# Patient Record
Sex: Female | Born: 1970
Health system: Southern US, Community
[De-identification: ages and names within clinical notes are randomized; demographics above are authoritative.]

## PROBLEM LIST (undated history)

## (undated) DIAGNOSIS — L409 Psoriasis, unspecified: Secondary | ICD-10-CM

## (undated) DIAGNOSIS — I10 Essential (primary) hypertension: Secondary | ICD-10-CM

## (undated) DIAGNOSIS — N938 Other specified abnormal uterine and vaginal bleeding: Secondary | ICD-10-CM

## (undated) DIAGNOSIS — D594 Other nonautoimmune hemolytic anemias: Secondary | ICD-10-CM

## (undated) DIAGNOSIS — R61 Generalized hyperhidrosis: Secondary | ICD-10-CM

## (undated) HISTORY — PX: WISDOM TOOTH EXTRACTION: SHX21

## (undated) HISTORY — DX: Other nonautoimmune hemolytic anemias: D59.4

## (undated) HISTORY — DX: Generalized hyperhidrosis: R61

## (undated) HISTORY — DX: Other specified abnormal uterine and vaginal bleeding: N93.8

## (undated) HISTORY — PX: BREAST EXCISIONAL BIOPSY: SUR124

## (undated) HISTORY — PX: BREAST BIOPSY: SHX20

## (undated) HISTORY — DX: Psoriasis, unspecified: L40.9

---

## 2000-03-11 ENCOUNTER — Other Ambulatory Visit: Admission: RE | Admit: 2000-03-11 | Discharge: 2000-03-11 | Payer: Self-pay | Admitting: Family Medicine

## 2002-02-11 ENCOUNTER — Other Ambulatory Visit: Admission: RE | Admit: 2002-02-11 | Discharge: 2002-02-11 | Payer: Self-pay | Admitting: Family Medicine

## 2002-02-12 ENCOUNTER — Encounter: Admission: RE | Admit: 2002-02-12 | Discharge: 2002-02-12 | Payer: Self-pay | Admitting: Family Medicine

## 2002-02-12 ENCOUNTER — Encounter: Payer: Self-pay | Admitting: Family Medicine

## 2002-02-23 ENCOUNTER — Encounter: Admission: RE | Admit: 2002-02-23 | Discharge: 2002-02-23 | Payer: Self-pay | Admitting: Family Medicine

## 2002-02-23 ENCOUNTER — Encounter: Payer: Self-pay | Admitting: Family Medicine

## 2003-05-27 ENCOUNTER — Other Ambulatory Visit: Admission: RE | Admit: 2003-05-27 | Discharge: 2003-05-27 | Payer: Self-pay | Admitting: Family Medicine

## 2004-06-11 ENCOUNTER — Other Ambulatory Visit: Admission: RE | Admit: 2004-06-11 | Discharge: 2004-06-11 | Payer: Self-pay | Admitting: Family Medicine

## 2004-10-15 ENCOUNTER — Ambulatory Visit: Payer: Self-pay | Admitting: Family Medicine

## 2005-12-24 ENCOUNTER — Ambulatory Visit: Payer: Self-pay | Admitting: Family Medicine

## 2005-12-31 ENCOUNTER — Encounter: Payer: Self-pay | Admitting: Family Medicine

## 2005-12-31 ENCOUNTER — Other Ambulatory Visit: Admission: RE | Admit: 2005-12-31 | Discharge: 2005-12-31 | Payer: Self-pay | Admitting: Family Medicine

## 2005-12-31 ENCOUNTER — Ambulatory Visit: Payer: Self-pay | Admitting: Family Medicine

## 2006-05-15 ENCOUNTER — Ambulatory Visit: Payer: Self-pay | Admitting: Family Medicine

## 2006-12-30 ENCOUNTER — Ambulatory Visit: Payer: Self-pay | Admitting: Family Medicine

## 2006-12-30 LAB — CONVERTED CEMR LAB
ALT: 13 units/L (ref 0–40)
AST: 18 units/L (ref 0–37)
Albumin: 4.1 g/dL (ref 3.5–5.2)
Alkaline Phosphatase: 52 units/L (ref 39–117)
BUN: 10 mg/dL (ref 6–23)
Basophils Absolute: 0 10*3/uL (ref 0.0–0.1)
Basophils Relative: 0.5 % (ref 0.0–1.0)
Bilirubin, Direct: 0.1 mg/dL (ref 0.0–0.3)
CO2: 27 meq/L (ref 19–32)
Calcium: 9.1 mg/dL (ref 8.4–10.5)
Chloride: 108 meq/L (ref 96–112)
Cholesterol: 212 mg/dL (ref 0–200)
Creatinine, Ser: 0.8 mg/dL (ref 0.4–1.2)
Direct LDL: 118.1 mg/dL
Eosinophils Absolute: 0.1 10*3/uL (ref 0.0–0.6)
Eosinophils Relative: 1.7 % (ref 0.0–5.0)
GFR calc Af Amer: 105 mL/min
GFR calc non Af Amer: 87 mL/min
Glucose, Bld: 101 mg/dL — ABNORMAL HIGH (ref 70–99)
HCT: 36.7 % (ref 36.0–46.0)
HDL: 76.2 mg/dL (ref >39.0)
Hemoglobin: 12.9 g/dL (ref 12.0–15.0)
Lymphocytes Relative: 37.3 % (ref 12.0–46.0)
MCHC: 35.2 g/dL (ref 30.0–36.0)
MCV: 93.3 fL (ref 78.0–100.0)
Monocytes Absolute: 0.5 10*3/uL (ref 0.2–0.7)
Monocytes Relative: 13.1 % — ABNORMAL HIGH (ref 3.0–11.0)
Neutro Abs: 2 10*3/uL (ref 1.4–7.7)
Neutrophils Relative %: 47.4 % (ref 43.0–77.0)
Platelets: 276 10*3/uL (ref 150–400)
Potassium: 3.5 meq/L (ref 3.5–5.1)
RBC: 3.94 M/uL (ref 3.87–5.11)
RDW: 12.6 % (ref 11.5–14.6)
Sodium: 143 meq/L (ref 135–145)
TSH: 1.55 microintl units/mL (ref 0.35–5.50)
Total Bilirubin: 0.9 mg/dL (ref 0.3–1.2)
Total CHOL/HDL Ratio: 2.8
Total Protein: 7.1 g/dL (ref 6.0–8.3)
Triglycerides: 51 mg/dL (ref 0–149)
VLDL: 10 mg/dL (ref 0–40)
WBC: 4.1 10*3/uL — ABNORMAL LOW (ref 4.5–10.5)

## 2007-01-14 ENCOUNTER — Other Ambulatory Visit: Admission: RE | Admit: 2007-01-14 | Discharge: 2007-01-14 | Payer: Self-pay | Admitting: Family Medicine

## 2007-01-14 ENCOUNTER — Encounter: Payer: Self-pay | Admitting: Family Medicine

## 2007-01-14 ENCOUNTER — Ambulatory Visit: Payer: Self-pay | Admitting: Family Medicine

## 2007-01-16 ENCOUNTER — Ambulatory Visit (HOSPITAL_COMMUNITY): Admission: RE | Admit: 2007-01-16 | Discharge: 2007-01-16 | Payer: Self-pay | Admitting: Family Medicine

## 2007-01-19 ENCOUNTER — Ambulatory Visit: Payer: Self-pay | Admitting: Family Medicine

## 2008-03-15 ENCOUNTER — Encounter: Payer: Self-pay | Admitting: Family Medicine

## 2008-03-15 ENCOUNTER — Ambulatory Visit: Payer: Self-pay | Admitting: Family Medicine

## 2008-03-15 ENCOUNTER — Other Ambulatory Visit: Admission: RE | Admit: 2008-03-15 | Discharge: 2008-03-15 | Payer: Self-pay | Admitting: Family Medicine

## 2008-03-15 DIAGNOSIS — N949 Unspecified condition associated with female genital organs and menstrual cycle: Secondary | ICD-10-CM | POA: Insufficient documentation

## 2008-03-15 DIAGNOSIS — N938 Other specified abnormal uterine and vaginal bleeding: Secondary | ICD-10-CM | POA: Insufficient documentation

## 2008-03-15 DIAGNOSIS — L258 Unspecified contact dermatitis due to other agents: Secondary | ICD-10-CM | POA: Insufficient documentation

## 2008-03-15 DIAGNOSIS — L308 Other specified dermatitis: Secondary | ICD-10-CM | POA: Insufficient documentation

## 2008-09-23 HISTORY — PX: ACHILLES TENDON REPAIR: SUR1153

## 2009-03-22 ENCOUNTER — Telehealth: Payer: Self-pay | Admitting: *Deleted

## 2009-12-26 ENCOUNTER — Ambulatory Visit: Payer: Self-pay | Admitting: Family Medicine

## 2009-12-26 LAB — CONVERTED CEMR LAB
ALT: 14 units/L (ref 0–35)
AST: 20 units/L (ref 0–37)
Albumin: 4.3 g/dL (ref 3.5–5.2)
Alkaline Phosphatase: 52 units/L (ref 39–117)
BUN: 12 mg/dL (ref 6–23)
Basophils Absolute: 0 10*3/uL (ref 0.0–0.1)
Basophils Relative: 0.8 % (ref 0.0–3.0)
Bilirubin Urine: NEGATIVE
Bilirubin, Direct: 0.1 mg/dL (ref 0.0–0.3)
Blood in Urine, dipstick: 1
CO2: 20 meq/L (ref 19–32)
Calcium: 9 mg/dL (ref 8.4–10.5)
Chloride: 105 meq/L (ref 96–112)
Cholesterol: 222 mg/dL — ABNORMAL HIGH (ref 0–200)
Creatinine, Ser: 0.8 mg/dL (ref 0.4–1.2)
Direct LDL: 137.5 mg/dL
Eosinophils Absolute: 0 10*3/uL (ref 0.0–0.7)
Eosinophils Relative: 0.9 % (ref 0.0–5.0)
GFR calc non Af Amer: 102.83 mL/min (ref >60)
Glucose, Bld: 78 mg/dL (ref 70–99)
Glucose, Urine, Semiquant: NEGATIVE
HCT: 30.4 % — ABNORMAL LOW (ref 36.0–46.0)
HDL: 86.8 mg/dL (ref >39.00)
Hemoglobin: 10.1 g/dL — ABNORMAL LOW (ref 12.0–15.0)
Lymphocytes Relative: 37.8 % (ref 12.0–46.0)
Lymphs Abs: 1.7 10*3/uL (ref 0.7–4.0)
MCHC: 33.2 g/dL (ref 30.0–36.0)
MCV: 82.8 fL (ref 78.0–100.0)
Monocytes Absolute: 0.4 10*3/uL (ref 0.1–1.0)
Monocytes Relative: 9.1 % (ref 3.0–12.0)
Neutro Abs: 2.4 10*3/uL (ref 1.4–7.7)
Neutrophils Relative %: 51.4 % (ref 43.0–77.0)
Nitrite: NEGATIVE
Platelets: 291 10*3/uL (ref 150.0–400.0)
Potassium: 3.9 meq/L (ref 3.5–5.1)
Protein, U semiquant: NEGATIVE
RBC: 3.68 M/uL — ABNORMAL LOW (ref 3.87–5.11)
RDW: 15.5 % — ABNORMAL HIGH (ref 11.5–14.6)
Sodium: 137 meq/L (ref 135–145)
Specific Gravity, Urine: 1.015
TSH: 1.34 microintl units/mL (ref 0.35–5.50)
Total Bilirubin: 0.5 mg/dL (ref 0.3–1.2)
Total CHOL/HDL Ratio: 3
Total Protein: 7.4 g/dL (ref 6.0–8.3)
Triglycerides: 52 mg/dL (ref 0.0–149.0)
Urobilinogen, UA: 0.2
VLDL: 10.4 mg/dL (ref 0.0–40.0)
WBC Urine, dipstick: NEGATIVE
WBC: 4.6 10*3/uL (ref 4.5–10.5)
pH: 7

## 2010-01-02 ENCOUNTER — Ambulatory Visit: Payer: Self-pay | Admitting: Family Medicine

## 2010-01-02 ENCOUNTER — Other Ambulatory Visit: Admission: RE | Admit: 2010-01-02 | Discharge: 2010-01-02 | Payer: Self-pay | Admitting: Family Medicine

## 2010-01-02 LAB — CONVERTED CEMR LAB: Pap Smear: NEGATIVE

## 2010-03-09 ENCOUNTER — Ambulatory Visit: Payer: Self-pay | Admitting: Family Medicine

## 2010-03-09 DIAGNOSIS — R439 Unspecified disturbances of smell and taste: Secondary | ICD-10-CM | POA: Insufficient documentation

## 2010-10-23 NOTE — Assessment & Plan Note (Signed)
Summary: ?allergies//ccm   Vital Signs:  Patient profile:   39 year old female Menstrual status:  regular Weight:      177 pounds Temp:     98.7 degrees F oral BP sitting:   130 / 90  (left arm) Cuff size:   regular  Vitals Entered By: Kern Reap CMA Duncan Dull) (March 09, 2010 11:44 AM) CC: loss of taste and smell   CC:  loss of taste and smell.  History of Present Illness: Joanna Wright is a 39 year old female, who comes in today with a one month history of the inability to smell anything.  A month ago, and she had a flareup in her allergies.  Allergy symptoms have stopped, however, she's not been able to smell anything for one month.  Review of systems negative  Allergies: No Known Drug Allergies  Past History:  Past medical, surgical, family and social histories (including risk factors) reviewed, and no changes noted (except as noted below).  Past Medical History: Reviewed history from 03/15/2008 and no changes required. MHA DUB Night Sweats torn Achilles' tendon, right ankle repair  Past Surgical History: Reviewed history from 06/17/2007 and no changes required. Denies surgical history  Family History: Reviewed history from 06/17/2007 and no changes required. Family History Hypertension  Social History: Reviewed history from 06/17/2007 and no changes required. Occupation: Married Never Smoked Alcohol use-no Drug use-no  Review of Systems      See HPI  Physical Exam  General:  Well-developed,well-nourished,in no acute distress; alert,appropriate and cooperative throughout examination Head:  Normocephalic and atraumatic without obvious abnormalities. No apparent alopecia or balding. Eyes:  No corneal or conjunctival inflammation noted. EOMI. Perrla. Funduscopic exam benign, without hemorrhages, exudates or papilledema. Vision grossly normal. Ears:  External ear exam shows no significant lesions or deformities.  Otoscopic examination reveals clear canals,  tympanic membranes are intact bilaterally without bulging, retraction, inflammation or discharge. Hearing is grossly normal bilaterally. Nose:  External nasal examination shows no deformity or inflammation. Nasal mucosa are pink and moist without lesions or exudates. Mouth:  Oral mucosa and oropharynx without lesions or exudates.  Teeth in good repair. Neck:  No deformities, masses, or tenderness noted.   Impression & Recommendations:  Problem # 1:  ANOSMIA (ICD-781.1) Assessment New  Complete Medication List: 1)  Lidex 0.05 % Crea (Fluocinonide) .... Apply at bedtime  Patient Instructions: 1)  wait for a couple weeks to see if this is not get better on its own.  If you would like a second opinion.  I would recommend Dr. Dillard Cannon ent

## 2010-10-23 NOTE — Assessment & Plan Note (Signed)
Summary: A. a   Vital Signs:  Patient profile:   40 year old female Menstrual status:  regular LMP:     12/23/2009 Height:      64 inches Weight:      179 pounds BMI:     30.84 Temp:     98.9 degrees F oral BP sitting:   110 / 80  (left arm) Cuff size:   regular  Vitals Entered By: Kern Reap CMA Duncan Dull) (January 02, 2010 4:00 PM) CC: cpx Is Patient Diabetic? No Pain Assessment Patient in pain? no      LMP (date): 12/23/2009     Menstrual Status regular Enter LMP: 12/23/2009   CC:  cpx.  History of Present Illness: Annmargaret is a 40 year old, married female, nonsmoker, who comes in today for general physical examination,  She's always been in excellent, health.  She's had no chronic health problems.  Her periods are regular but heavy.  Her birth control.  They use condoms.  Tetanus 2007 seasonal flu 2010  Allergies: No Known Drug Allergies  Past History:  Past medical, surgical, family and social histories (including risk factors) reviewed, and no changes noted (except as noted below).  Past Medical History: Reviewed history from 03/15/2008 and no changes required. MHA DUB Night Sweats torn Achilles' tendon, right ankle repair  Past Surgical History: Reviewed history from 06/17/2007 and no changes required. Denies surgical history  Family History: Reviewed history from 06/17/2007 and no changes required. Family History Hypertension  Social History: Reviewed history from 06/17/2007 and no changes required. Occupation: Married Never Smoked Alcohol use-no Drug use-no  Review of Systems      See HPI  Physical Exam  General:  Well-developed,well-nourished,in no acute distress; alert,appropriate and cooperative throughout examination Head:  Normocephalic and atraumatic without obvious abnormalities. No apparent alopecia or balding. Eyes:  No corneal or conjunctival inflammation noted. EOMI. Perrla. Funduscopic exam benign, without hemorrhages, exudates  or papilledema. Vision grossly normal. Ears:  External ear exam shows no significant lesions or deformities.  Otoscopic examination reveals clear canals, tympanic membranes are intact bilaterally without bulging, retraction, inflammation or discharge. Hearing is grossly normal bilaterally. Nose:  External nasal examination shows no deformity or inflammation. Nasal mucosa are pink and moist without lesions or exudates. Mouth:  Oral mucosa and oropharynx without lesions or exudates.  Teeth in good repair. Neck:  No deformities, masses, or tenderness noted. Chest Wall:  No deformities, masses, or tenderness noted. Breasts:  No mass, nodules, thickening, tenderness, bulging, retraction, inflamation, nipple discharge or skin changes noted.   Lungs:  Normal respiratory effort, chest expands symmetrically. Lungs are clear to auscultation, no crackles or wheezes. Heart:  Normal rate and regular rhythm. S1 and S2 normal without gallop, murmur, click, rub or other extra sounds. Abdomen:  Bowel sounds positive,abdomen soft and non-tender without masses, organomegaly or hernias noted. Genitalia:  Pelvic Exam:        External: normal female genitalia without lesions or masses        Vagina: normal without lesions or masses        Cervix: normal without lesions or masses        Adnexa: normal bimanual exam without masses or fullness        Uterus: normal by palpation        Pap smear: performed Msk:  No deformity or scoliosis noted of thoracic or lumbar spine.   Pulses:  R and L carotid,radial,femoral,dorsalis pedis and posterior tibial pulses are full and equal bilaterally  Extremities:  No clubbing, cyanosis, edema, or deformity noted with normal full range of motion of all joints.   Neurologic:  No cranial nerve deficits noted. Station and gait are normal. Plantar reflexes are down-going bilaterally. DTRs are symmetrical throughout. Sensory, motor and coordinative functions appear intact. Skin:  Intact  without suspicious lesions or rashes Cervical Nodes:  No lymphadenopathy noted Axillary Nodes:  No palpable lymphadenopathy Inguinal Nodes:  No significant adenopathy Psych:  Cognition and judgment appear intact. Alert and cooperative with normal attention span and concentration. No apparent delusions, illusions, hallucinations   Impression & Recommendations:  Problem # 1:  DYSFUNCTIONAL UTERINE BLEEDING (ICD-626.8) Assessment Deteriorated  Problem # 2:  Preventive Health Care (ICD-V70.0) Assessment: Comment Only  Complete Medication List: 1)  Lidex 0.05 % Crea (Fluocinonide) .... Apply at bedtime  Patient Instructions: 1)  take one iron tablet at bedtime for 3 months. 2)  Please schedule a follow-up appointment in 1 year.   Immunization History:  Influenza Immunization History:    Influenza:  historical (06/23/2009)

## 2011-06-13 ENCOUNTER — Other Ambulatory Visit (INDEPENDENT_AMBULATORY_CARE_PROVIDER_SITE_OTHER): Payer: 59

## 2011-06-13 DIAGNOSIS — Z0000 Encounter for general adult medical examination without abnormal findings: Secondary | ICD-10-CM

## 2011-06-13 LAB — POCT URINALYSIS DIPSTICK
Leukocytes, UA: NEGATIVE
Nitrite, UA: POSITIVE
Urobilinogen, UA: 0.2

## 2011-06-13 LAB — BASIC METABOLIC PANEL
CO2: 25 mEq/L (ref 19–32)
Calcium: 8.8 mg/dL (ref 8.4–10.5)
Creatinine, Ser: 0.8 mg/dL (ref 0.4–1.2)

## 2011-06-13 LAB — CBC WITH DIFFERENTIAL/PLATELET
Basophils Absolute: 0 10*3/uL (ref 0.0–0.1)
Basophils Relative: 0.9 % (ref 0.0–3.0)
Eosinophils Absolute: 0 10*3/uL (ref 0.0–0.7)
Lymphocytes Relative: 32.7 % (ref 12.0–46.0)
MCHC: 31.8 g/dL (ref 30.0–36.0)
Neutrophils Relative %: 56.4 % (ref 43.0–77.0)
RBC: 3.69 Mil/uL — ABNORMAL LOW (ref 3.87–5.11)

## 2011-06-13 LAB — HEPATIC FUNCTION PANEL
Alkaline Phosphatase: 44 U/L (ref 39–117)
Bilirubin, Direct: 0.1 mg/dL (ref 0.0–0.3)
Total Bilirubin: 0.5 mg/dL (ref 0.3–1.2)
Total Protein: 7.5 g/dL (ref 6.0–8.3)

## 2011-06-13 LAB — LIPID PANEL
HDL: 98.6 mg/dL (ref >39.00)
Total CHOL/HDL Ratio: 2
VLDL: 6.8 mg/dL (ref 0.0–40.0)

## 2011-06-13 LAB — TSH: TSH: 1.38 u[IU]/mL (ref 0.35–5.50)

## 2011-06-19 ENCOUNTER — Encounter: Payer: Self-pay | Admitting: Family Medicine

## 2011-06-20 ENCOUNTER — Ambulatory Visit (INDEPENDENT_AMBULATORY_CARE_PROVIDER_SITE_OTHER): Payer: 59 | Admitting: Family Medicine

## 2011-06-20 ENCOUNTER — Other Ambulatory Visit (HOSPITAL_COMMUNITY)
Admission: RE | Admit: 2011-06-20 | Discharge: 2011-06-20 | Disposition: A | Discharge status: Home / Self Care | Payer: 59 | Source: Ambulatory Visit | Attending: Family Medicine | Admitting: Family Medicine

## 2011-06-20 ENCOUNTER — Encounter: Payer: Self-pay | Admitting: Family Medicine

## 2011-06-20 DIAGNOSIS — Z01419 Encounter for gynecological examination (general) (routine) without abnormal findings: Secondary | ICD-10-CM | POA: Insufficient documentation

## 2011-06-20 DIAGNOSIS — N949 Unspecified condition associated with female genital organs and menstrual cycle: Secondary | ICD-10-CM

## 2011-06-20 DIAGNOSIS — N938 Other specified abnormal uterine and vaginal bleeding: Secondary | ICD-10-CM

## 2011-06-20 DIAGNOSIS — D649 Anemia, unspecified: Secondary | ICD-10-CM

## 2011-06-20 DIAGNOSIS — Z0001 Encounter for general adult medical examination with abnormal findings: Secondary | ICD-10-CM | POA: Insufficient documentation

## 2011-06-20 DIAGNOSIS — Z0000 Encounter for general adult medical examination without abnormal findings: Secondary | ICD-10-CM | POA: Insufficient documentation

## 2011-06-20 MED ORDER — ETHYNODIOL DIAC-ETH ESTRADIOL 1-50 MG-MCG PO TABS: 1.0000 | ORAL_TABLET | Freq: Every day | ORAL | Status: DC

## 2011-06-20 NOTE — Progress Notes (Signed)
  Subjective:    Patient ID: Joanna Wright, female    DOB: [birth year], 40 y.o.   MRN: [id number]  HPISharma is a delightful 40 year old female, G0, P0, who comes in today for general physical examination she is also a nonsmoker.  She's always been in excellent, health.  She's had no chronic health problems.  She has routine eye care, dental care, BSE mont......... She's never had a mammogram.  Family history negative.  Tetanus booster 2007,  LMP 910, normal birth control ...........so uses condoms    Review of Systems  Constitutional: Negative.   HENT: Negative.   Eyes: Negative.   Respiratory: Negative.   Cardiovascular: Negative.   Gastrointestinal: Negative.   Genitourinary: Negative.   Musculoskeletal: Negative.   Neurological: Negative.   Hematological: Negative.   Psychiatric/Behavioral: Negative.        Objective:   Physical Exam  Constitutional: She appears well-developed and well-nourished.  HENT:  Head: Normocephalic and atraumatic.  Right Ear: External ear normal.  Left Ear: External ear normal.  Nose: Nose normal.  Mouth/Throat: Oropharynx is clear and moist.  Eyes: EOM are normal. Pupils are equal, round, and reactive to light.  Neck: Normal range of motion. Neck supple. No thyromegaly present.  Cardiovascular: Normal rate, regular rhythm, normal heart sounds and intact distal pulses.  Exam reveals no gallop and no friction rub.   No murmur heard. Pulmonary/Chest: Effort normal and breath sounds normal.  Abdominal: Soft. Bowel sounds are normal. She exhibits no distension and no mass. There is no tenderness. There is no rebound.  Genitourinary: Vagina normal and uterus normal. Guaiac negative stool. No vaginal discharge found.       Bilateral breast exam normal  Musculoskeletal: Normal range of motion.  Lymphadenopathy:    She has no cervical adenopathy.  Neurological: She is alert. She has normal reflexes. No cranial nerve deficit. She exhibits normal  muscle tone. Coordination normal.  Skin: Skin is warm and dry.  Psychiatric: She has a normal mood and affect. Her behavior is normal. Judgment and thought content normal.          Assessment & Plan:  Healthy female return yearly sooner if any problems.  Recommend BSE monthly and a new mammography

## 2011-06-20 NOTE — Patient Instructions (Addendum)
Continued good health habits.  Call to schedule your first mammogram.  Return one year For your annual physical examination  Start the BCPs denied.  Take one iron tablet nightly at bedtime for 3 months.  Return in 3 months for follow-up

## 2011-09-26 ENCOUNTER — Ambulatory Visit (INDEPENDENT_AMBULATORY_CARE_PROVIDER_SITE_OTHER): Payer: 59 | Admitting: Family Medicine

## 2011-09-26 ENCOUNTER — Encounter: Payer: Self-pay | Admitting: Family Medicine

## 2011-09-26 DIAGNOSIS — N949 Unspecified condition associated with female genital organs and menstrual cycle: Secondary | ICD-10-CM

## 2011-09-26 NOTE — Progress Notes (Signed)
  Subjective:    Patient ID: Joanna Wright, female    DOB: 1971-09-22, 41 y.o.   MRN: [id number]  HPI Joanna Wright Is a 41 year old female, who comes in today for follow-up of anemia.  We saw her in September for a physical examination at that time.  Her hemoglobin was 9.5.  She had a history of dysfunction uterine bleeding.  Therefore, we start her on low dose birth control pills and iron supplementation.  However, she only took one pill a day for a month and stopped it.  Hemoglobin today up to 10.  No side effects from the BCPs   Review of Systems    General in GYN review of systems otherwise negative Objective:   Physical Exam  Well-developed well-nourished, in no acute distress      Assessment & Plan:  PUD with anemia.  Plan continue BCPs one iron tablet nightly at bedtime for two months recheck CBC in two months

## 2011-09-26 NOTE — Patient Instructions (Signed)
Take one iron tablet at bedtime for two months.  Return in two months for a nonfasting CBC.  I will call you the report

## 2011-11-18 ENCOUNTER — Other Ambulatory Visit (INDEPENDENT_AMBULATORY_CARE_PROVIDER_SITE_OTHER): Payer: 59

## 2011-11-18 DIAGNOSIS — N939 Abnormal uterine and vaginal bleeding, unspecified: Secondary | ICD-10-CM

## 2011-11-18 DIAGNOSIS — N926 Irregular menstruation, unspecified: Secondary | ICD-10-CM

## 2011-11-18 DIAGNOSIS — N949 Unspecified condition associated with female genital organs and menstrual cycle: Secondary | ICD-10-CM

## 2011-11-18 LAB — CBC WITH DIFFERENTIAL/PLATELET
Basophils Absolute: 0 10*3/uL (ref 0.0–0.1)
Eosinophils Absolute: 0.1 10*3/uL (ref 0.0–0.7)
Lymphocytes Relative: 31.9 % (ref 12.0–46.0)
MCHC: 32.7 g/dL (ref 30.0–36.0)
Neutro Abs: 2.6 10*3/uL (ref 1.4–7.7)
Neutrophils Relative %: 54.5 % (ref 43.0–77.0)
RDW: 17.3 % — ABNORMAL HIGH (ref 11.5–14.6)

## 2011-11-25 ENCOUNTER — Ambulatory Visit: Payer: 59 | Admitting: Family Medicine

## 2011-11-28 ENCOUNTER — Ambulatory Visit (INDEPENDENT_AMBULATORY_CARE_PROVIDER_SITE_OTHER): Payer: 59 | Admitting: Family Medicine

## 2011-11-28 ENCOUNTER — Encounter: Payer: Self-pay | Admitting: Family Medicine

## 2011-11-28 VITALS — BP 120/80 | Temp 98.9°F | Wt 169.0 lb

## 2011-11-28 DIAGNOSIS — N949 Unspecified condition associated with female genital organs and menstrual cycle: Secondary | ICD-10-CM

## 2011-11-28 DIAGNOSIS — N939 Abnormal uterine and vaginal bleeding, unspecified: Secondary | ICD-10-CM

## 2011-11-28 NOTE — Progress Notes (Signed)
  Subjective:    Patient ID: Joanna Wright, female    DOB: 04/06/1971, 41 y.o.   MRN: [id number]  HPI Joanna Wright is a 41 year old female nonsmoker who comes in today for followup of dysfunction uterine bleeding with anemia  We saw her 6 months ago at that time her hemoglobin was 9. She is having extremely heavy periods we started him on BCPs and iron today she comes back for followup saying she feels much better. Her hemoglobin now is 11.2 periods now last 3-4 days no side effects from the medication but pressure normal no breast tenderness etc.   Review of Systems General and GYN review of systems otherwise negative    Objective:   Physical Exam Well-developed well-nourished female in no acute distress       Assessment & Plan:  Dysfunction uterine bleeding with anemia controlled with BCPs continue BCPs followup 6 months

## 2011-11-28 NOTE — Patient Instructions (Signed)
Continue your current medication  Set up an appointment in September for your annual exam labs one week prior

## 2012-05-13 ENCOUNTER — Other Ambulatory Visit: Payer: Self-pay | Admitting: Family Medicine

## 2012-06-15 ENCOUNTER — Other Ambulatory Visit: Payer: 59

## 2012-06-22 ENCOUNTER — Other Ambulatory Visit (HOSPITAL_COMMUNITY)
Admission: RE | Admit: 2012-06-22 | Discharge: 2012-06-22 | Disposition: A | Discharge status: Home / Self Care | Payer: 59 | Source: Ambulatory Visit | Attending: Family Medicine | Admitting: Family Medicine

## 2012-06-22 ENCOUNTER — Encounter: Payer: Self-pay | Admitting: Family Medicine

## 2012-06-22 ENCOUNTER — Ambulatory Visit (INDEPENDENT_AMBULATORY_CARE_PROVIDER_SITE_OTHER): Payer: 59 | Admitting: Family Medicine

## 2012-06-22 VITALS — BP 124/84 | Temp 98.3°F | Ht 63.5 in | Wt 173.0 lb

## 2012-06-22 DIAGNOSIS — Z01419 Encounter for gynecological examination (general) (routine) without abnormal findings: Secondary | ICD-10-CM | POA: Insufficient documentation

## 2012-06-22 DIAGNOSIS — L409 Psoriasis, unspecified: Secondary | ICD-10-CM

## 2012-06-22 DIAGNOSIS — N949 Unspecified condition associated with female genital organs and menstrual cycle: Secondary | ICD-10-CM

## 2012-06-22 DIAGNOSIS — L408 Other psoriasis: Secondary | ICD-10-CM

## 2012-06-22 DIAGNOSIS — N925 Other specified irregular menstruation: Secondary | ICD-10-CM

## 2012-06-22 DIAGNOSIS — Z0000 Encounter for general adult medical examination without abnormal findings: Secondary | ICD-10-CM

## 2012-06-22 LAB — CBC WITH DIFFERENTIAL/PLATELET
Basophils Absolute: 0 10*3/uL (ref 0.0–0.1)
Eosinophils Absolute: 0.1 10*3/uL (ref 0.0–0.7)
Eosinophils Relative: 2 % (ref 0.0–5.0)
HCT: 36 % (ref 36.0–46.0)
Hemoglobin: 11.8 g/dL — ABNORMAL LOW (ref 12.0–15.0)
Lymphs Abs: 1.5 10*3/uL (ref 0.7–4.0)
MCHC: 32.8 g/dL (ref 30.0–36.0)
MCV: 92.6 fl (ref 78.0–100.0)
Monocytes Absolute: 0.5 10*3/uL (ref 0.1–1.0)
Neutrophils Relative %: 55.7 % (ref 43.0–77.0)
Platelets: 281 10*3/uL (ref 150.0–400.0)
RBC: 3.89 Mil/uL (ref 3.87–5.11)
RDW: 13.8 % (ref 11.5–14.6)

## 2012-06-22 LAB — TSH: TSH: 1 u[IU]/mL (ref 0.35–5.50)

## 2012-06-22 LAB — POCT URINALYSIS DIPSTICK
Ketones, UA: NEGATIVE
Leukocytes, UA: NEGATIVE
Nitrite, UA: NEGATIVE
Protein, UA: NEGATIVE
Urobilinogen, UA: 0.2
pH, UA: 7.5

## 2012-06-22 LAB — BASIC METABOLIC PANEL
BUN: 12 mg/dL (ref 6–23)
Calcium: 8.5 mg/dL (ref 8.4–10.5)
Chloride: 106 mEq/L (ref 96–112)
Creatinine, Ser: 0.8 mg/dL (ref 0.4–1.2)

## 2012-06-22 MED ORDER — CALCIPOTRIENE 0.005 % EX SOLN: 1.0000 "application " | Freq: Two times a day (BID) | CUTANEOUS | Status: DC

## 2012-06-22 MED ORDER — ETHYNODIOL DIAC-ETH ESTRADIOL 1-50 MG-MCG PO TABS: ORAL_TABLET | ORAL | Status: DC

## 2012-06-22 MED ORDER — FLUOCINONIDE 0.05 % EX CREA: TOPICAL_CREAM | Freq: Two times a day (BID) | CUTANEOUS | Status: DC

## 2012-06-22 NOTE — Progress Notes (Signed)
  Subjective:    Patient ID: Joanna Wright, female    DOB: 16-Jul-1971, 41 y.o.   MRN: [id number]  HPI Windy is a 41 year old married female nonsmoker who comes in today for general physical examination because of a history of dysfunction uterine bleeding and psoriasis  LMP 923 normal. She's on BCPs. She gets routine eye care yearly because her father has a history of glaucoma, regular dental care, BSE monthly, never had a mammogram and instructed her on BSE and to call and get Center for her first mammogram.  She's been seeing a dermatologist for evaluation and treatment of psoriasis. They've been using a combination of Dovonex and a steroid scalp solution. The psoriasis is confined her scalp although she does have some hypopigmentation of the skin around her labia   Review of Systems  Constitutional: Negative.   HENT: Negative.   Eyes: Negative.   Respiratory: Negative.   Cardiovascular: Negative.   Gastrointestinal: Negative.   Genitourinary: Negative.   Musculoskeletal: Negative.   Neurological: Negative.   Hematological: Negative.   Psychiatric/Behavioral: Negative.        Objective:   Physical Exam  Constitutional: She appears well-developed and well-nourished.  HENT:  Head: Normocephalic and atraumatic.  Right Ear: External ear normal.  Left Ear: External ear normal.  Nose: Nose normal.  Mouth/Throat: Oropharynx is clear and moist.  Eyes: EOM are normal. Pupils are equal, round, and reactive to light.  Neck: Normal range of motion. Neck supple. No thyromegaly present.  Cardiovascular: Normal rate, regular rhythm, normal heart sounds and intact distal pulses.  Exam reveals no gallop and no friction rub.   No murmur heard. Pulmonary/Chest: Effort normal and breath sounds normal.  Abdominal: Soft. Bowel sounds are normal. She exhibits no distension and no mass. There is no tenderness. There is no rebound.  Genitourinary: Vagina normal and uterus normal. Guaiac negative  stool. No vaginal discharge found.       Bilateral breast exam normal  Musculoskeletal: Normal range of motion.  Lymphadenopathy:    She has no cervical adenopathy.  Neurological: She is alert. She has normal reflexes. No cranial nerve deficit. She exhibits normal muscle tone. Coordination normal.  Skin: Skin is warm and dry.  Psychiatric: She has a normal mood and affect. Her behavior is normal. Judgment and thought content normal.          Assessment & Plan:  Healthy female  Dysfunction uterine bleeding continue BCPs  Psoriasis of the scalp Dovonex scalp solution and Lidex

## 2012-06-22 NOTE — Patient Instructions (Signed)
Use small amounts of the Dovonex and the steroid cream when necessary for your scalp irritation  Differential thorough breast exam monthly and get set up for your first mammogram  Return in one year sooner if any problems

## 2012-06-25 ENCOUNTER — Telehealth: Payer: Self-pay | Admitting: Family Medicine

## 2012-06-25 NOTE — Telephone Encounter (Signed)
Left message on machine with lab results. 

## 2012-06-25 NOTE — Telephone Encounter (Signed)
Caller: [name]/Patient; Patient Name: Joanna Wright; PCP: Kelle Darting [facility]); Best Callback Phone Number: [phone] Pt states she is returning a call from the office but she could not hear the name and not sure what it was about. Message sent. No notes seen in EPIC

## 2012-10-30 ENCOUNTER — Other Ambulatory Visit: Payer: Self-pay | Admitting: Family Medicine

## 2012-10-30 DIAGNOSIS — Z1231 Encounter for screening mammogram for malignant neoplasm of breast: Secondary | ICD-10-CM

## 2012-11-16 ENCOUNTER — Ambulatory Visit (HOSPITAL_BASED_OUTPATIENT_CLINIC_OR_DEPARTMENT_OTHER)
Admission: RE | Admit: 2012-11-16 | Discharge: 2012-11-16 | Disposition: A | Discharge status: Home / Self Care | Payer: 59 | Source: Ambulatory Visit | Attending: Family Medicine | Admitting: Family Medicine

## 2012-11-16 DIAGNOSIS — Z1231 Encounter for screening mammogram for malignant neoplasm of breast: Secondary | ICD-10-CM

## 2012-11-18 ENCOUNTER — Other Ambulatory Visit: Payer: Self-pay | Admitting: Family Medicine

## 2012-11-18 DIAGNOSIS — R928 Other abnormal and inconclusive findings on diagnostic imaging of breast: Secondary | ICD-10-CM

## 2012-11-30 ENCOUNTER — Ambulatory Visit
Admission: RE | Admit: 2012-11-30 | Discharge: 2012-11-30 | Disposition: A | Discharge status: Home / Self Care | Payer: 59 | Source: Ambulatory Visit | Attending: Family Medicine | Admitting: Family Medicine

## 2012-11-30 DIAGNOSIS — R928 Other abnormal and inconclusive findings on diagnostic imaging of breast: Secondary | ICD-10-CM

## 2013-05-24 ENCOUNTER — Other Ambulatory Visit: Payer: Self-pay | Admitting: Family Medicine

## 2014-02-23 ENCOUNTER — Other Ambulatory Visit (HOSPITAL_COMMUNITY)
Admission: RE | Admit: 2014-02-23 | Discharge: 2014-02-23 | Disposition: A | Discharge status: Home / Self Care | Payer: PRIVATE HEALTH INSURANCE | Source: Ambulatory Visit | Attending: Family Medicine | Admitting: Family Medicine

## 2014-02-23 ENCOUNTER — Ambulatory Visit (INDEPENDENT_AMBULATORY_CARE_PROVIDER_SITE_OTHER): Payer: PRIVATE HEALTH INSURANCE | Admitting: Family Medicine

## 2014-02-23 ENCOUNTER — Encounter: Payer: Self-pay | Admitting: Family Medicine

## 2014-02-23 ENCOUNTER — Other Ambulatory Visit: Payer: Self-pay | Admitting: Family Medicine

## 2014-02-23 VITALS — BP 140/90 | Temp 98.5°F | Ht 63.5 in | Wt 194.0 lb

## 2014-02-23 DIAGNOSIS — Z0000 Encounter for general adult medical examination without abnormal findings: Secondary | ICD-10-CM

## 2014-02-23 DIAGNOSIS — Z01419 Encounter for gynecological examination (general) (routine) without abnormal findings: Secondary | ICD-10-CM | POA: Insufficient documentation

## 2014-02-23 DIAGNOSIS — N949 Unspecified condition associated with female genital organs and menstrual cycle: Secondary | ICD-10-CM

## 2014-02-23 DIAGNOSIS — N852 Hypertrophy of uterus: Secondary | ICD-10-CM

## 2014-02-23 DIAGNOSIS — L409 Psoriasis, unspecified: Secondary | ICD-10-CM

## 2014-02-23 DIAGNOSIS — N925 Other specified irregular menstruation: Secondary | ICD-10-CM

## 2014-02-23 DIAGNOSIS — L408 Other psoriasis: Secondary | ICD-10-CM

## 2014-02-23 DIAGNOSIS — N938 Other specified abnormal uterine and vaginal bleeding: Secondary | ICD-10-CM

## 2014-02-23 LAB — CBC WITH DIFFERENTIAL/PLATELET
BASOS PCT: 0.7 % (ref 0.0–3.0)
Basophils Absolute: 0 10*3/uL (ref 0.0–0.1)
EOS ABS: 0.1 10*3/uL (ref 0.0–0.7)
Eosinophils Relative: 1.7 % (ref 0.0–5.0)
HEMATOCRIT: 30.9 % — AB (ref 36.0–46.0)
HEMOGLOBIN: 10.1 g/dL — AB (ref 12.0–15.0)
LYMPHS ABS: 1.2 10*3/uL (ref 0.7–4.0)
LYMPHS PCT: 31.5 % (ref 12.0–46.0)
MCHC: 32.5 g/dL (ref 30.0–36.0)
MCV: 82.3 fl (ref 78.0–100.0)
Monocytes Absolute: 0.4 10*3/uL (ref 0.1–1.0)
Monocytes Relative: 9.2 % (ref 3.0–12.0)
NEUTROS ABS: 2.2 10*3/uL (ref 1.4–7.7)
Neutrophils Relative %: 56.9 % (ref 43.0–77.0)
Platelets: 386 10*3/uL (ref 150.0–400.0)
RBC: 3.76 Mil/uL — AB (ref 3.87–5.11)
RDW: 16.3 % — AB (ref 11.5–15.5)
WBC: 3.9 10*3/uL — ABNORMAL LOW (ref 4.0–10.5)

## 2014-02-23 LAB — TSH: TSH: 1.12 u[IU]/mL (ref 0.35–4.50)

## 2014-02-23 LAB — BASIC METABOLIC PANEL
BUN: 10 mg/dL (ref 6–23)
CALCIUM: 9 mg/dL (ref 8.4–10.5)
CO2: 27 mEq/L (ref 19–32)
Chloride: 104 mEq/L (ref 96–112)
Creatinine, Ser: 0.7 mg/dL (ref 0.4–1.2)
GFR: 121.5 mL/min (ref >60.00)
Glucose, Bld: 84 mg/dL (ref 70–99)
Potassium: 4 mEq/L (ref 3.5–5.1)
SODIUM: 137 meq/L (ref 135–145)

## 2014-02-23 LAB — POCT URINALYSIS DIPSTICK
BILIRUBIN UA: NEGATIVE
GLUCOSE UA: NEGATIVE
Ketones, UA: NEGATIVE
Nitrite, UA: POSITIVE
PH UA: 7
Protein, UA: NEGATIVE
SPEC GRAV UA: 1.015
UROBILINOGEN UA: 0.2

## 2014-02-23 MED ORDER — FLUOCINONIDE 0.05 % EX CREA: TOPICAL_CREAM | Freq: Two times a day (BID) | CUTANEOUS | Status: DC

## 2014-02-23 MED ORDER — ETHYNODIOL DIAC-ETH ESTRADIOL 1-50 MG-MCG PO TABS: ORAL_TABLET | ORAL | Status: DC

## 2014-02-23 NOTE — Patient Instructions (Signed)
Continue the condoms for birth control  We will get you set up for an ultrasound  I will call you the report  Restart BCPs when necessary

## 2014-02-23 NOTE — Progress Notes (Signed)
   Subjective:    Patient ID: Joanna Wright, female    DOB: 1970-11-29, 43 y.o.   MRN: [id number]  HPI Lindsay is a 43 year old female separated x2 years,,,,,, nonsmoker,,,,,,, who comes in today for general physical examination  She has a history of dysfunction uterine bleeding now she spotting a week before her period starts. LMP May 26 of this first. She's off her BCPs she and her new boyfriend are using condoms.  She gets routine eye care, dental care, BSE monthly, and you mammography, colonoscopy at age 33. No family history of colon cancer or polyps. She uses a steroid gel when necessary for his mom amounts of psoriasis of her scalp.  She was told on her last mammogram she had dense breast tissue advise 3-D mammography in the future.  She works does not exercise on a regular basis   Review of Systems  Constitutional: Negative.   HENT: Negative.   Eyes: Negative.   Respiratory: Negative.   Cardiovascular: Negative.   Gastrointestinal: Negative.   Genitourinary: Negative.   Musculoskeletal: Negative.   Neurological: Negative.   Psychiatric/Behavioral: Negative.        Objective:   Physical Exam  Constitutional: She appears well-developed and well-nourished.  HENT:  Head: Normocephalic and atraumatic.  Right Ear: External ear normal.  Left Ear: External ear normal.  Nose: Nose normal.  Mouth/Throat: Oropharynx is clear and moist.  Eyes: EOM are normal. Pupils are equal, round, and reactive to light.  Neck: Normal range of motion. Neck supple. No thyromegaly present.  Cardiovascular: Normal rate, regular rhythm, normal heart sounds and intact distal pulses.  Exam reveals no gallop and no friction rub.   No murmur heard. Pulmonary/Chest: Effort normal and breath sounds normal.  Abdominal: Soft. Bowel sounds are normal. She exhibits no distension and no mass. There is no tenderness. There is no rebound.  Genitourinary: Vagina normal. Guaiac negative stool. No vaginal  discharge found.  Bilateral breast exam normal  Uterus enlarged question fibroids ultrasound pending  Musculoskeletal: Normal range of motion.  Lymphadenopathy:    She has no cervical adenopathy.  Neurological: She is alert. She has normal reflexes. No cranial nerve deficit. She exhibits normal muscle tone. Coordination normal.  Skin: Skin is warm and dry.  Psychiatric: She has a normal mood and affect. Her behavior is normal. Judgment and thought content normal.          Assessment & Plan:  Healthy female  Dysfunction uterine bleeding with enlarged uterus question fibroids,,,,,,,, diagnostic ultrasound  This psoriasis is scalp Lidex when necessary,

## 2014-02-23 NOTE — Progress Notes (Signed)
Pre visit review using our clinic review tool, if applicable. No additional management support is needed unless otherwise documented below in the visit note. 

## 2014-02-24 LAB — CYTOLOGY - PAP

## 2014-02-25 ENCOUNTER — Other Ambulatory Visit: Payer: Self-pay | Admitting: Family Medicine

## 2014-02-25 DIAGNOSIS — D649 Anemia, unspecified: Secondary | ICD-10-CM

## 2014-02-28 ENCOUNTER — Ambulatory Visit
Admission: RE | Admit: 2014-02-28 | Discharge: 2014-02-28 | Disposition: A | Discharge status: Home / Self Care | Payer: PRIVATE HEALTH INSURANCE | Source: Ambulatory Visit | Attending: Family Medicine | Admitting: Family Medicine

## 2014-02-28 DIAGNOSIS — N852 Hypertrophy of uterus: Secondary | ICD-10-CM

## 2014-02-28 DIAGNOSIS — N949 Unspecified condition associated with female genital organs and menstrual cycle: Secondary | ICD-10-CM

## 2014-02-28 DIAGNOSIS — N925 Other specified irregular menstruation: Secondary | ICD-10-CM

## 2014-02-28 DIAGNOSIS — N938 Other specified abnormal uterine and vaginal bleeding: Secondary | ICD-10-CM

## 2014-03-02 ENCOUNTER — Other Ambulatory Visit: Payer: Self-pay | Admitting: Family Medicine

## 2014-03-02 DIAGNOSIS — R935 Abnormal findings on diagnostic imaging of other abdominal regions, including retroperitoneum: Secondary | ICD-10-CM

## 2014-03-22 ENCOUNTER — Ambulatory Visit (INDEPENDENT_AMBULATORY_CARE_PROVIDER_SITE_OTHER): Payer: PRIVATE HEALTH INSURANCE | Admitting: Internal Medicine

## 2014-03-22 VITALS — BP 126/94 | HR 85 | Temp 98.6°F | Resp 16 | Ht 63.5 in | Wt 192.1 lb

## 2014-03-22 DIAGNOSIS — M25539 Pain in unspecified wrist: Secondary | ICD-10-CM

## 2014-03-22 DIAGNOSIS — M25532 Pain in left wrist: Secondary | ICD-10-CM

## 2014-03-22 MED ORDER — MELOXICAM 15 MG PO TABS: 15.0000 mg | ORAL_TABLET | Freq: Every day | ORAL | Status: DC

## 2014-03-22 NOTE — Progress Notes (Signed)
°  This chart was scribed for Joanna Lin, MD by Eston Mould, ED Scribe. This patient was seen in room Room/bed 5 and the patient's care was started at 9:00 PM. Subjective:    Patient ID: Joanna Wright, female    DOB: Jul 19, 1971, 43 y.o.   MRN: [id number] Chief Complaint  Patient presents with   Hand Injury    Left hand   Hand Injury    HPI Comments: Joanna Wright is a 43 y.o. female who presents to the Emergency Department complaining of L hand pain secondary to an injury. Pt states she has been having pain and moderate swelling to L hand; painful when opening a door, bottle, squeezing a fist or excess activity. She states she was playing with her fiance 1 week ago and he landed on top of her L hand. She is L hand dominant.  Patient Active Problem List   Diagnosis Date Noted   Psoriasis of scalp 06/22/2012   Routine general medical examination at a health care facility 06/20/2011   ANOSMIA 03/09/2010   DYSFUNCTIONAL UTERINE BLEEDING 03/15/2008   CONTACT DERMATITIS&OTH ECZEMA DUE OTH [facility] AGENT 03/15/2008   Current outpatient prescriptions:fluocinonide cream (LIDEX) 0.05 %, Apply topically 2 (two) times daily. Apply small amounts each bedtime, Disp: 60 g, Rfl: 6  Review of Systems  Musculoskeletal: Positive for myalgias.       L hand pain  Skin: Negative for color change.   Objective:   Physical Exam  Nursing note and vitals reviewed. Constitutional: She is oriented to person, place, and time. She appears well-developed and well-nourished. No distress.  HENT:  Head: Normocephalic.  Eyes: EOM are normal. Pupils are equal, round, and reactive to light.  Neck: Neck supple.  Cardiovascular: Normal rate.   Pulmonary/Chest: Effort normal.  Musculoskeletal:  No swelling of the L wrist or hand. No ecchymosis. The thumb has a full ROM without pain. Tender over the extensor tendons in carpal area 2 and 5. Pain with grip. Mild pain in the wrist with extension.    Neurological: She is alert and oriented to person, place, and time.  Psychiatric: She has a normal mood and affect. Her behavior is normal. Thought content normal.   Triage Vitals:BP 126/94   Pulse 85   Temp(Src) 98.6 F (37 C) (Oral)   Resp 16   Ht 5' 3.5" (1.613 m)   Wt 192 lb 2 oz (87.147 kg)   BMI 33.50 kg/m2   SpO2 100%   LMP 03/15/2014 Assessment & Plan:    I have completed the patient encounter in its entirety as documented by the scribe, with editing by me where necessary. Robert P. Laney Pastor, M.D. Wrist pain, acute, left  this contusion injury resulted in swelling that has caused tendinitis in her pain Meds ordered this encounter  Medications   meloxicam (MOBIC) 15 MG tablet    Sig: Take 1 tablet (15 mg total) by mouth daily.    Dispense:  30 tablet    Refill:  0   wrist splint Range of motion exercises Recheck 2 weeks if not well

## 2014-05-13 NOTE — Patient Instructions (Addendum)
   Your procedure is scheduled on:  Thursday, August 27  Enter through the Main Entrance of [facility] at:  6 AM Pick up the phone at the desk and dial [phone] and inform us of your arrival.  Please call this number if you have any problems the morning of surgery: [phone]  Remember: Do not eat or drink after midnight: Wednesday Take these medicines the morning of surgery with a SIP OF WATER:  None  Do not wear jewelry, make-up, or FINGER nail polish No metal in your hair or on your body. Do not wear lotions, powders, perfumes.  You may wear deodorant.  Do not bring valuables to the hospital. Contacts, dentures or bridgework may not be worn into surgery.  Patients discharged on the day of surgery will not be allowed to drive home.  Home with mother Garvin Fila.

## 2014-05-16 ENCOUNTER — Encounter (HOSPITAL_COMMUNITY): Payer: Self-pay

## 2014-05-16 ENCOUNTER — Encounter (HOSPITAL_COMMUNITY)
Admission: RE | Admit: 2014-05-16 | Discharge: 2014-05-16 | Disposition: A | Discharge status: Home / Self Care | Payer: PRIVATE HEALTH INSURANCE | Source: Ambulatory Visit | Attending: Obstetrics & Gynecology | Admitting: Obstetrics & Gynecology

## 2014-05-16 DIAGNOSIS — N920 Excessive and frequent menstruation with regular cycle: Secondary | ICD-10-CM | POA: Diagnosis not present

## 2014-05-16 DIAGNOSIS — N840 Polyp of corpus uteri: Secondary | ICD-10-CM | POA: Diagnosis not present

## 2014-05-16 DIAGNOSIS — N949 Unspecified condition associated with female genital organs and menstrual cycle: Secondary | ICD-10-CM | POA: Diagnosis not present

## 2014-05-16 DIAGNOSIS — N938 Other specified abnormal uterine and vaginal bleeding: Secondary | ICD-10-CM | POA: Diagnosis not present

## 2014-05-16 LAB — CBC
HEMATOCRIT: 30.7 % — AB (ref 36.0–46.0)
Hemoglobin: 10 g/dL — ABNORMAL LOW (ref 12.0–15.0)
MCH: 26.6 pg (ref 26.0–34.0)
MCHC: 32.6 g/dL (ref 30.0–36.0)
MCV: 81.6 fL (ref 78.0–100.0)
Platelets: 389 10*3/uL (ref 150–400)
RBC: 3.76 MIL/uL — AB (ref 3.87–5.11)
RDW: 14.6 % (ref 11.5–15.5)
WBC: 4.3 10*3/uL (ref 4.0–10.5)

## 2014-05-18 NOTE — H&P (Signed)
Joanna Wright is an 43 y.o. female. She has moderately heavy menstrual periods and a endometrial polyp was noted on ultrasound.  Pertinent Gynecological History:  Last pap: normal Date: 02/2014      Past Medical History  Diagnosis Date  . DUB (dysfunctional uterine bleeding)     Hx  . Night sweats     Hx  . Psoriasis of scalp   . MHA (microangiopathic hemolytic anemia)     Hx    Past Surgical History  Procedure Laterality Date  . Right ankle    . Wisdom tooth extraction      Family History  Problem Relation Age of Onset  . Hypertension Other     Social History:  reports that she has never smoked. She has never used smokeless tobacco. She reports that she does not drink alcohol or use illicit drugs.  Allergies: No Known Allergies  No prescriptions prior to admission    Review of Systems  Constitutional: Negative.   Respiratory: Negative.   Cardiovascular: Negative.   Genitourinary: Negative.   Skin: Negative.     There were no vitals taken for this visit. Physical Exam  Constitutional: She appears well-developed.  HENT:  Head: Normocephalic.  Eyes: Pupils are equal, round, and reactive to light.  Neck: Normal range of motion.  Cardiovascular: Normal rate.   Respiratory: Effort normal.  GI: Soft.  Genitourinary: Vagina normal and uterus normal.  Neurological: She is alert.    Assessment/Plan: Abnormal uterine bleeding and ultrasound diagnosis of 1 cm endometrial polyp.  She is admitted for hysteroscopy and polypectomy.  Sharene Butters 05/18/2014, 10:43 PM

## 2014-05-19 ENCOUNTER — Ambulatory Visit (HOSPITAL_COMMUNITY)
Admission: RE | Admit: 2014-05-19 | Discharge: 2014-05-19 | Disposition: A | Discharge status: Home / Self Care | Payer: PRIVATE HEALTH INSURANCE | Source: Ambulatory Visit | Attending: Obstetrics & Gynecology | Admitting: Obstetrics & Gynecology

## 2014-05-19 ENCOUNTER — Encounter (HOSPITAL_COMMUNITY): Payer: Self-pay | Admitting: *Deleted

## 2014-05-19 ENCOUNTER — Encounter (HOSPITAL_COMMUNITY)
Admission: RE | Disposition: A | Discharge status: Home / Self Care | Payer: Self-pay | Source: Ambulatory Visit | Attending: Obstetrics & Gynecology

## 2014-05-19 ENCOUNTER — Ambulatory Visit (HOSPITAL_COMMUNITY): Payer: PRIVATE HEALTH INSURANCE | Admitting: Anesthesiology

## 2014-05-19 ENCOUNTER — Encounter (HOSPITAL_COMMUNITY): Payer: PRIVATE HEALTH INSURANCE | Admitting: Anesthesiology

## 2014-05-19 DIAGNOSIS — N938 Other specified abnormal uterine and vaginal bleeding: Secondary | ICD-10-CM | POA: Insufficient documentation

## 2014-05-19 DIAGNOSIS — N840 Polyp of corpus uteri: Secondary | ICD-10-CM | POA: Insufficient documentation

## 2014-05-19 DIAGNOSIS — N920 Excessive and frequent menstruation with regular cycle: Secondary | ICD-10-CM | POA: Insufficient documentation

## 2014-05-19 DIAGNOSIS — N949 Unspecified condition associated with female genital organs and menstrual cycle: Secondary | ICD-10-CM | POA: Insufficient documentation

## 2014-05-19 HISTORY — PX: HYSTEROSCOPY: SHX211

## 2014-05-19 HISTORY — PX: CERVICAL POLYPECTOMY: SHX88

## 2014-05-19 LAB — PREGNANCY, URINE: Preg Test, Ur: NEGATIVE

## 2014-05-19 SURGERY — HYSTEROSCOPY
Anesthesia: General | Site: Vagina

## 2014-05-19 MED ORDER — DEXAMETHASONE SODIUM PHOSPHATE 10 MG/ML IJ SOLN
INTRAMUSCULAR | Status: DC | PRN
  Administered 2014-05-19: 4 mg via INTRAVENOUS

## 2014-05-19 MED ORDER — LACTATED RINGERS IV SOLN
INTRAVENOUS | Status: DC
  Administered 2014-05-19 (×3): via INTRAVENOUS

## 2014-05-19 MED ORDER — ONDANSETRON HCL 4 MG/2ML IJ SOLN
INTRAMUSCULAR | Status: DC | PRN
  Administered 2014-05-19: 4 mg via INTRAVENOUS

## 2014-05-19 MED ORDER — FENTANYL CITRATE 0.05 MG/ML IJ SOLN
INTRAMUSCULAR | Status: DC | PRN
  Administered 2014-05-19 (×2): 25 ug via INTRAVENOUS
  Administered 2014-05-19: 100 ug via INTRAVENOUS

## 2014-05-19 MED ORDER — PROPOFOL 10 MG/ML IV EMUL
INTRAVENOUS | Status: AC
  Filled 2014-05-19: qty 20

## 2014-05-19 MED ORDER — KETOROLAC TROMETHAMINE 30 MG/ML IJ SOLN
INTRAMUSCULAR | Status: DC | PRN
  Administered 2014-05-19: 30 mg via INTRAVENOUS

## 2014-05-19 MED ORDER — KETOROLAC TROMETHAMINE 30 MG/ML IJ SOLN
INTRAMUSCULAR | Status: AC
  Filled 2014-05-19: qty 1

## 2014-05-19 MED ORDER — FENTANYL CITRATE 0.05 MG/ML IJ SOLN
25.0000 ug | INTRAMUSCULAR | Status: DC | PRN
Start: 2014-05-19 — End: 2014-05-19

## 2014-05-19 MED ORDER — FENTANYL CITRATE 0.05 MG/ML IJ SOLN
INTRAMUSCULAR | Status: AC
  Filled 2014-05-19: qty 5

## 2014-05-19 MED ORDER — PROPOFOL 10 MG/ML IV BOLUS
INTRAVENOUS | Status: DC | PRN
  Administered 2014-05-19: 150 mg via INTRAVENOUS

## 2014-05-19 MED ORDER — KETOROLAC TROMETHAMINE 30 MG/ML IJ SOLN: 15.0000 mg | Freq: Once | INTRAMUSCULAR | Status: DC | PRN

## 2014-05-19 MED ORDER — GLYCINE 1.5 % IR SOLN
Status: DC | PRN
  Administered 2014-05-19: 3000 mL

## 2014-05-19 MED ORDER — PROMETHAZINE HCL 25 MG/ML IJ SOLN: 6.2500 mg | INTRAMUSCULAR | Status: DC | PRN

## 2014-05-19 MED ORDER — MIDAZOLAM HCL 2 MG/2ML IJ SOLN
INTRAMUSCULAR | Status: AC
  Filled 2014-05-19: qty 2

## 2014-05-19 MED ORDER — LIDOCAINE HCL 2 % IJ SOLN
INTRAMUSCULAR | Status: AC
  Filled 2014-05-19: qty 20

## 2014-05-19 MED ORDER — LIDOCAINE HCL (CARDIAC) 20 MG/ML IV SOLN
INTRAVENOUS | Status: DC | PRN
  Administered 2014-05-19: 80 mg via INTRAVENOUS

## 2014-05-19 MED ORDER — SCOPOLAMINE 1 MG/3DAYS TD PT72
MEDICATED_PATCH | TRANSDERMAL | Status: AC
  Administered 2014-05-19: 1.5 mg via TRANSDERMAL
  Filled 2014-05-19: qty 1

## 2014-05-19 MED ORDER — MIDAZOLAM HCL 2 MG/2ML IJ SOLN
INTRAMUSCULAR | Status: DC | PRN
  Administered 2014-05-19: 2 mg via INTRAVENOUS

## 2014-05-19 MED ORDER — SCOPOLAMINE 1 MG/3DAYS TD PT72
1.0000 | MEDICATED_PATCH | Freq: Once | TRANSDERMAL | Status: DC
  Administered 2014-05-19: 1.5 mg via TRANSDERMAL

## 2014-05-19 MED ORDER — LIDOCAINE HCL (CARDIAC) 20 MG/ML IV SOLN
INTRAVENOUS | Status: AC
  Filled 2014-05-19: qty 5

## 2014-05-19 MED ORDER — MEPERIDINE HCL 25 MG/ML IJ SOLN
6.2500 mg | INTRAMUSCULAR | Status: DC | PRN
Start: 2014-05-19 — End: 2014-05-19

## 2014-05-19 MED ORDER — DEXAMETHASONE SODIUM PHOSPHATE 4 MG/ML IJ SOLN
INTRAMUSCULAR | Status: AC
Start: 2014-05-19 — End: 2014-05-19
  Filled 2014-05-19: qty 1

## 2014-05-19 SURGICAL SUPPLY — 15 items
CANISTER SUCT 3000ML (MISCELLANEOUS) ×3 IMPLANT
CATH ROBINSON RED A/P 16FR (CATHETERS) ×3 IMPLANT
CLOTH BEACON ORANGE TIMEOUT ST (SAFETY) ×3 IMPLANT
CONTAINER PREFILL 10% NBF 60ML (FORM) ×9 IMPLANT
DRAPE HYSTEROSCOPY (DRAPE) ×3 IMPLANT
DRSG TELFA 3X8 NADH (GAUZE/BANDAGES/DRESSINGS) ×3 IMPLANT
GLOVE ECLIPSE 6.0 STRL STRAW (GLOVE) ×6 IMPLANT
GOWN STRL REUS W/TWL LRG LVL3 (GOWN DISPOSABLE) ×6 IMPLANT
PACK VAGINAL MINOR WOMEN LF (CUSTOM PROCEDURE TRAY) ×3 IMPLANT
PAD OB MATERNITY 4.3X12.25 (PERSONAL CARE ITEMS) ×3 IMPLANT
PAD PREP 24X48 CUFFED NSTRL (MISCELLANEOUS) ×3 IMPLANT
SET TUBING HYSTEROSCOPY 2 NDL (TUBING) ×3 IMPLANT
TOWEL OR 17X24 6PK STRL BLUE (TOWEL DISPOSABLE) ×6 IMPLANT
TUBE HYSTEROSCOPY W Y-CONNECT (TUBING) ×3 IMPLANT
WATER STERILE IRR 1000ML POUR (IV SOLUTION) ×3 IMPLANT

## 2014-05-19 NOTE — Anesthesia Preprocedure Evaluation (Signed)
Anesthesia Evaluation  Patient identified by MRN, date of birth, ID band Patient awake    Reviewed: Allergy & Precautions, H&P , NPO status , Patient's Chart, lab work & pertinent test results  Airway Mallampati: I TM Distance: >3 FB Neck ROM: full    Dental no notable dental hx. (+) Teeth Intact   Pulmonary neg pulmonary ROS,    Pulmonary exam normal       Cardiovascular negative cardio ROS      Neuro/Psych negative neurological ROS  negative psych ROS   GI/Hepatic negative GI ROS, Neg liver ROS,   Endo/Other  negative endocrine ROS  Renal/GU negative Renal ROS     Musculoskeletal   Abdominal Normal abdominal exam  (+)   Peds  Hematology   Anesthesia Other Findings   Reproductive/Obstetrics negative OB ROS                           Anesthesia Physical Anesthesia Plan  ASA: II  Anesthesia Plan: General   Post-op Pain Management:    Induction: Intravenous  Airway Management Planned: LMA  Additional Equipment:   Intra-op Plan:   Post-operative Plan:   Informed Consent: I have reviewed the patients History and Physical, chart, labs and discussed the procedure including the risks, benefits and alternatives for the proposed anesthesia with the patient or authorized representative who has indicated his/her understanding and acceptance.     Plan Discussed with: CRNA, Surgeon and Anesthesiologist  Anesthesia Plan Comments:         Anesthesia Quick Evaluation

## 2014-05-19 NOTE — Anesthesia Procedure Notes (Signed)
Procedure Name: LMA Insertion Date/Time: 05/19/2014 7:30 AM Performed by: Lexus Shampine, Sheron Nightingale Pre-anesthesia Checklist: Patient identified, Patient being monitored, Emergency Drugs available, Timeout performed and Suction available Patient Re-evaluated:Patient Re-evaluated prior to inductionOxygen Delivery Method: Circle system utilized, Nasal cannula, Simple face mask, Ambu bag and Non-rebreather mask Preoxygenation: Pre-oxygenation with 100% oxygen Intubation Type: IV induction LMA: LMA inserted LMA Size: 4.0 Tube size: 4.5 mm Number of attempts: 1 ETT to lip (cm): at lip. Dental Injury: Teeth and Oropharynx as per pre-operative assessment

## 2014-05-19 NOTE — Progress Notes (Signed)
I have interviewed and performed the pertinent exams on my patient to confirm that there have been no significant changes in her condition since the dictation of her history and physical exam.  

## 2014-05-19 NOTE — Op Note (Signed)
Patient Name: REATHA SUR MRN: [id number]  Date of Surgery: 05/19/2014    PREOPERATIVE DIAGNOSIS: Endometrial Polyp  POSTOPERATIVE DIAGNOSIS: Endometrial Polyp   PROCEDURE: Hysteroscopy, Polypectomy, Endometrial curettage  SURGEON: Delfino Lovett D. Deatra Ina M.D.  ANESTHESIA: General   ESTIMATED BLOOD LOSS: Minimal  FINDINGS: 3 small endometrial polyps   INDICATIONS: Endometrial polyp identified on ultrasound.  Menorrhagia  PROCEDURE IN DETAIL: The patient was taken to the OR and placed in the dors-lithotomy position. The perineum and vagina were prepped and draped in a sterile fashion. Bimanual exam revealed an anteverted, normal sized uterus. A 4 mm cervical polyp was removed.  Pratt dilators were used to open the cervix to 21 [place].  The hysteroscope was introduced and both tubal ostia were identified.  3 polyps, all less than 1 cm were identified.  The hysteroscope was removed and the polyps were grasped and removed with polyp forceps.  A sharpe endometrial curettage was then performed.  The hysteroscope was reintroduced and confirmed that the polyps were successfully removed.  Specimens consisting of cervical polyp, endometrial polyps, and endometrial curettings were sent for pathological evaluation.  The procedure was then terminated and the patient left the operating room in good condition.

## 2014-05-19 NOTE — Discharge Instructions (Signed)
Hysteroscopy, Care After Refer to this sheet in the next few weeks. These instructions provide you with information on caring for yourself after your procedure. Your health care provider may also give you more specific instructions. Your treatment has been planned according to current medical practices, but problems sometimes occur. Call your health care provider if you have any problems or questions after your procedure.  WHAT TO EXPECT AFTER THE PROCEDURE After your procedure, it is typical to have the following:  You may have some cramping. This normally lasts for a couple days.  You may have bleeding. This can vary from light spotting for a few days to menstrual-like bleeding for 3-7 days. HOME CARE INSTRUCTIONS  Rest for the first 1-2 days after the procedure.  Only take over-the-counter or prescription medicines as directed by your health care provider. Do not take aspirin. It can increase the chances of bleeding.  Take showers instead of baths for 2 weeks or as directed by your health care provider.  Do not drive for 24 hours or as directed.  Do not drink alcohol while taking pain medicine.  Do not use tampons, douche, or have sexual intercourse for 1 week.   If you develop constipation, you may:  Take a mild laxative.  Add bran foods to your diet.  Drink enough fluids to keep your urine clear or pale yellow.  Try to have someone with you or available to you for the first 24-48 hours, especially if you were given a general anesthetic.  Follow up with your health care provider as directed. SEEK MEDICAL CARE IF:  You feel dizzy or lightheaded.  You feel sick to your stomach (nauseous).  You have abnormal vaginal discharge.  You have a rash.  You have pain that is not controlled with medicine. SEEK IMMEDIATE MEDICAL CARE IF:  You have bleeding that is heavier than a normal menstrual period.  You have a fever.  You have increasing cramps or pain, not controlled  with medicine.  You have new belly (abdominal) pain.  You pass out.  You have pain in the tops of your shoulders (shoulder strap areas).  You have shortness of breath.  MAY TAKE IBUPROFEN (MOTRIN, ADVIL) OR ALEVE AFTER 2:00 PM FOR CRAMPS!!!

## 2014-05-19 NOTE — Transfer of Care (Signed)
Immediate Anesthesia Transfer of Care Note  Patient: Joanna Wright  Procedure(s) Performed: Procedure(s): HYSTEROSCOPY (N/A) CERVICAL POLYPECTOMY (N/A)  Patient Location: PACU  Anesthesia Type:General  Level of Consciousness: awake, alert  and oriented  Airway & Oxygen Therapy: Patient Spontanous Breathing and Patient connected to nasal cannula oxygen  Post-op Assessment: Report given to PACU RN and Post -op Vital signs reviewed and stable  Post vital signs: Reviewed and stable  Complications: No apparent anesthesia complications

## 2014-05-21 ENCOUNTER — Encounter (HOSPITAL_COMMUNITY): Payer: Self-pay | Admitting: Obstetrics & Gynecology

## 2014-05-24 NOTE — Anesthesia Postprocedure Evaluation (Signed)
  Anesthesia Post Note  Patient: Joanna Wright  Procedure(s) Performed: Procedure(s) (LRB): HYSTEROSCOPY (N/A) CERVICAL POLYPECTOMY (N/A)  Anesthesia type: GA  Patient location: PACU  Post pain: Pain level controlled  Post assessment: Post-op Vital signs reviewed  Last Vitals:  Filed Vitals:   05/19/14 0915  BP: 156/68  Pulse: 77  Temp: 36.5 C  Resp: 12    Post vital signs: Reviewed  Level of consciousness: sedated  Complications: No apparent anesthesia complications  * per chart review

## 2014-12-28 ENCOUNTER — Other Ambulatory Visit: Payer: Self-pay | Admitting: Family Medicine

## 2014-12-28 DIAGNOSIS — Z1239 Encounter for other screening for malignant neoplasm of breast: Secondary | ICD-10-CM

## 2014-12-30 ENCOUNTER — Ambulatory Visit (HOSPITAL_BASED_OUTPATIENT_CLINIC_OR_DEPARTMENT_OTHER)
Admission: RE | Admit: 2014-12-30 | Discharge: 2014-12-30 | Disposition: A | Discharge status: Home / Self Care | Payer: PRIVATE HEALTH INSURANCE | Source: Ambulatory Visit | Attending: Family Medicine | Admitting: Family Medicine

## 2014-12-30 DIAGNOSIS — Z1231 Encounter for screening mammogram for malignant neoplasm of breast: Secondary | ICD-10-CM | POA: Insufficient documentation

## 2014-12-30 DIAGNOSIS — Z1239 Encounter for other screening for malignant neoplasm of breast: Secondary | ICD-10-CM

## 2015-01-02 ENCOUNTER — Inpatient Hospital Stay (HOSPITAL_BASED_OUTPATIENT_CLINIC_OR_DEPARTMENT_OTHER): Admission: RE | Admit: 2015-01-02 | Payer: PRIVATE HEALTH INSURANCE | Source: Ambulatory Visit

## 2015-02-13 ENCOUNTER — Encounter: Payer: Self-pay | Admitting: Family

## 2015-02-13 ENCOUNTER — Ambulatory Visit (INDEPENDENT_AMBULATORY_CARE_PROVIDER_SITE_OTHER): Payer: PRIVATE HEALTH INSURANCE | Admitting: Family

## 2015-02-13 VITALS — BP 130/82 | HR 88 | Temp 98.8°F | Resp 16 | Ht 64.0 in | Wt 196.0 lb

## 2015-02-13 DIAGNOSIS — N938 Other specified abnormal uterine and vaginal bleeding: Secondary | ICD-10-CM

## 2015-02-13 DIAGNOSIS — D649 Anemia, unspecified: Secondary | ICD-10-CM

## 2015-02-13 DIAGNOSIS — I10 Essential (primary) hypertension: Secondary | ICD-10-CM | POA: Diagnosis not present

## 2015-02-13 LAB — CBC WITH DIFFERENTIAL/PLATELET
BASOS ABS: 0 10*3/uL (ref 0.0–0.1)
Basophils Relative: 1 % (ref 0.0–3.0)
EOS ABS: 0.1 10*3/uL (ref 0.0–0.7)
Eosinophils Relative: 1.7 % (ref 0.0–5.0)
HEMATOCRIT: 29.4 % — AB (ref 36.0–46.0)
HEMOGLOBIN: 9.5 g/dL — AB (ref 12.0–15.0)
Lymphocytes Relative: 29.1 % (ref 12.0–46.0)
Lymphs Abs: 1.3 10*3/uL (ref 0.7–4.0)
MCHC: 32.3 g/dL (ref 30.0–36.0)
MCV: 77.9 fl — AB (ref 78.0–100.0)
Monocytes Absolute: 0.5 10*3/uL (ref 0.1–1.0)
Monocytes Relative: 11.5 % (ref 3.0–12.0)
NEUTROS ABS: 2.5 10*3/uL (ref 1.4–7.7)
NEUTROS PCT: 56.7 % (ref 43.0–77.0)
PLATELETS: 381 10*3/uL (ref 150.0–400.0)
RBC: 3.77 Mil/uL — ABNORMAL LOW (ref 3.87–5.11)
RDW: 17.5 % — AB (ref 11.5–15.5)
WBC: 4.4 10*3/uL (ref 4.0–10.5)

## 2015-02-13 LAB — BASIC METABOLIC PANEL
BUN: 9 mg/dL (ref 6–23)
CO2: 22 meq/L (ref 19–32)
CREATININE: 0.73 mg/dL (ref 0.40–1.20)
Calcium: 8.8 mg/dL (ref 8.4–10.5)
Chloride: 105 mEq/L (ref 96–112)
GFR: 111.44 mL/min (ref >60.00)
GLUCOSE: 93 mg/dL (ref 70–99)
POTASSIUM: 3.9 meq/L (ref 3.5–5.1)
SODIUM: 137 meq/L (ref 135–145)

## 2015-02-13 NOTE — Assessment & Plan Note (Signed)
Will obtain cbc to evaluate.

## 2015-02-13 NOTE — Patient Instructions (Signed)
Please complete lab work prior to leaving. Schedule fasting physical at the front desk. Welcome to [facility] in [place]!

## 2015-02-13 NOTE — Progress Notes (Signed)
Subjective:    Patient ID: Joanna Wright, female    DOB: Nov 08, 1970, 44 y.o.   MRN: [id number]  HPI  [name] is a 44 yr old female who presents today to establish care. She was previously followed by Dr. Stevie Kern at our [facility] office but hasn't been seen in a   DUB- reports periods are 5 days, regular.  Still having heavy periods. She is followed by Dr. Deatra Ina GYN.  Anemia- reports that she has not had checked in a while.  Denies fatigue.  Denies PICA.  History of hypertension-  Currently on hctz.  This was being filled by urgent care.  BP Readings from Last 3 Encounters:  02/13/15 130/82  05/19/14 156/68  03/22/14 126/94     Review of Systems  Constitutional: Negative for unexpected weight change.  HENT: Negative for hearing loss and rhinorrhea.   Eyes: Negative for visual disturbance.  Respiratory: Negative for cough and shortness of breath.   Cardiovascular: Negative for chest pain and leg swelling.  Gastrointestinal: Negative for nausea, diarrhea and constipation.  Genitourinary: Positive for menstrual problem. Negative for dysuria and frequency.  Musculoskeletal: Negative for myalgias and arthralgias.  Skin: Negative for rash.  Neurological: Negative for headaches.  Hematological: Negative for adenopathy.  Psychiatric/Behavioral: Negative for dysphoric mood and agitation.       Past Medical History  Diagnosis Date  . DUB (dysfunctional uterine bleeding)     Hx  . Night sweats     Hx  . Psoriasis of scalp   . MHA (microangiopathic hemolytic anemia)     Hx    History   Social History  . Marital Status: Legally Separated    Spouse Name: N/A  . Number of Children: N/A  . Years of Education: N/A   Occupational History  . Not on file.   Social History Main Topics  . Smoking status: Never Smoker   . Smokeless tobacco: Never Used  . Alcohol Use: No  . Drug Use: No  . Sexual Activity: Yes    Birth Control/ Protection: Condom   Other Topics  Concern  . Not on file   Social History Narrative   Recently remarried   No children   Works in [employer]   Completed [place] up in [place]   Enjoys sleeping and shopping   Has a pitbull       Past Surgical History  Procedure Laterality Date  . Achilles tendon repair Right 2010  . Wisdom tooth extraction    . Hysteroscopy N/A 05/19/2014    Procedure: HYSTEROSCOPY;  Surgeon: Sharene Butters, MD;  Location: [facility];  Service: Gynecology;  Laterality: N/A;  . Cervical polypectomy N/A 05/19/2014    Procedure: CERVICAL POLYPECTOMY;  Surgeon: Sharene Butters, MD;  Location: [facility];  Service: Gynecology;  Laterality: N/A;    Family History  Problem Relation Age of Onset  . Hypertension Other   . Hypertension Mother   . Glaucoma Father   . Cancer Neg Hx   . Kidney disease Neg Hx   . Diabetes Neg Hx     No Known Allergies  No current outpatient prescriptions on file prior to visit.   No current facility-administered medications on file prior to visit.    BP 130/82 mmHg  Pulse 88  Temp(Src) 98.8 F (37.1 C) (Oral)  Resp 16  Ht 5\' 4"  (1.626 m)  Wt 196 lb (88.905 kg)  BMI 33.63 kg/m2    Objective:  Physical Exam  Constitutional: She is oriented to person, place, and time. She appears well-developed and well-nourished.  HENT:  Head: Normocephalic and atraumatic.  Right Ear: Tympanic membrane and ear canal normal.  Left Ear: Tympanic membrane and ear canal normal.  Mouth/Throat: No oropharyngeal exudate, posterior oropharyngeal edema or posterior oropharyngeal erythema.  Eyes: No scleral icterus.  Neck: No thyromegaly present.  Cardiovascular: Normal rate, regular rhythm and normal heart sounds.   No murmur heard. Pulmonary/Chest: Effort normal and breath sounds normal. No respiratory distress. She has no wheezes.  Lymphadenopathy:    She has no cervical adenopathy.  Neurological: She is alert and oriented to person, place, and time.  Skin: Skin is warm and  dry.  Psychiatric: She has a normal mood and affect. Her behavior is normal. Judgment and thought content normal.          Assessment & Plan:

## 2015-02-13 NOTE — Progress Notes (Signed)
Pre visit review using our clinic review tool, if applicable. No additional management support is needed unless otherwise documented below in the visit note.,  

## 2015-02-13 NOTE — Assessment & Plan Note (Signed)
Stable on hctz, continue same. Obtain bmet to assess renal function and electrolytes.

## 2015-02-13 NOTE — Assessment & Plan Note (Signed)
Pt reports that she had cervical polypectomy per GYN, but she continues to have heavy menstrual bleeding.

## 2015-02-15 ENCOUNTER — Other Ambulatory Visit (INDEPENDENT_AMBULATORY_CARE_PROVIDER_SITE_OTHER): Payer: PRIVATE HEALTH INSURANCE

## 2015-02-15 ENCOUNTER — Telehealth: Payer: Self-pay | Admitting: *Deleted

## 2015-02-15 DIAGNOSIS — D649 Anemia, unspecified: Secondary | ICD-10-CM

## 2015-02-15 LAB — IRON: IRON: 25 ug/dL — AB (ref 42–145)

## 2015-02-15 NOTE — Telephone Encounter (Signed)
-----   Message from [name], NP sent at 02/15/2015  2:59 PM EDT ----- Please ask lab to add on serum iron, dx anemia. Advise pt to start iron 325mg  twice daily and repeat cbc in 6 weeks, dx anemia.  I would advise her to follow back up with her GYN for further evaluation of her heavy menstrual bleeding since she is so anemia. Also, she should complete IFOB, dx anemia.

## 2015-02-15 NOTE — Telephone Encounter (Signed)
Add on form faxed to the lab. Left message for pt to return my call.

## 2015-02-15 NOTE — Telephone Encounter (Signed)
Notified pt and she voices understanding. IFOB order placed and kit left at front desk for pick up. Pt has f/u with PCP on 03/13/15 and will repeat CBC at that visit.

## 2015-02-17 ENCOUNTER — Other Ambulatory Visit: Payer: Self-pay | Admitting: Family

## 2015-02-17 MED ORDER — IRON 325 (65 FE) MG PO TABS: 1.0000 | ORAL_TABLET | Freq: Two times a day (BID) | ORAL | Status: DC

## 2015-02-22 ENCOUNTER — Telehealth: Payer: Self-pay | Admitting: Family

## 2015-02-22 NOTE — Telephone Encounter (Signed)
Pre Visit letter sent  °

## 2015-03-10 ENCOUNTER — Telehealth: Payer: Self-pay | Admitting: Behavioral Health

## 2015-03-10 ENCOUNTER — Encounter: Payer: Self-pay | Admitting: Behavioral Health

## 2015-03-10 NOTE — Telephone Encounter (Signed)
Pre-visit call completed. Information updated; documented under the Specialty comments.

## 2015-03-10 NOTE — Telephone Encounter (Signed)
Unable to reach patient at time of Pre-Visit Call.  Left message for patient to return call when available.    

## 2015-03-10 NOTE — Addendum Note (Signed)
Addended by: Kathlen Brunswick on: 03/10/2015 04:33 PM   Modules accepted: Medications

## 2015-03-10 NOTE — Telephone Encounter (Signed)
Patient returned phone call. Best [phone] ext [phone]

## 2015-03-13 ENCOUNTER — Ambulatory Visit (INDEPENDENT_AMBULATORY_CARE_PROVIDER_SITE_OTHER): Payer: PRIVATE HEALTH INSURANCE | Admitting: Family

## 2015-03-13 ENCOUNTER — Encounter: Payer: Self-pay | Admitting: Family

## 2015-03-13 VITALS — BP 110/80 | HR 86 | Temp 98.2°F | Resp 16 | Ht 64.0 in | Wt 197.2 lb

## 2015-03-13 DIAGNOSIS — R8290 Unspecified abnormal findings in urine: Secondary | ICD-10-CM

## 2015-03-13 DIAGNOSIS — Z0000 Encounter for general adult medical examination without abnormal findings: Secondary | ICD-10-CM

## 2015-03-13 DIAGNOSIS — R82998 Other abnormal findings in urine: Secondary | ICD-10-CM

## 2015-03-13 DIAGNOSIS — R319 Hematuria, unspecified: Secondary | ICD-10-CM | POA: Diagnosis not present

## 2015-03-13 DIAGNOSIS — D649 Anemia, unspecified: Secondary | ICD-10-CM | POA: Diagnosis not present

## 2015-03-13 DIAGNOSIS — N390 Urinary tract infection, site not specified: Secondary | ICD-10-CM

## 2015-03-13 NOTE — Addendum Note (Signed)
Addended by: Harl Bowie on: 03/13/2015 05:19 PM   Modules accepted: Orders

## 2015-03-13 NOTE — Assessment & Plan Note (Signed)
Discussed healthy diet, exercise, weight loss. Obtain routine labs.

## 2015-03-13 NOTE — Progress Notes (Signed)
Pre visit review using our clinic review tool, if applicable. No additional management support is needed unless otherwise documented below in the visit note. 

## 2015-03-13 NOTE — Patient Instructions (Addendum)
Please complete lab work prior to leaving. Complete stool kit and return at your earliest convenience. Arrange follow up with Dr. Deatra Ina to discuss heavy menstrual bleeding. Continue iron. Schedule dental visit. Continue to work on [employer], exercise, weight loss.  Follow up in 6 months.

## 2015-03-13 NOTE — Progress Notes (Signed)
Subjective:    Patient ID: Joanna Wright, female    DOB: Aug 10, 1971, 44 y.o.   MRN: [id number]  HPI   [name] is a 44 yr old female who presents today for cpx.  Immunizations: up to date Diet:not doing well with diet.   Wt Readings from Last 3 Encounters:  03/13/15 197 lb 3.2 oz (89.449 kg)  02/13/15 196 lb (88.905 kg)  05/16/14 197 lb (89.359 kg)  Exercise:plans to join the gym.  Pap Smear: 6/15- normal per pt.  Dr. Sherren Wright performed.   Dental:  Due for follow up. Eye exam:  4/15  Of note, she was noted to have anemia- Hgb 9.5 back in May.  Hemoglobin was low. She is taking iron. Reports menses still heavy. Last visit with Dr. Deatra Wright was 8/16.    Review of Systems  Constitutional: Negative for unexpected weight change.  HENT: Negative for hearing loss and rhinorrhea.   Eyes: Negative for visual disturbance.  Respiratory: Negative for cough.   Cardiovascular: Negative for leg swelling.  Gastrointestinal: Negative for nausea, diarrhea, constipation and blood in stool.  Genitourinary: Negative for dysuria, frequency and hematuria.  Musculoskeletal: Negative for myalgias and arthralgias.  Skin: Negative for rash.  Neurological: Negative for headaches.  Hematological: Negative for adenopathy.  Psychiatric/Behavioral: Negative for dysphoric mood and agitation.   Past Medical History  Diagnosis Date  . DUB (dysfunctional uterine bleeding)     Hx  . Night sweats     Hx  . Psoriasis of scalp   . MHA (microangiopathic hemolytic anemia)     Hx    History   Social History  . Marital Status: Legally Separated    Spouse Name: N/A  . Number of Children: N/A  . Years of Education: N/A   Occupational History  . Not on file.   Social History Main Topics  . Smoking status: Never Smoker   . Smokeless tobacco: Never Used  . Alcohol Use: No  . Drug Use: No  . Sexual Activity: Yes    Birth Control/ Protection: Condom   Other Topics Concern  . Not on file   Social  History Narrative   Recently remarried   No children   Works in [employer]   Completed [employer] up in [place]   Enjoys sleeping and shopping   Has a pitbull       Past Surgical History  Procedure Laterality Date  . Achilles tendon repair Right 2010  . Wisdom tooth extraction    . Hysteroscopy N/A 05/19/2014    Procedure: HYSTEROSCOPY;  Surgeon: Joanna Butters, MD;  Location: [facility];  Service: Gynecology;  Laterality: N/A;  . Cervical polypectomy N/A 05/19/2014    Procedure: CERVICAL POLYPECTOMY;  Surgeon: Joanna Butters, MD;  Location: [facility];  Service: Gynecology;  Laterality: N/A;    Family History  Problem Relation Age of Onset  . Hypertension Other   . Hypertension Mother   . Glaucoma Father   . Cancer Neg Hx   . Kidney disease Neg Hx   . Diabetes Neg Hx     No Known Allergies  Current Outpatient Prescriptions on File Prior to Visit  Medication Sig Dispense Refill  . Ferrous Sulfate (IRON) 325 (65 FE) MG TABS Take 1 tablet by mouth 2 (two) times daily. 30 each 0  . hydrochlorothiazide (HYDRODIURIL) 25 MG tablet Take 25 mg by mouth daily.     No current facility-administered medications on file prior to visit.  BP 110/80 mmHg  Pulse 86  Temp(Src) 98.2 F (36.8 C) (Oral)  Resp 16  Ht 5\' 4"  (1.626 m)  Wt 197 lb 3.2 oz (89.449 kg)  BMI 33.83 kg/m2  SpO2 99%  LMP 03/02/2015       Objective:   Physical Exam  Physical Exam  Constitutional: She is oriented to person, place, and time. She appears well-developed and well-nourished. No distress.  HENT:  Head: Normocephalic and atraumatic.  Right Ear: Tympanic membrane and ear canal normal.  Left Ear: Tympanic membrane and ear canal normal.  Mouth/Throat: Oropharynx is clear and moist.  Eyes: Pupils are equal, round, and reactive to light. No scleral icterus.  Neck: Normal range of motion. No thyromegaly present.  Cardiovascular: Normal rate and regular rhythm.   No murmur heard. Pulmonary/Chest: Effort  normal and breath sounds normal. No respiratory distress. He has no wheezes. She has no rales. She exhibits no tenderness.  Abdominal: Soft. Bowel sounds are normal. He exhibits no distension and no mass. There is no tenderness. There is no rebound and no guarding.  Musculoskeletal: She exhibits no edema.  Lymphadenopathy:    She has no cervical adenopathy.  Neurological: She is alert and oriented to person, place, and time. She has normal patellar reflexes. She exhibits normal muscle tone. Coordination normal.  Skin: Skin is warm and dry.  Psychiatric: She has a normal mood and affect. Her behavior is normal. Judgment and thought content normal.  Breasts: Examined lying Right: Without masses, retractions, discharge or axillary adenopathy.  Left: Without masses, retractions, discharge or axillary adenopathy.  Pelvic: deferred          Assessment & Plan:           Assessment & Plan:  EKG is performed and personally reviewed by me- notes NSR without any acute changes.

## 2015-03-13 NOTE — Assessment & Plan Note (Addendum)
Continue iron, obtain cbc. Pt advised to complete IFOB.

## 2015-03-14 ENCOUNTER — Encounter: Payer: Self-pay | Admitting: Family

## 2015-03-14 LAB — HEPATIC FUNCTION PANEL
ALBUMIN: 4.1 g/dL (ref 3.5–5.2)
ALK PHOS: 61 U/L (ref 39–117)
ALT: 9 U/L (ref 0–35)
AST: 12 U/L (ref 0–37)
Bilirubin, Direct: 0 mg/dL (ref 0.0–0.3)
TOTAL PROTEIN: 7.4 g/dL (ref 6.0–8.3)
Total Bilirubin: 0.3 mg/dL (ref 0.2–1.2)

## 2015-03-14 LAB — CBC WITH DIFFERENTIAL/PLATELET
BASOS ABS: 0.2 10*3/uL — AB (ref 0.0–0.1)
BASOS PCT: 3 % (ref 0.0–3.0)
EOS ABS: 0.1 10*3/uL (ref 0.0–0.7)
Eosinophils Relative: 2 % (ref 0.0–5.0)
HCT: 32.5 % — ABNORMAL LOW (ref 36.0–46.0)
HEMOGLOBIN: 10.6 g/dL — AB (ref 12.0–15.0)
LYMPHS PCT: 29.8 % (ref 12.0–46.0)
Lymphs Abs: 1.6 10*3/uL (ref 0.7–4.0)
MCHC: 32.6 g/dL (ref 30.0–36.0)
MCV: 80.2 fl (ref 78.0–100.0)
MONOS PCT: 10.8 % (ref 3.0–12.0)
Monocytes Absolute: 0.6 10*3/uL (ref 0.1–1.0)
NEUTROS ABS: 2.9 10*3/uL (ref 1.4–7.7)
NEUTROS PCT: 54.4 % (ref 43.0–77.0)
Platelets: 367 10*3/uL (ref 150.0–400.0)
RBC: 4.05 Mil/uL (ref 3.87–5.11)
RDW: 19.1 % — AB (ref 11.5–15.5)
WBC: 5.3 10*3/uL (ref 4.0–10.5)

## 2015-03-14 LAB — LIPID PANEL
CHOLESTEROL: 207 mg/dL — AB (ref 0–200)
HDL: 74.3 mg/dL (ref >39.00)
LDL Cholesterol: 124 mg/dL — ABNORMAL HIGH (ref 0–99)
NONHDL: 132.7
Total CHOL/HDL Ratio: 3
Triglycerides: 46 mg/dL (ref 0.0–149.0)
VLDL: 9.2 mg/dL (ref 0.0–40.0)

## 2015-03-14 LAB — URINALYSIS, ROUTINE W REFLEX MICROSCOPIC
Bilirubin Urine: NEGATIVE
KETONES UR: NEGATIVE
Nitrite: POSITIVE — AB
PH: 6.5 (ref 5.0–8.0)
Specific Gravity, Urine: 1.01 (ref 1.000–1.030)
TOTAL PROTEIN, URINE-UPE24: NEGATIVE
Urine Glucose: NEGATIVE
Urobilinogen, UA: 0.2 (ref 0.0–1.0)

## 2015-03-14 LAB — TSH: TSH: 2 u[IU]/mL (ref 0.35–4.50)

## 2015-03-15 ENCOUNTER — Telehealth: Payer: Self-pay | Admitting: Family

## 2015-03-15 LAB — URINE CULTURE: Colony Count: >=100000

## 2015-03-15 MED ORDER — NITROFURANTOIN MACROCRYSTAL 100 MG PO CAPS: 100.0000 mg | ORAL_CAPSULE | Freq: Two times a day (BID) | ORAL | Status: DC

## 2015-03-15 NOTE — Telephone Encounter (Signed)
Please contact patient and let her know that her urine did grow some bacteria.  I would recommend short course of antibiotics.  I did send her a letter re: abnormal UA but did not see that a culture had been sent.

## 2015-03-15 NOTE — Telephone Encounter (Signed)
Notified pt and she voices understanding. 

## 2016-01-12 ENCOUNTER — Telehealth: Payer: Self-pay | Admitting: Behavioral Health

## 2016-01-12 ENCOUNTER — Encounter: Payer: Self-pay | Admitting: Behavioral Health

## 2016-01-12 NOTE — Telephone Encounter (Signed)
Pre-Visit Call completed with patient and chart updated.   Pre-Visit Info documented in Specialty Comments under [employer].    

## 2016-01-15 ENCOUNTER — Ambulatory Visit (INDEPENDENT_AMBULATORY_CARE_PROVIDER_SITE_OTHER): Payer: PRIVATE HEALTH INSURANCE | Admitting: Family

## 2016-01-15 ENCOUNTER — Encounter: Payer: Self-pay | Admitting: Family

## 2016-01-15 ENCOUNTER — Telehealth: Payer: Self-pay | Admitting: Family

## 2016-01-15 VITALS — BP 142/82 | HR 98 | Temp 99.3°F | Resp 16 | Ht 64.0 in | Wt 190.8 lb

## 2016-01-15 DIAGNOSIS — Z0001 Encounter for general adult medical examination with abnormal findings: Secondary | ICD-10-CM | POA: Diagnosis not present

## 2016-01-15 DIAGNOSIS — Z331 Pregnant state, incidental: Secondary | ICD-10-CM | POA: Diagnosis not present

## 2016-01-15 DIAGNOSIS — Z3490 Encounter for supervision of normal pregnancy, unspecified, unspecified trimester: Secondary | ICD-10-CM

## 2016-01-15 DIAGNOSIS — Z23 Encounter for immunization: Secondary | ICD-10-CM

## 2016-01-15 DIAGNOSIS — Z0000 Encounter for general adult medical examination without abnormal findings: Secondary | ICD-10-CM | POA: Diagnosis not present

## 2016-01-15 DIAGNOSIS — R2231 Localized swelling, mass and lump, right upper limb: Secondary | ICD-10-CM

## 2016-01-15 DIAGNOSIS — N912 Amenorrhea, unspecified: Secondary | ICD-10-CM | POA: Diagnosis not present

## 2016-01-15 LAB — CBC WITH DIFFERENTIAL/PLATELET
BASOS ABS: 0 10*3/uL (ref 0.0–0.1)
Basophils Relative: 0.4 % (ref 0.0–3.0)
Eosinophils Absolute: 0.1 10*3/uL (ref 0.0–0.7)
Eosinophils Relative: 1.9 % (ref 0.0–5.0)
HCT: 31.8 % — ABNORMAL LOW (ref 36.0–46.0)
Hemoglobin: 10.5 g/dL — ABNORMAL LOW (ref 12.0–15.0)
LYMPHS ABS: 1.1 10*3/uL (ref 0.7–4.0)
Lymphocytes Relative: 21.6 % (ref 12.0–46.0)
MCHC: 32.9 g/dL (ref 30.0–36.0)
MCV: 81.1 fl (ref 78.0–100.0)
MONOS PCT: 9.7 % (ref 3.0–12.0)
Monocytes Absolute: 0.5 10*3/uL (ref 0.1–1.0)
NEUTROS PCT: 66.4 % (ref 43.0–77.0)
Neutro Abs: 3.4 10*3/uL (ref 1.4–7.7)
Platelets: 370 10*3/uL (ref 150.0–400.0)
RBC: 3.92 Mil/uL (ref 3.87–5.11)
RDW: 16.7 % — ABNORMAL HIGH (ref 11.5–15.5)
WBC: 5.1 10*3/uL (ref 4.0–10.5)

## 2016-01-15 LAB — BASIC METABOLIC PANEL
BUN: 7 mg/dL (ref 6–23)
CALCIUM: 9.2 mg/dL (ref 8.4–10.5)
CO2: 20 mEq/L (ref 19–32)
Chloride: 103 mEq/L (ref 96–112)
Creatinine, Ser: 0.65 mg/dL (ref 0.40–1.20)
GFR: 126.88 mL/min (ref >60.00)
Glucose, Bld: 87 mg/dL (ref 70–99)
Potassium: 3.7 mEq/L (ref 3.5–5.1)
SODIUM: 135 meq/L (ref 135–145)

## 2016-01-15 LAB — LIPID PANEL
CHOLESTEROL: 191 mg/dL (ref 0–200)
HDL: 78.6 mg/dL (ref >39.00)
LDL Cholesterol: 99 mg/dL (ref 0–99)
NonHDL: 112.02
Total CHOL/HDL Ratio: 2
Triglycerides: 67 mg/dL (ref 0.0–149.0)
VLDL: 13.4 mg/dL (ref 0.0–40.0)

## 2016-01-15 LAB — URINALYSIS, ROUTINE W REFLEX MICROSCOPIC
BILIRUBIN URINE: NEGATIVE
Hgb urine dipstick: NEGATIVE
KETONES UR: NEGATIVE
LEUKOCYTES UA: NEGATIVE
NITRITE: POSITIVE — AB
PH: 7.5 (ref 5.0–8.0)
Specific Gravity, Urine: <=1.005 — AB (ref 1.000–1.030)
TOTAL PROTEIN, URINE-UPE24: NEGATIVE
UROBILINOGEN UA: 0.2 (ref 0.0–1.0)
Urine Glucose: NEGATIVE

## 2016-01-15 LAB — HEPATIC FUNCTION PANEL
ALBUMIN: 4.3 g/dL (ref 3.5–5.2)
ALK PHOS: 52 U/L (ref 39–117)
ALT: 12 U/L (ref 0–35)
AST: 19 U/L (ref 0–37)
Bilirubin, Direct: 0.1 mg/dL (ref 0.0–0.3)
TOTAL PROTEIN: 7.7 g/dL (ref 6.0–8.3)
Total Bilirubin: 0.4 mg/dL (ref 0.2–1.2)

## 2016-01-15 LAB — POCT URINE HCG BY VISUAL COLOR COMPARISON TESTS: Preg Test, Ur: POSITIVE — AB

## 2016-01-15 LAB — TSH: TSH: 2.28 u[IU]/mL (ref 0.35–4.50)

## 2016-01-15 MED ORDER — NYSTATIN 100000 UNIT/GM EX CREA: 1.0000 "application " | TOPICAL_CREAM | Freq: Two times a day (BID) | CUTANEOUS | Status: DC

## 2016-01-15 NOTE — Telephone Encounter (Signed)
Spoke with pt and notified her that we will not be able to proceed with diagnostic mammogram while pregnant but can proceed with u/s. Order changed  In [facility] and appt scheduled for 01/22/16 at 8:40am. Pt has been notified.

## 2016-01-15 NOTE — Patient Instructions (Addendum)
Please complete lab work prior to leaving. You will be contacted about your mammogram/ultrasound. Please let me know if you have not heard back in 1 week about this appointment. Apply nystatin cream twice daily to affected area.  Work on low sodium diet, exercise and weight loss.

## 2016-01-15 NOTE — Progress Notes (Signed)
Subjective:    Patient ID: Joanna Wright, female    DOB: 04-28-1971, 45 y.o.   MRN: [id number]  HPI   [name] is a 45 yr old female who presents today for cpx.    Immunizations: tetanus is due Diet: reports that she needs to eat more veggies Exercise: walking every day for 30 min a day Pap Smear: 6/15 neg, due next year Mammogram: due Period was due 3/17.  Wt Readings from Last 3 Encounters:  01/15/16 190 lb 12.8 oz (86.546 kg)  03/13/15 197 lb 3.2 oz (89.449 kg)  02/13/15 196 lb (88.905 kg)  dental: up to date Vision: last year  Notes + rash between breasts.  Also notes some swelling/tenderness right axilla  Review of Systems  Constitutional: Negative for unexpected weight change.  HENT: Negative for hearing loss and rhinorrhea.   Eyes: Negative for visual disturbance.  Respiratory:       + cough  Cardiovascular: Negative for leg swelling.  Gastrointestinal: Negative for diarrhea and constipation.  Genitourinary: Negative for dysuria, frequency and menstrual problem.  Musculoskeletal: Negative for myalgias and arthralgias.  Skin:       + rash between breasts  Neurological: Negative for headaches.  Hematological: Negative for adenopathy.  Psychiatric/Behavioral:       Denies depression/anxiety       Past Medical History  Diagnosis Date  . DUB (dysfunctional uterine bleeding)     Hx  . Night sweats     Hx  . Psoriasis of scalp   . MHA (microangiopathic hemolytic anemia) (HCC)     Hx     Social History   Social History  . Marital Status: Legally Separated    Spouse Name: N/A  . Number of Children: N/A  . Years of Education: N/A   Occupational History  . Not on file.   Social History Main Topics  . Smoking status: Never Smoker   . Smokeless tobacco: Never Used  . Alcohol Use: No  . Drug Use: No  . Sexual Activity: Yes    Birth Control/ Protection: Condom   Other Topics Concern  . Not on file   Social History Narrative   Recently  remarried   No children   Works in [employer]   Completed [employer] [place] in [place]   Enjoys sleeping and shopping   Has a pitbull       Past Surgical History  Procedure Laterality Date  . Achilles tendon repair Right 2010  . Wisdom tooth extraction    . Hysteroscopy N/A 05/19/2014    Procedure: HYSTEROSCOPY;  Surgeon: Sharene Butters, MD;  Location: [facility];  Service: Gynecology;  Laterality: N/A;  . Cervical polypectomy N/A 05/19/2014    Procedure: CERVICAL POLYPECTOMY;  Surgeon: Sharene Butters, MD;  Location: [facility];  Service: Gynecology;  Laterality: N/A;    Family History  Problem Relation Age of Onset  . Hypertension Other   . Hypertension Mother   . Glaucoma Father   . Cancer Neg Hx   . Kidney disease Neg Hx   . Diabetes Neg Hx     No Known Allergies  Current Outpatient Prescriptions on File Prior to Visit  Medication Sig Dispense Refill  . Ferrous Sulfate (IRON) 325 (65 FE) MG TABS Take 1 tablet by mouth 2 (two) times daily. (Patient not taking: Reported on 01/12/2016) 30 each 0   No current facility-administered medications on file prior to visit.    BP 142/82 mmHg  Pulse 98  Temp(Src) 99.3 F (37.4 C) (Oral)  Resp 16  Ht 5\' 4"  (1.626 m)  Wt 190 lb 12.8 oz (86.546 kg)  BMI 32.73 kg/m2  SpO2 100%  LMP 12/08/2015    Objective:   Physical Exam  Physical Exam  Constitutional: She is oriented to person, place, and time. She appears well-developed and well-nourished. No distress.  HENT:  Head: Normocephalic and atraumatic.  Right Ear: Tympanic membrane and ear canal normal.  Left Ear: Tympanic membrane and ear canal normal.  Mouth/Throat: Oropharynx is clear and moist.  Eyes: Pupils are equal, round, and reactive to light. No scleral icterus.  Neck: Normal range of motion. No thyromegaly present.  Cardiovascular: Normal rate and regular rhythm.   No murmur heard. Pulmonary/Chest: Effort normal and breath sounds normal. No respiratory distress. He has  no wheezes. She has no rales. She exhibits no tenderness.  Abdominal: Soft. Bowel sounds are normal. He exhibits no distension and no mass. There is no tenderness. There is no rebound and no guarding.  Musculoskeletal: She exhibits no edema.  Lymphadenopathy:    She has no cervical adenopathy.  Neurological: She is alert and oriented to person, place, and time. She has normal patellar reflexes. She exhibits normal muscle tone. Coordination normal.  Skin: Skin is warm and dry. hyperpigmented rash between breasts and beneath breasts.  Psychiatric: She has a normal mood and affect. Her behavior is normal. Judgment and thought content normal.  Breasts: Examined lying Right: Without masses, retractions, discharge or right axilla- notes some excess fatty tissue as well as as several small pea sized tender masses (?adenopathy) Left: Without masses, retractions, discharge or axillary adenopathy.          Assessment & Plan:         Assessment & Plan:  Preventative care- discussed healthy diet, exercise. Tdap today.   Pregnancy- see phone note, urine HCG +.  Discussed adding prenatal vitamin, refer to OB/GYN.   R axillary mass- order was changed from diagnostic mammo to [place] right axilla due to pregnancy.

## 2016-01-15 NOTE — Telephone Encounter (Signed)
Reviewed + pregnancy test with patient. Advised pt to begin otc prenatal vitamin. Will arrange apt with ob/gyn for prenatal care. Advised pt to avoid alcohol, undercooked foods (such as raw fish),  Limit caffeine intake.

## 2016-01-15 NOTE — Progress Notes (Signed)
Pre visit review using our clinic review tool, if applicable. No additional management support is needed unless otherwise documented below in the visit note. 

## 2016-01-16 ENCOUNTER — Other Ambulatory Visit (INDEPENDENT_AMBULATORY_CARE_PROVIDER_SITE_OTHER): Payer: PRIVATE HEALTH INSURANCE

## 2016-01-16 DIAGNOSIS — D649 Anemia, unspecified: Secondary | ICD-10-CM

## 2016-01-16 LAB — IRON: IRON: 29 ug/dL — AB (ref 42–145)

## 2016-01-17 ENCOUNTER — Telehealth: Payer: Self-pay | Admitting: Family

## 2016-01-17 DIAGNOSIS — D509 Iron deficiency anemia, unspecified: Secondary | ICD-10-CM

## 2016-01-17 MED ORDER — FERROUS SULFATE 325 (65 FE) MG PO TABS: 325.0000 mg | ORAL_TABLET | Freq: Two times a day (BID) | ORAL | Status: DC

## 2016-01-17 NOTE — Telephone Encounter (Signed)
Lab work shows iron def anemia. Start iron 325mg  bid. Repeat cbc in 6 weeks.

## 2016-01-17 NOTE — Telephone Encounter (Signed)
Notified pt and scheduled lab appt for 02/21/16 at 7am; future order entered.

## 2016-01-22 ENCOUNTER — Other Ambulatory Visit: Payer: PRIVATE HEALTH INSURANCE

## 2016-01-31 ENCOUNTER — Other Ambulatory Visit: Payer: Self-pay | Admitting: Obstetrics and Gynecology

## 2016-01-31 DIAGNOSIS — D259 Leiomyoma of uterus, unspecified: Secondary | ICD-10-CM | POA: Insufficient documentation

## 2016-01-31 DIAGNOSIS — Z3490 Encounter for supervision of normal pregnancy, unspecified, unspecified trimester: Secondary | ICD-10-CM | POA: Insufficient documentation

## 2016-02-01 ENCOUNTER — Ambulatory Visit
Admission: RE | Admit: 2016-02-01 | Discharge: 2016-02-01 | Disposition: A | Discharge status: Home / Self Care | Payer: PRIVATE HEALTH INSURANCE | Source: Ambulatory Visit | Attending: Family | Admitting: Family

## 2016-02-01 DIAGNOSIS — R2231 Localized swelling, mass and lump, right upper limb: Secondary | ICD-10-CM

## 2016-02-01 LAB — CYTOLOGY - PAP

## 2016-02-21 ENCOUNTER — Other Ambulatory Visit (INDEPENDENT_AMBULATORY_CARE_PROVIDER_SITE_OTHER): Payer: PRIVATE HEALTH INSURANCE

## 2016-02-21 DIAGNOSIS — D509 Iron deficiency anemia, unspecified: Secondary | ICD-10-CM | POA: Diagnosis not present

## 2016-02-21 LAB — CBC WITH DIFFERENTIAL/PLATELET
BASOS PCT: 0.6 % (ref 0.0–3.0)
Basophils Absolute: 0 10*3/uL (ref 0.0–0.1)
EOS PCT: 1.3 % (ref 0.0–5.0)
Eosinophils Absolute: 0.1 10*3/uL (ref 0.0–0.7)
HEMATOCRIT: 31.2 % — AB (ref 36.0–46.0)
HEMOGLOBIN: 10.2 g/dL — AB (ref 12.0–15.0)
LYMPHS PCT: 22.7 % (ref 12.0–46.0)
Lymphs Abs: 1.3 10*3/uL (ref 0.7–4.0)
MCHC: 32.8 g/dL (ref 30.0–36.0)
MCV: 82 fl (ref 78.0–100.0)
MONO ABS: 0.5 10*3/uL (ref 0.1–1.0)
MONOS PCT: 8.1 % (ref 3.0–12.0)
Neutro Abs: 3.8 10*3/uL (ref 1.4–7.7)
Neutrophils Relative %: 67.3 % (ref 43.0–77.0)
Platelets: 388 10*3/uL (ref 150.0–400.0)
RBC: 3.81 Mil/uL — AB (ref 3.87–5.11)
RDW: 17.5 % — ABNORMAL HIGH (ref 11.5–15.5)
WBC: 5.7 10*3/uL (ref 4.0–10.5)

## 2016-02-22 DIAGNOSIS — O039 Complete or unspecified spontaneous abortion without complication: Secondary | ICD-10-CM

## 2016-02-22 HISTORY — DX: Complete or unspecified spontaneous abortion without complication: O03.9

## 2016-02-23 ENCOUNTER — Telehealth: Payer: Self-pay | Admitting: *Deleted

## 2016-02-23 ENCOUNTER — Other Ambulatory Visit: Payer: Self-pay | Admitting: Family

## 2016-02-23 DIAGNOSIS — D649 Anemia, unspecified: Secondary | ICD-10-CM

## 2016-02-23 NOTE — Telephone Encounter (Signed)
Notified pt. States she has only been taking a prenatal vitamin. Advised pt to start iron 321m twice a day. Scheduled lab appt for 04/24/16 at 7:30am. Future orders entered. IFOB kit mailed to pt.

## 2016-02-23 NOTE — Telephone Encounter (Signed)
-----   Message from [name], NP sent at 02/22/2016 10:39 AM EDT ----- Pt is still anemic.  Is she taking iron supplement. I would like her to complete IFOB please.  Make sure taking iron 325mg  bid and repeat serum iron and cbc in 2 months.

## 2016-03-08 ENCOUNTER — Other Ambulatory Visit (HOSPITAL_COMMUNITY): Payer: Self-pay | Admitting: Obstetrics and Gynecology

## 2016-03-08 DIAGNOSIS — Z3689 Encounter for other specified antenatal screening: Secondary | ICD-10-CM

## 2016-03-08 DIAGNOSIS — Z3A18 18 weeks gestation of pregnancy: Secondary | ICD-10-CM

## 2016-03-12 ENCOUNTER — Encounter (HOSPITAL_COMMUNITY): Payer: Self-pay

## 2016-03-13 ENCOUNTER — Ambulatory Visit (HOSPITAL_COMMUNITY)
Admission: RE | Admit: 2016-03-13 | Discharge: 2016-03-13 | Disposition: A | Discharge status: Home / Self Care | Payer: PRIVATE HEALTH INSURANCE | Source: Ambulatory Visit | Attending: Obstetrics and Gynecology | Admitting: Obstetrics and Gynecology

## 2016-03-13 DIAGNOSIS — O289 Unspecified abnormal findings on antenatal screening of mother: Secondary | ICD-10-CM

## 2016-03-13 DIAGNOSIS — O09511 Supervision of elderly primigravida, first trimester: Secondary | ICD-10-CM | POA: Insufficient documentation

## 2016-03-13 DIAGNOSIS — Z3A13 13 weeks gestation of pregnancy: Secondary | ICD-10-CM | POA: Insufficient documentation

## 2016-03-13 NOTE — Progress Notes (Signed)
Appointment Date: 03/13/2016 DOB: Apr 04, 1971 Referring Provider: Allyn Kenner, DO Attending: Dr. Benjaman Lobe  [name] was seen for genetic counseling because of a maternal age of 45 y.o. and an abnormal Panorama result.  In summary:  Discussed AMA and associated risk for fetal aneuploidy  Reviewed results of screening  NIPS - high risk for Trisomy 18  Ultrasound - pending  Discussed diagnostic testing options  Amniocentesis - will consider at time of ultrasound on 7/19  Reviewed family history concerns - none reported  Discussed carrier screening options  CF - declined  SMA - declined  Hemoglobinopathies - normal per medical record  She was counseled regarding maternal age and the association with risk for chromosome conditions due to nondisjunction with aging of the ova.   We reviewed chromosomes, nondisjunction, and the associated 1 in 8 risk for fetal aneuploidy related to a maternal age of 45 y.o. at [redacted]w[redacted]d gestation.  She was counseled that the risk for aneuploidy decreases as gestational age increases, accounting for those pregnancies which spontaneously abort.  We specifically discussed Down syndrome (trisomy 82), trisomies 49 and 16, and sex chromosome aneuploidies (47,XXX and 47,XXY) including the common features and prognoses of each.   We reviewed the results of her noninvasive prenatal screening (NIPS)/cell free DNA (cfDNA) screening (Panorama).  We discussed this screen in detail, specifically that it is not diagnostic for fetal aneuploidy, but that the positive predictive value is approximately 94%. We reviewed that the cell free DNA screen can not distinguish between aneuploidy confined to the placenta, trisomy 55, partial trisomy 82, or mosaic trisomy 93.  Considering the suspicion for trisomy 71, she was counseled in detail regarding this diagnosis. We discussed that trisomy 42 represents the second most common autosomal trisomy after Down syndrome  and occurs in 1 in 3600-8500 livebirths. The prevalence is estimated to be much higher when pregnancy terminations, stillbirths, and miscarriages are included.    She was then counseled that trisomy 12 is characterized by prenatal onset of growth deficiency, neurodevelopmental delay that typically results in profound mental retardation in survivors, and significant organ system anomalies. Although any organ system can be involved, we specifically discussed that cardiac defects occur in 90% of children with trisomy 18; anomalies of the brain are present in nearly all (agenesis of the corpus callosum, small cerebellum, microgyria, and ventriculomegaly) individuals with trisomy 18; myelomeningocele occurs in 5% and approximately 2/3 of children with trisomy 18 have genitourinary anomalies, most commonly a horseshoe kidney. Additionally, omphaloceles are seen in ~30% of fetuses with trisomy 18 and limb deficiency defects (short or absent radii) occur in 5-10%. We also discussed that detailed ultrasound at 18+ weeks may identify other more subtle differences (markers) including choroid plexus cysts, rocker bottom feet, clenched hands, and strawberry shaped cranium. Although these later findings do not cause major medical problems, they are well described findings in fetuses with trisomy 70. [name] was counseled that the above discussed findings provide a generalized overview; however, each child with trisomy 33 is unique and an individualized healthcare plan must be established based on the needs of that child.   She was counseled that the prognosis for trisomy 82 is very guarded. She understands that pregnancies with trisomy 5 have a significantly increased risk for miscarriage and stillbirth. Of those that survive to term, ~90% of infants die during the first year of life. Although this condition is often labeled as lethal, she was counseled that ~5-8% of children do indeed  survive, some well beyond  a year of life. We discussed that neonates with trisomy 22 most often pass away from respiratory concerns secondary to congenital heart disease.   Given that the NIPS is not diagnostic, we discussed the information which could be gained through ultrasound and diagnostic testing.  She was counseled regarding diagnostic testing via [facility] and amniocentesis. We reviewed the approximate 1 in [id number] risk for complications from amniocentesis, including spontaneous pregnancy loss. We discussed the possible results that the tests might provide including: positive, negative, unanticipated, and no result. She was offered the option of scheduling amniocentesis for 15 weeks, or for later in the pregnancy.  She is currently scheduled for ultrasound in our facility on July 19th.  She would like to keep her ultrasound at her doctor's office on the 5th and this one on the 19th.  She may consider amniocentesis on the 19th, pending the outcome of that ultrasound.    We discussed the option of continuing the pregnancy versus termination of pregnancy. She was counseled regarding the 20 week limit for this procedure in the state of [place], as well as the 72 hour waiting period after consents are signed. We also discussed the pregnancy management options when a diagnosis of Trisomy 50 is made and the family wishes to continue the pregnancy. We discussed the option of fetal echocardiogram and consultation with a neonatologist.  She has not discussed this results with her husband, but will discuss it with him today.  She asked many appropriate and thoughtful questions.  She was encouraged to contact me directly with any further questions or concerns.   [name] was provided with written information regarding cystic fibrosis (CF), spinal muscular atrophy (SMA) and hemoglobinopathies including the carrier frequency, availability of carrier screening and prenatal diagnosis if indicated.  In addition, we discussed that CF and  hemoglobinopathies are routinely screened for as part of the [place] newborn screening panel.  After further discussion, she declined screening for CF, SMA and hemoglobinopathies.  Both family histories were reviewed and found to be noncontributory for birth defects, intellectual disability, and known genetic conditions.  However, Mr. Joanna Wright is 45  years of age.  We discussed that advanced paternal age is defined as paternal age greater than or equal to age 15.  Recent large-scale sequencing studies have shown that approximately 80% of de novo point mutations are of paternal origin.  Many studies have demonstrated a strong correlation between increased paternal age and de novo point mutations.  It is estimated that the overall chance for a de novo mutation is ~0.5%.  We also discussed the wide range of conditions which can be caused by new dominant gene mutations (achondroplasia, neurofibromatosis, Marfan syndrome etc.).  She was counseled that genetic testing for each individual single gene condition is not warranted or available unless ultrasound or concerns lend suspicion to a specific condition.    She was also counseled that some literature suggests that APA is also associated with an increase in risk for fetal aneuploidy.  While other literature does not support this, we discussed that a specific risk for aneuploidy other than that based on maternal age cannot be quantified. Lastly, we discussed that newer literature suggests that the risk for autism spectrum disorders (ASD) may be increased in children born to fathers of APA.  We discussed that ASDs are among the most common neurodevelopmental disorders, with approximately 1 in 85 children meeting criteria for ASD.  Approximately 80% of individuals diagnosed are female.  There is strong evidence that genetic factors play a critical role in development of ASD.  While there have been recent advances in identifying specific genetic causes of ASD, there are still  many individuals for whom the etiology of the ASD is not known.  She understands that at this time there is no reliable, comprehensive genetic testing available for ASD and a specific paternal age related risk can not be quantified.  Without further information regarding the provided family history, an accurate genetic risk cannot be calculated. Further genetic counseling is warranted if more information is obtained.   [name] denied exposure to environmental toxins or chemical agents. She denied the use of alcohol, tobacco or street drugs. She denied significant viral illnesses during the course of her pregnancy. Her medical and surgical histories were noncontributory.   I counseled [name] regarding the above risks and available options.  The approximate face-to-face time with the genetic counselor was 50 minutes.  Cam Hai, MS,  Certified Genetic Counselor

## 2016-03-22 ENCOUNTER — Inpatient Hospital Stay (HOSPITAL_COMMUNITY)
Admission: AD | Admit: 2016-03-22 | Discharge: 2016-03-22 | Disposition: A | Discharge status: Home / Self Care | Payer: PRIVATE HEALTH INSURANCE | Source: Ambulatory Visit | Attending: Obstetrics | Admitting: Obstetrics

## 2016-03-22 ENCOUNTER — Encounter (HOSPITAL_COMMUNITY): Payer: Self-pay | Admitting: *Deleted

## 2016-03-22 ENCOUNTER — Inpatient Hospital Stay (HOSPITAL_COMMUNITY): Payer: PRIVATE HEALTH INSURANCE

## 2016-03-22 DIAGNOSIS — D250 Submucous leiomyoma of uterus: Secondary | ICD-10-CM | POA: Diagnosis not present

## 2016-03-22 DIAGNOSIS — N939 Abnormal uterine and vaginal bleeding, unspecified: Secondary | ICD-10-CM | POA: Diagnosis present

## 2016-03-22 DIAGNOSIS — O039 Complete or unspecified spontaneous abortion without complication: Secondary | ICD-10-CM | POA: Diagnosis not present

## 2016-03-22 DIAGNOSIS — IMO0002 Reserved for concepts with insufficient information to code with codable children: Secondary | ICD-10-CM

## 2016-03-22 LAB — CBC
HCT: 31.8 % — ABNORMAL LOW (ref 36.0–46.0)
HEMOGLOBIN: 10.6 g/dL — AB (ref 12.0–15.0)
MCH: 27 pg (ref 26.0–34.0)
MCHC: 33.3 g/dL (ref 30.0–36.0)
MCV: 80.9 fL (ref 78.0–100.0)
Platelets: 348 10*3/uL (ref 150–400)
RBC: 3.93 MIL/uL (ref 3.87–5.11)
RDW: 16.5 % — ABNORMAL HIGH (ref 11.5–15.5)
WBC: 5.8 10*3/uL (ref 4.0–10.5)

## 2016-03-22 NOTE — MAU Note (Signed)
Pt started having some lower abdominal cramping and vaginal bleeding that started early this morning but got worse tonight. Rates cramping 7/10. Did not take anything for pain. Had a large gush of blood around midnight. States she passed some small clots.

## 2016-03-22 NOTE — MAU Provider Note (Signed)
History     CSN: [id number]  Arrival date and time: 03/22/16 [id number]   First Provider Initiated Contact with Patient 03/22/16 0112      Chief Complaint  Patient presents with  . Vaginal Bleeding   Vaginal Bleeding The patient's primary symptoms include pelvic pain and vaginal bleeding. This is a new problem. The current episode started today. The problem occurs constantly. The problem has been gradually worsening. Pain severity now: 3/10  The problem affects both sides. She is pregnant. Associated symptoms include abdominal pain. Pertinent negatives include no chills, constipation, diarrhea, dysuria, fever, frequency, nausea, urgency or vomiting. The vaginal discharge was bloody. The vaginal bleeding is heavier than menses. She has been passing clots (about the size of a grape.). Nothing aggravates the symptoms. She has tried nothing for the symptoms. Sexual activity: No intercourse in the last 24 hours.    Past Medical History  Diagnosis Date  . DUB (dysfunctional uterine bleeding)     Hx  . Night sweats     Hx  . Psoriasis of scalp   . MHA (microangiopathic hemolytic anemia) (HCC)     Hx    Past Surgical History  Procedure Laterality Date  . Achilles tendon repair Right 2010  . Wisdom tooth extraction    . Hysteroscopy N/A 05/19/2014    Procedure: HYSTEROSCOPY;  Surgeon: Sharene Butters, MD;  Location: [facility];  Service: Gynecology;  Laterality: N/A;  . Cervical polypectomy N/A 05/19/2014    Procedure: CERVICAL POLYPECTOMY;  Surgeon: Sharene Butters, MD;  Location: [facility];  Service: Gynecology;  Laterality: N/A;    Family History  Problem Relation Age of Onset  . Hypertension Other   . Hypertension Mother   . Glaucoma Father   . Cancer Neg Hx   . Kidney disease Neg Hx   . Diabetes Neg Hx     Social History  Substance Use Topics  . Smoking status: Never Smoker   . Smokeless tobacco: Never Used  . Alcohol Use: No    Allergies: No Known Allergies  Prescriptions  prior to admission  Medication Sig Dispense Refill Last Dose  . ferrous sulfate 325 (65 FE) MG tablet Take 1 tablet (325 mg total) by mouth 2 (two) times daily with a meal.  3 03/21/2016 at Unknown time  . Prenatal Vit-Fe Fumarate-FA (PRENATAL MULTIVITAMIN) TABS tablet Take 1 tablet by mouth daily at 12 noon.   03/21/2016 at Unknown time  . nystatin cream (MYCOSTATIN) Apply 1 application topically 2 (two) times daily. 30 g 0     Review of Systems  Constitutional: Negative for fever and chills.  Gastrointestinal: Positive for abdominal pain. Negative for nausea, vomiting, diarrhea and constipation.  Genitourinary: Positive for vaginal bleeding and pelvic pain. Negative for dysuria, urgency and frequency.   Physical Exam   Blood pressure 176/101, pulse 93, resp. rate 18, height 5' 3.5" (1.613 m), weight 85.73 kg (189 lb), last menstrual period 12/08/2015, SpO2 96 %.  Physical Exam  Nursing note and vitals reviewed. Constitutional: She is oriented to person, place, and time. She appears well-developed and well-nourished. No distress.  HENT:  Head: Normocephalic.  Cardiovascular: Normal rate.   Respiratory: Effort normal.  GI: Soft. There is no tenderness. There is no rebound.  Genitourinary:   External: no lesion Vagina: large amount of blood and clots in the vagina.  Cervix: pink, smooth, clot/tissue at the cervical os    Neurological: She is alert and oriented to person, place, and time.  Skin:  Skin is warm and dry.  Psychiatric: She has a normal mood and affect.   Results for orders placed or performed during the hospital encounter of 03/22/16 (from the past 24 hour(s))  CBC     Status: Abnormal   Collection Time: 03/22/16  1:09 AM  Result Value Ref Range   WBC 5.8 4.0 - 10.5 K/uL   RBC 3.93 3.87 - 5.11 MIL/uL   Hemoglobin 10.6 (L) 12.0 - 15.0 g/dL   HCT 31.8 (L) 36.0 - 46.0 %   MCV 80.9 78.0 - 100.0 fL   MCH 27.0 26.0 - 34.0 pg   MCHC 33.3 30.0 - 36.0 g/dL   RDW 16.5 (H)  11.5 - 15.5 %   Platelets 348 150 - 400 K/uL   US Ob Transvaginal  03/22/2016  CLINICAL DATA:  Heavy vaginal bleeding EXAM: TRANSVAGINAL OB ULTRASOUND TECHNIQUE: Transvaginal ultrasound was performed for complete evaluation of the gestation as well as the maternal uterus, adnexal regions, and pelvic cul-de-sac. COMPARISON:  None. FINDINGS: Intrauterine gestational sac: None Yolk sac:  No Embryo:  No Cardiac Activity: No Heart Rate:  bpm MSD:   mm    w     d CRL:     mm    w  d                  [place] EDC: Subchorionic hemorrhage:  None visualized. Maternal uterus/adnexae: 2 fibroids are visible. One is in the subserosal posterior fundus to the left of midline, measuring 2.6 cm. The other is in the submucosal midbody, measuring 1.7 cm. Both ovaries appear normal. Endometrium is homogeneous, 1.5 cm. IMPRESSION: 1. No visible gestational sac.  Careful follow-up recommended. 2. 2 uterine fibroids, 1 sub serosal in the posterior fundus and the other submucosal in the midbody. Electronically Signed   By: Andreas Newport M.D.   On: 03/22/2016 02:07    MAU Course  Procedures  MDM A+ blood type per Dr.Clark.  2353: D/W Dr. Carlis Abbott, reviewed [place] and physical exam/history. She will have the office call the patient to be seen in 1-2 weeks to check blood pressure and for FU.   Assessment and Plan   1. SAB (spontaneous abortion)   2. Vaginal bleeding   3. Fetal heart tones not heard    DC home Comfort measures reviewed  Bleeding precautions RX: none  Return to MAU as needed   Follow-up Information    Follow up with CALLAHAN, SIDNEY, DO.   Specialty:  Obstetrics and Gynecology   Why:  They will call you with an appointment   Contact information:   [place] [address] [phone]        Mathis Bud 03/22/2016, 1:16 AM

## 2016-03-22 NOTE — Discharge Instructions (Signed)
Miscarriage  A miscarriage is the sudden loss of an unborn baby (fetus) before the 20th week of pregnancy. Most miscarriages happen in the first 3 months of pregnancy. Sometimes, it happens before a woman even knows she is pregnant. A miscarriage is also called a "spontaneous miscarriage" or "early pregnancy loss." Having a miscarriage can be an emotional experience. Talk with your caregiver about any questions you may have about miscarrying, the grieving process, and your future pregnancy plans.  CAUSES    Problems with the fetal chromosomes that make it impossible for the baby to develop normally. Problems with the baby's genes or chromosomes are most often the result of errors that occur, by chance, as the embryo divides and grows. The problems are not inherited from the parents.   Infection of the cervix or uterus.    Hormone problems.    Problems with the cervix, such as having an incompetent cervix. This is when the tissue in the cervix is not strong enough to hold the pregnancy.    Problems with the uterus, such as an abnormally shaped uterus, uterine fibroids, or congenital abnormalities.    Certain medical conditions.    Smoking, drinking alcohol, or taking illegal drugs.    Trauma.   Often, the cause of a miscarriage is unknown.   SYMPTOMS    Vaginal bleeding or spotting, with or without cramps or pain.   Pain or cramping in the abdomen or lower back.   Passing fluid, tissue, or blood clots from the vagina.  DIAGNOSIS   Your caregiver will perform a physical exam. You may also have an ultrasound to confirm the miscarriage. Blood or urine tests may also be ordered.  TREATMENT    Sometimes, treatment is not necessary if you naturally pass all the fetal tissue that was in the uterus. If some of the fetus or placenta remains in the body (incomplete miscarriage), tissue left behind may become infected and must be removed. Usually, a dilation and curettage (D and C) procedure is performed.  During a D and C procedure, the cervix is widened (dilated) and any remaining fetal or placental tissue is gently removed from the uterus.   Antibiotic medicines are prescribed if there is an infection. Other medicines may be given to reduce the size of the uterus (contract) if there is a lot of bleeding.   If you have Rh negative blood and your baby was Rh positive, you will need a Rh immunoglobulin shot. This shot will protect any future baby from having Rh blood problems in future pregnancies.  HOME CARE INSTRUCTIONS    Your caregiver may order bed rest or may allow you to continue light activity. Resume activity as directed by your caregiver.   Have someone help with home and family responsibilities during this time.    Keep track of the number of sanitary pads you use each day and how soaked (saturated) they are. Write down this information.    Do not use tampons. Do not douche or have sexual intercourse until approved by your caregiver.    Only take over-the-counter or prescription medicines for pain or discomfort as directed by your caregiver.    Do not take aspirin. Aspirin can cause bleeding.    Keep all follow-up appointments with your caregiver.    If you or your partner have problems with grieving, talk to your caregiver or seek counseling to help cope with the pregnancy loss. Allow enough time to grieve before trying to get pregnant again.     SEEK IMMEDIATE MEDICAL CARE IF:    You have severe cramps or pain in your back or abdomen.   You have a fever.   You pass large blood clots (walnut-sized or larger) ortissue from your vagina. Save any tissue for your caregiver to inspect.    Your bleeding increases.    You have a thick, bad-smelling vaginal discharge.   You become lightheaded, weak, or you faint.    You have chills.   MAKE SURE YOU:   Understand these instructions.   Will watch your condition.   Will get help right away if you are not doing well or get worse.     This  information is not intended to replace advice given to you by your health care provider. Make sure you discuss any questions you have with your health care provider.     Document Released: 03/05/2001 Document Revised: 01/04/2013 Document Reviewed: 10/29/2011  Elsevier Interactive Patient Education 2016 [employer]vier Inc[employer]

## 2016-04-10 ENCOUNTER — Ambulatory Visit (HOSPITAL_COMMUNITY): Admission: RE | Admit: 2016-04-10 | Payer: PRIVATE HEALTH INSURANCE | Source: Ambulatory Visit

## 2016-04-11 ENCOUNTER — Ambulatory Visit (HOSPITAL_COMMUNITY): Payer: PRIVATE HEALTH INSURANCE

## 2016-04-19 ENCOUNTER — Other Ambulatory Visit (HOSPITAL_COMMUNITY): Payer: Self-pay

## 2016-04-19 ENCOUNTER — Encounter (HOSPITAL_COMMUNITY): Payer: Self-pay

## 2016-04-24 ENCOUNTER — Other Ambulatory Visit: Payer: PRIVATE HEALTH INSURANCE

## 2017-01-15 ENCOUNTER — Encounter (HOSPITAL_COMMUNITY): Payer: Self-pay

## 2017-02-12 ENCOUNTER — Ambulatory Visit (INDEPENDENT_AMBULATORY_CARE_PROVIDER_SITE_OTHER): Payer: PRIVATE HEALTH INSURANCE | Admitting: Family

## 2017-02-12 ENCOUNTER — Encounter: Payer: Self-pay | Admitting: Family

## 2017-02-12 VITALS — BP 150/100 | HR 90 | Temp 98.1°F | Resp 18 | Ht 64.0 in | Wt 199.4 lb

## 2017-02-12 DIAGNOSIS — R21 Rash and other nonspecific skin eruption: Secondary | ICD-10-CM | POA: Diagnosis not present

## 2017-02-12 DIAGNOSIS — Z0000 Encounter for general adult medical examination without abnormal findings: Secondary | ICD-10-CM | POA: Diagnosis not present

## 2017-02-12 DIAGNOSIS — F329 Major depressive disorder, single episode, unspecified: Secondary | ICD-10-CM

## 2017-02-12 DIAGNOSIS — F32A Depression, unspecified: Secondary | ICD-10-CM

## 2017-02-12 DIAGNOSIS — I10 Essential (primary) hypertension: Secondary | ICD-10-CM

## 2017-02-12 LAB — CBC WITH DIFFERENTIAL/PLATELET
BASOS ABS: 0.1 10*3/uL (ref 0.0–0.1)
Basophils Relative: 3.1 % — ABNORMAL HIGH (ref 0.0–3.0)
EOS PCT: 1.1 % (ref 0.0–5.0)
Eosinophils Absolute: 0 10*3/uL (ref 0.0–0.7)
HEMATOCRIT: 32.7 % — AB (ref 36.0–46.0)
HEMOGLOBIN: 10.7 g/dL — AB (ref 12.0–15.0)
LYMPHS PCT: 24.2 % (ref 12.0–46.0)
Lymphs Abs: 1 10*3/uL (ref 0.7–4.0)
MCHC: 32.7 g/dL (ref 30.0–36.0)
MCV: 81.7 fl (ref 78.0–100.0)
MONOS PCT: 9.1 % (ref 3.0–12.0)
Monocytes Absolute: 0.4 10*3/uL (ref 0.1–1.0)
Neutro Abs: 2.7 10*3/uL (ref 1.4–7.7)
Neutrophils Relative %: 62.5 % (ref 43.0–77.0)
Platelets: 363 10*3/uL (ref 150.0–400.0)
RBC: 4 Mil/uL (ref 3.87–5.11)
RDW: 16.5 % — ABNORMAL HIGH (ref 11.5–15.5)
WBC: 4.3 10*3/uL (ref 4.0–10.5)

## 2017-02-12 LAB — URINALYSIS, ROUTINE W REFLEX MICROSCOPIC
BILIRUBIN URINE: NEGATIVE
NITRITE: NEGATIVE
PH: 7 (ref 5.0–8.0)
RBC / HPF: NONE SEEN (ref 0–?)
SPECIFIC GRAVITY, URINE: 1.01 (ref 1.000–1.030)
TOTAL PROTEIN, URINE-UPE24: NEGATIVE
Urine Glucose: NEGATIVE
Urobilinogen, UA: 1 (ref 0.0–1.0)

## 2017-02-12 LAB — BASIC METABOLIC PANEL
BUN: 8 mg/dL (ref 6–23)
CO2: 26 mEq/L (ref 19–32)
Calcium: 9 mg/dL (ref 8.4–10.5)
Chloride: 103 mEq/L (ref 96–112)
Creatinine, Ser: 0.72 mg/dL (ref 0.40–1.20)
GFR: 112.21 mL/min (ref >60.00)
GLUCOSE: 90 mg/dL (ref 70–99)
Potassium: 3.8 mEq/L (ref 3.5–5.1)
Sodium: 137 mEq/L (ref 135–145)

## 2017-02-12 LAB — HEPATIC FUNCTION PANEL
ALBUMIN: 4.3 g/dL (ref 3.5–5.2)
ALK PHOS: 64 U/L (ref 39–117)
ALT: 9 U/L (ref 0–35)
AST: 15 U/L (ref 0–37)
Bilirubin, Direct: 0.2 mg/dL (ref 0.0–0.3)
Total Bilirubin: 0.4 mg/dL (ref 0.2–1.2)
Total Protein: 7.4 g/dL (ref 6.0–8.3)

## 2017-02-12 LAB — TSH: TSH: 1.22 u[IU]/mL (ref 0.35–4.50)

## 2017-02-12 LAB — LIPID PANEL
CHOLESTEROL: 206 mg/dL — AB (ref 0–200)
HDL: 69.7 mg/dL (ref >39.00)
LDL CALC: 123 mg/dL — AB (ref 0–99)
NONHDL: 136.47
Total CHOL/HDL Ratio: 3
Triglycerides: 65 mg/dL (ref 0.0–149.0)
VLDL: 13 mg/dL (ref 0.0–40.0)

## 2017-02-12 MED ORDER — FLUCONAZOLE 150 MG PO TABS: ORAL_TABLET | ORAL | 0 refills | Status: DC

## 2017-02-12 MED ORDER — AMLODIPINE BESYLATE 5 MG PO TABS: 5.0000 mg | ORAL_TABLET | Freq: Every day | ORAL | 3 refills | Status: DC

## 2017-02-12 MED FILL — AMLODIPINE BESYLATE 5 MG TA: 5 | 30 days supply | Qty: 30 | Fill #0

## 2017-02-12 MED FILL — FLUCONAZOLE 150 MG TABLET: 150 | 7 days supply | Qty: 2 | Fill #0

## 2017-02-12 NOTE — Progress Notes (Signed)
Subjective:    Patient ID: Joanna Wright, female    DOB: 09/01/1971, 46 y.o.   MRN: [id number]  HPI  Patient presents today for complete physical.  Immunizations: tetanus up to date Diet: reports diet is not healthy Exercise: no Pap Smear: 5/17 Mammogram: 2016, due Dental: due Vision: last year Wt Readings from Last 3 Encounters:  02/12/17 199 lb 6.4 oz (90.4 kg)  03/22/16 189 lb (85.7 kg)  01/15/16 190 lb 12.8 oz (86.5 kg)     Depression-   HTN-  BP Readings from Last 3 Encounters:  02/12/17 (!) 150/100  03/22/16 156/99  01/15/16 (!) 142/82   Skin rash- scalp and left side of neck.   Review of Systems  Constitutional: Positive for unexpected weight change.  HENT: Negative for hearing loss and rhinorrhea.   Eyes: Negative for visual disturbance.  Respiratory: Negative for cough.   Cardiovascular: Negative for leg swelling.  Gastrointestinal: Negative for blood in stool, constipation and diarrhea.  Genitourinary: Negative for dysuria.  Musculoskeletal: Negative for arthralgias and myalgias.  Skin: Negative for rash.  Neurological: Negative for headaches.  Hematological: Negative for adenopathy.  Psychiatric/Behavioral:       + stress, denies anxiety or depression    Past Medical History:  Diagnosis Date  . DUB (dysfunctional uterine bleeding)    Hx  . MHA (microangiopathic hemolytic anemia) (HCC)    Hx  . Night sweats    Hx  . Psoriasis of scalp      Social History   Social History  . Marital status: Legally Separated    Spouse name: N/A  . Number of children: N/A  . Years of education: N/A   Occupational History  . Not on file.   Social History Main Topics  . Smoking status: Never Smoker  . Smokeless tobacco: Never Used  . Alcohol use 0.0 oz/week     Comment: 1 drink per month  . Drug use: No  . Sexual activity: Yes    Birth control/ protection: Condom   Other Topics Concern  . Not on file   Social History Narrative   Recently  remarried   No children   Works in [employer]   Completed [place] up in [place]   Enjoys sleeping and shopping   Has a pitbull       Past Surgical History:  Procedure Laterality Date  . ACHILLES TENDON REPAIR Right 2010  . CERVICAL POLYPECTOMY N/A 05/19/2014   Procedure: CERVICAL POLYPECTOMY;  Surgeon: Sharene Butters, MD;  Location: [facility];  Service: Gynecology;  Laterality: N/A;  . HYSTEROSCOPY N/A 05/19/2014   Procedure: HYSTEROSCOPY;  Surgeon: Sharene Butters, MD;  Location: [facility];  Service: Gynecology;  Laterality: N/A;  . WISDOM TOOTH EXTRACTION      Family History  Problem Relation Age of Onset  . Hypertension Mother   . Glaucoma Father   . Blindness Father   . Hypertension Other   . Cancer Neg Hx   . Kidney disease Neg Hx   . Diabetes Neg Hx     No Known Allergies  No current outpatient prescriptions on file prior to visit.   No current facility-administered medications on file prior to visit.     BP (!) 150/100 (BP Location: Right Arm, Cuff Size: Large)   Pulse 90   Temp 98.1 F (36.7 C) (Oral)   Resp 18   Ht 5\' 4"  (1.626 m)   Wt 199 lb 6.4 oz (90.4 kg)  BMI 34.23 kg/m       Objective:   Physical Exam  Physical Exam  Constitutional: She is oriented to person, place, and time. She appears well-developed and well-nourished. No distress.  HENT:  Head: Normocephalic and atraumatic.  Right Ear: Tympanic membrane and ear canal normal.  Left Ear: Tympanic membrane and ear canal normal.  Mouth/Throat: Oropharynx is clear and moist.  Eyes: Pupils are equal, round, and reactive to light. No scleral icterus.  Neck: Normal range of motion. No thyromegaly present.  Cardiovascular: Normal rate and regular rhythm.   No murmur heard. Pulmonary/Chest: Effort normal and breath sounds normal. No respiratory distress. He has no wheezes. She has no rales. She exhibits no tenderness.  Abdominal: Soft. Bowel sounds are normal. She exhibits no distension and no  mass. There is no tenderness. There is no rebound and no guarding.  Musculoskeletal: She exhibits no edema.  Lymphadenopathy:    She has no cervical adenopathy.  Neurological: She is alert and oriented to person, place, and time. She has normal patellar reflexes. She exhibits normal muscle tone. Coordination normal.  Skin: Skin is warm and dry. raised annual lesions noted on left upper neck.  Psychiatric: She has a normal mood and affect. Her behavior is normal. Judgment and thought content normal.  Breasts: Examined lying Right: Without masses, retractions, discharge or axillary adenopathy.  Left: Without masses, retractions, discharge or axillary adenopathy.  Pelvic: deferred.           Assessment & Plan:         Assessment & Plan:  EKG tracing is personally reviewed.  EKG notes NSR.  No acute changes.   Depression- scored 18 on PHQ-9, feels like stress related >depression. Advised pt to schedule with a counselor.  Preventative care- discussed healthy diet, exercise, weight loss. Immunizations/pap up to date. Refer for mammo and obtain routine labs.   HTN- new, has had 3 elevated readings. Begin amlodipine. Follow up in 1 month.   Rash- appears fungal- trial of diflucan.

## 2017-02-12 NOTE — Patient Instructions (Addendum)
Begin amlodipine 5mg  once daily for blood pressure.  Focus on a low sodium diet, regular exercise and weight loss.  For rash, begin diflucan Please contact [facility] to schedule an appointment with a counselor [phone]

## 2017-02-13 ENCOUNTER — Encounter: Payer: Self-pay | Admitting: Family

## 2017-02-20 ENCOUNTER — Encounter (HOSPITAL_BASED_OUTPATIENT_CLINIC_OR_DEPARTMENT_OTHER): Payer: Self-pay | Admitting: Radiology

## 2017-02-20 ENCOUNTER — Ambulatory Visit (HOSPITAL_BASED_OUTPATIENT_CLINIC_OR_DEPARTMENT_OTHER)
Admission: RE | Admit: 2017-02-20 | Discharge: 2017-02-20 | Disposition: A | Discharge status: Home / Self Care | Payer: PRIVATE HEALTH INSURANCE | Source: Ambulatory Visit | Attending: Family | Admitting: Family

## 2017-02-20 DIAGNOSIS — Z1231 Encounter for screening mammogram for malignant neoplasm of breast: Secondary | ICD-10-CM | POA: Insufficient documentation

## 2017-02-20 DIAGNOSIS — Z0000 Encounter for general adult medical examination without abnormal findings: Secondary | ICD-10-CM | POA: Diagnosis not present

## 2017-07-02 ENCOUNTER — Encounter: Payer: Self-pay | Admitting: Family

## 2017-07-02 ENCOUNTER — Ambulatory Visit (INDEPENDENT_AMBULATORY_CARE_PROVIDER_SITE_OTHER): Payer: PRIVATE HEALTH INSURANCE | Admitting: Family

## 2017-07-02 ENCOUNTER — Other Ambulatory Visit (HOSPITAL_COMMUNITY)
Admission: RE | Admit: 2017-07-02 | Discharge: 2017-07-02 | Disposition: A | Discharge status: Home / Self Care | Payer: PRIVATE HEALTH INSURANCE | Source: Ambulatory Visit | Attending: Family | Admitting: Family

## 2017-07-02 VITALS — BP 135/98 | HR 91 | Temp 99.1°F | Resp 16 | Ht 64.0 in | Wt 201.0 lb

## 2017-07-02 DIAGNOSIS — N760 Acute vaginitis: Secondary | ICD-10-CM

## 2017-07-02 DIAGNOSIS — R05 Cough: Secondary | ICD-10-CM | POA: Diagnosis not present

## 2017-07-02 DIAGNOSIS — R030 Elevated blood-pressure reading, without diagnosis of hypertension: Secondary | ICD-10-CM | POA: Diagnosis not present

## 2017-07-02 DIAGNOSIS — J029 Acute pharyngitis, unspecified: Secondary | ICD-10-CM | POA: Diagnosis not present

## 2017-07-02 DIAGNOSIS — J069 Acute upper respiratory infection, unspecified: Secondary | ICD-10-CM | POA: Diagnosis not present

## 2017-07-02 DIAGNOSIS — B9789 Other viral agents as the cause of diseases classified elsewhere: Secondary | ICD-10-CM | POA: Diagnosis not present

## 2017-07-02 DIAGNOSIS — N898 Other specified noninflammatory disorders of vagina: Secondary | ICD-10-CM | POA: Diagnosis not present

## 2017-07-02 LAB — POCT RAPID STREP A (OFFICE): Rapid Strep A Screen: NEGATIVE

## 2017-07-02 MED ORDER — AMLODIPINE BESYLATE 5 MG PO TABS: 5.0000 mg | ORAL_TABLET | Freq: Every day | ORAL | 1 refills | Status: DC

## 2017-07-02 MED FILL — AMLODIPINE BESYLATE 5 MG TA: 5 | 30 days supply | Qty: 30 | Fill #0

## 2017-07-02 NOTE — Progress Notes (Signed)
Subjective:    Patient ID: Joanna Wright, female    DOB: 1971-07-25, 46 y.o.   MRN: [id number]  HPI   Pt presents today with several complaints:    Sore throat started last Tuesday. Reports + associated cough/ear pain.   Vaginal discharge- started Friday.  Yellow discharge.  No itching. No new partners.    Review of Systems See HPI  Past Medical History:  Diagnosis Date  . DUB (dysfunctional uterine bleeding)    Hx  . MHA (microangiopathic hemolytic anemia) (HCC)    Hx  . Night sweats    Hx  . Psoriasis of scalp      Social History   Social History  . Marital status: Legally Separated    Spouse name: N/A  . Number of children: N/A  . Years of education: N/A   Occupational History  . Not on file.   Social History Main Topics  . Smoking status: Never Smoker  . Smokeless tobacco: Never Used  . Alcohol use 0.0 oz/week     Comment: 1 drink per month  . Drug use: No  . Sexual activity: Yes    Birth control/ protection: Condom   Other Topics Concern  . Not on file   Social History Narrative   Recently remarried   No children   Works in [employer]   Completed [place] up in [place]   Enjoys sleeping and shopping   Has a pitbull       Past Surgical History:  Procedure Laterality Date  . ACHILLES TENDON REPAIR Right 2010  . CERVICAL POLYPECTOMY N/A 05/19/2014   Procedure: CERVICAL POLYPECTOMY;  Surgeon: Sharene Butters, MD;  Location: [facility];  Service: Gynecology;  Laterality: N/A;  . HYSTEROSCOPY N/A 05/19/2014   Procedure: HYSTEROSCOPY;  Surgeon: Sharene Butters, MD;  Location: [facility];  Service: Gynecology;  Laterality: N/A;  . WISDOM TOOTH EXTRACTION      Family History  Problem Relation Age of Onset  . Hypertension Mother   . Glaucoma Father   . Blindness Father   . Hypertension Other   . Cancer Neg Hx   . Kidney disease Neg Hx   . Diabetes Neg Hx     No Known Allergies  No current outpatient prescriptions on file prior to visit.   No  current facility-administered medications on file prior to visit.     BP (!) 135/98   Pulse 91   Temp 99.1 F (37.3 C) (Oral)   Resp 16   Ht 5\' 4"  (1.626 m)   Wt 201 lb (91.2 kg)   LMP 06/12/2017   SpO2 100%   BMI 34.50 kg/m       Objective:   Physical Exam  Constitutional: She appears well-developed and well-nourished.  HENT:  Head: Normocephalic and atraumatic.  Right Ear: Tympanic membrane and ear canal normal.  Left Ear: Tympanic membrane and ear canal normal.  Mouth/Throat: No oropharyngeal exudate, posterior oropharyngeal edema or posterior oropharyngeal erythema.  Cardiovascular: Normal rate, regular rhythm and normal heart sounds.   No murmur heard. Pulmonary/Chest: Effort normal and breath sounds normal. No respiratory distress. She has no wheezes.  Psychiatric: She has a normal mood and affect. Her behavior is normal. Judgment and thought content normal.  GU: normal external exam, no discharge noted.        Assessment & Plan:  Vaginitis- swab sent for GC/Chlamydia, BV, Yeast, trichomonas. Further recs pending review of results.   URI with cough- rapid  strep negative. Advised supportive measures and follow up if symptoms worsen or fail to improve in 3 days.  Elevated blood pressure- using otc decongestants.  Likely contributing to elevated BP. Advised discontinuation. Repeat bp in 2 weeks with RN.

## 2017-07-02 NOTE — Patient Instructions (Signed)
We will contact you with the results of your swab and further recommendations. All if cold symptoms worsen or if not improved in 3 days. Stop sudafed containing products.

## 2017-07-04 LAB — CERVICOVAGINAL ANCILLARY ONLY
Bacterial vaginitis: POSITIVE — AB
CANDIDA VAGINITIS: NEGATIVE
CHLAMYDIA, DNA PROBE: NEGATIVE
NEISSERIA GONORRHEA: NEGATIVE
TRICH (WINDOWPATH): NEGATIVE

## 2017-07-06 ENCOUNTER — Other Ambulatory Visit: Payer: Self-pay | Admitting: Family

## 2017-07-06 MED ORDER — METRONIDAZOLE 0.75 % VA GEL: 1.0000 | Freq: Every day | VAGINAL | 0 refills | Status: AC

## 2017-07-21 MED FILL — metroNIDAZOLE 0.75 % GEL: 0.75 | 7 days supply | Qty: 70 | Fill #0

## 2017-07-31 ENCOUNTER — Encounter: Payer: Self-pay | Admitting: Family

## 2017-07-31 MED ORDER — FLUCONAZOLE 150 MG PO TABS: 150.0000 mg | ORAL_TABLET | Freq: Once | ORAL | 0 refills | Status: AC

## 2017-07-31 MED FILL — FLUCONAZOLE 150 MG TABLET: 150 | 1 days supply | Qty: 1 | Fill #0

## 2017-08-01 ENCOUNTER — Encounter: Payer: Self-pay | Admitting: Family

## 2017-08-01 MED ORDER — METRONIDAZOLE 500 MG PO TABS: 500.0000 mg | ORAL_TABLET | Freq: Two times a day (BID) | ORAL | 0 refills | Status: DC

## 2017-08-01 MED FILL — metroNIDAZOLE 500 MG TABS: 500 | 7 days supply | Qty: 14 | Fill #0

## 2017-08-01 NOTE — Telephone Encounter (Signed)
Melissa-- please see email from yesterday as well and advise?

## 2017-08-01 NOTE — Telephone Encounter (Signed)
Spoke to pt. She reports that she stopped metrogel due to burning after 3 days. Advised pt to hold off on diflucan for now, take metrondizole bid x 14 days (no ETOH). If increased vaginal itching ok to take diflucan. Follow up in office if symptoms are not resolved in 1 week.

## 2017-10-31 MED FILL — AMLODIPINE BESYLATE 5 MG TA: 5 | 30 days supply | Qty: 30 | Fill #1

## 2017-12-10 ENCOUNTER — Encounter: Payer: Self-pay | Admitting: Family

## 2018-02-06 ENCOUNTER — Encounter: Payer: Self-pay | Admitting: Family

## 2018-02-06 ENCOUNTER — Ambulatory Visit (INDEPENDENT_AMBULATORY_CARE_PROVIDER_SITE_OTHER): Payer: PRIVATE HEALTH INSURANCE | Admitting: Family

## 2018-02-06 VITALS — BP 164/106 | HR 94 | Temp 98.6°F | Resp 16 | Ht 64.0 in | Wt 195.2 lb

## 2018-02-06 DIAGNOSIS — B9789 Other viral agents as the cause of diseases classified elsewhere: Secondary | ICD-10-CM

## 2018-02-06 DIAGNOSIS — I10 Essential (primary) hypertension: Secondary | ICD-10-CM | POA: Diagnosis not present

## 2018-02-06 DIAGNOSIS — Z0000 Encounter for general adult medical examination without abnormal findings: Secondary | ICD-10-CM

## 2018-02-06 DIAGNOSIS — J069 Acute upper respiratory infection, unspecified: Secondary | ICD-10-CM

## 2018-02-06 MED ORDER — AMLODIPINE BESYLATE 5 MG PO TABS: 5.0000 mg | ORAL_TABLET | Freq: Every day | ORAL | 1 refills | Status: DC

## 2018-02-06 MED FILL — AMLODIPINE BESYLATE 5 MG TA: 5 | 30 days supply | Qty: 30 | Fill #0

## 2018-02-06 NOTE — Progress Notes (Signed)
ek 

## 2018-02-06 NOTE — Progress Notes (Signed)
Subjective:    Patient ID: Joanna Wright, female    DOB: 1971/01/28, 47 y.o.   MRN: [id number]  HPI  Joanna Wright is a 47 yr old female who presents today for complete physical.  Patient presents today for complete physical.  Immunizations:tdap 2017 Diet: healthy Exercise:  Reports that that she stays busy but no formal exercise Pap Smear: 01/31/16 Mammogram: 02/20/18 Vision: last year Dental:  2019 Wt Readings from Last 3 Encounters:  02/06/18 195 lb 3.2 oz (88.5 kg)  07/02/17 201 lb (91.2 kg)  02/12/17 199 lb 6.4 oz (90.4 kg)     She also reports + nasal congestion and productive cough. Started with sore throat on Monday, then Tuesday developed cough, today has nasal congestion. Using sudafed. Denies fever.   HTN- has not take amlodipine in 1 week.  BP Readings from Last 3 Encounters:  02/06/18 (!) 164/106  07/02/17 (!) 135/98  02/12/17 (!) 150/100      Review of Systems  HENT: Positive for rhinorrhea.   Eyes: Negative for visual disturbance.  Respiratory: Positive for cough.   Cardiovascular: Negative for leg swelling.  Gastrointestinal: Negative for diarrhea, nausea and vomiting.  Genitourinary: Negative for dysuria, frequency and menstrual problem.  Musculoskeletal: Negative for arthralgias and myalgias.  Skin: Negative for rash.  Neurological: Negative for headaches.  Hematological: Negative for adenopathy.  Psychiatric/Behavioral:       Denies depression/anxiety     Past Medical History:  Diagnosis Date  . DUB (dysfunctional uterine bleeding)    Hx  . MHA (microangiopathic hemolytic anemia) (HCC)    Hx  . Night sweats    Hx  . Psoriasis of scalp      Social History   Socioeconomic History  . Marital status: Married    Spouse name: Not on file  . Number of children: Not on file  . Years of education: Not on file  . Highest education level: Not on file  Occupational History  . Not on file  Social Needs  . Financial resource strain: Not on  file  . Food insecurity:    Worry: Not on file    Inability: Not on file  . Transportation needs:    Medical: Not on file    Non-medical: Not on file  Tobacco Use  . Smoking status: Never Smoker  . Smokeless tobacco: Never Used  Substance and Sexual Activity  . Alcohol use: Yes    Alcohol/week: 0.0 oz    Comment: 1 drink per month  . Drug use: No  . Sexual activity: Yes    Birth control/protection: Condom  Lifestyle  . Physical activity:    Days per week: Not on file    Minutes per session: Not on file  . Stress: Not on file  Relationships  . Social connections:    Talks on phone: Not on file    Gets together: Not on file    Attends religious service: Not on file    Active member of club or organization: Not on file    Attends meetings of clubs or organizations: Not on file    Relationship status: Not on file  . Intimate partner violence:    Fear of current or ex partner: Not on file    Emotionally abused: Not on file    Physically abused: Not on file    Forced sexual activity: Not on file  Other Topics Concern  . Not on file  Social History Narrative   Recently remarried  No children   Works in [employer]   Completed [place] up in [place]   Enjoys sleeping and shopping   Has a pitbull    Past Surgical History:  Procedure Laterality Date  . ACHILLES TENDON REPAIR Right 2010  . CERVICAL POLYPECTOMY N/A 05/19/2014   Procedure: CERVICAL POLYPECTOMY;  Surgeon: Sharene Butters, MD;  Location: [facility];  Service: Gynecology;  Laterality: N/A;  . HYSTEROSCOPY N/A 05/19/2014   Procedure: HYSTEROSCOPY;  Surgeon: Sharene Butters, MD;  Location: [facility];  Service: Gynecology;  Laterality: N/A;  . WISDOM TOOTH EXTRACTION      Family History  Problem Relation Age of Onset  . Hypertension Mother   . Glaucoma Father   . Blindness Father   . Hypertension Other   . Cancer Neg Hx   . Kidney disease Neg Hx   . Diabetes Neg Hx     No Known Allergies  Current  Outpatient Medications on File Prior to Visit  Medication Sig Dispense Refill  . amLODipine (NORVASC) 5 MG tablet Take 1 tablet (5 mg total) by mouth daily. 90 tablet 1  . metroNIDAZOLE (FLAGYL) 500 MG tablet Take 1 tablet (500 mg total) 2 (two) times daily by mouth. 14 tablet 0   No current facility-administered medications on file prior to visit.     BP (!) 164/106   Pulse 94   Temp 98.6 F (37 C) (Oral)   Resp 16   Ht 5\' 4"  (1.626 m)   Wt 195 lb 3.2 oz (88.5 kg)   LMP 02/03/2018   SpO2 100%   BMI 33.51 kg/m    Objective:   Physical Exam Physical Exam  Constitutional: She is oriented to person, place, and time. She appears well-developed and well-nourished. No distress.  HENT:  Head: Normocephalic and atraumatic.  Right Ear: Tympanic membrane and ear canal normal.  Left Ear: Tympanic membrane and ear canal normal.  Mouth/Throat: Oropharynx is clear and moist.  Eyes: Pupils are equal, round, and reactive to light. No scleral icterus.  Neck: Normal range of motion. No thyromegaly present.  Cardiovascular: Normal rate and regular rhythm.   No murmur heard. Pulmonary/Chest: Effort normal and breath sounds normal. No respiratory distress. He has no wheezes. She has no rales. She exhibits no tenderness.  Abdominal: Soft. Bowel sounds are normal. She exhibits no distension and no mass. There is no tenderness. There is no rebound and no guarding.  Musculoskeletal: She exhibits no edema.  Lymphadenopathy:    She has no cervical adenopathy.  Neurological: She is alert and oriented to person, place, and time. She has normal patellar reflexes. She exhibits normal muscle tone. Coordination normal.  Skin: Skin is warm and dry.  Psychiatric: She has a normal mood and affect. Her behavior is normal. Judgment and thought content normal.  Breasts: Examined lying Right: Without masses, retractions, discharge or axillary adenopathy.  Left: Without masses, retractions, discharge or axillary  adenopathy.  Pelvic: deferred          Assessment & Plan:   Preventative care- Discussed healthy diet, exercise and weight loss. Refer for routine lab work, mammogram. Tetanus up to date.  HTN- Uncontrolled. Non-compliant with bp meds. Advised pt to restart bp meds, stop sudafed, follow up in 1 month.  Viral URI with cough- discussed supportive measures and to call if new/worsening symptoms or if symptom are not improved in 2-3 days.        Assessment & Plan:  EKG tracing is personally  reviewed.  EKG notes NSR.  No acute changes.

## 2018-02-06 NOTE — Patient Instructions (Signed)
Stop sudafed, you may use mucinex as needed for congestion. Restart amlodipine. Add 30 minutes of exercise 5 days a week. Continue to work on healthy diet and weight loss. Complete lab work prior to leaving. Schedule mammogram on the first flor.

## 2018-02-09 ENCOUNTER — Telehealth: Payer: Self-pay | Admitting: Family

## 2018-02-09 ENCOUNTER — Other Ambulatory Visit: Payer: Self-pay | Admitting: Family

## 2018-02-09 DIAGNOSIS — D649 Anemia, unspecified: Secondary | ICD-10-CM

## 2018-02-09 LAB — IRON: IRON: 19 ug/dL — AB (ref 40–190)

## 2018-02-09 LAB — CBC WITH DIFFERENTIAL/PLATELET
BASOS PCT: 0.7 %
Basophils Absolute: 32 cells/uL (ref 0–200)
EOS PCT: 4.7 %
Eosinophils Absolute: 212 cells/uL (ref 15–500)
HCT: 31 % — ABNORMAL LOW (ref 35.0–45.0)
Hemoglobin: 10.2 g/dL — ABNORMAL LOW (ref 11.7–15.5)
Lymphs Abs: 1247 cells/uL (ref 850–3900)
MCH: 26.5 pg — ABNORMAL LOW (ref 27.0–33.0)
MCHC: 32.9 g/dL (ref 32.0–36.0)
MCV: 80.5 fL (ref 80.0–100.0)
MONOS PCT: 10.7 %
MPV: 9.9 fL (ref 7.5–12.5)
NEUTROS PCT: 56.2 %
Neutro Abs: 2529 cells/uL (ref 1500–7800)
PLATELETS: 378 10*3/uL (ref 140–400)
RBC: 3.85 10*6/uL (ref 3.80–5.10)
RDW: 15.4 % — AB (ref 11.0–15.0)
TOTAL LYMPHOCYTE: 27.7 %
WBC mixed population: 482 cells/uL (ref 200–950)
WBC: 4.5 10*3/uL (ref 3.8–10.8)

## 2018-02-09 LAB — HEPATIC FUNCTION PANEL
AG Ratio: 1.5 (calc) (ref 1.0–2.5)
ALBUMIN MSPROF: 4.2 g/dL (ref 3.6–5.1)
ALT: 9 U/L (ref 6–29)
AST: 13 U/L (ref 10–35)
Alkaline phosphatase (APISO): 66 U/L (ref 33–115)
BILIRUBIN DIRECT: 0.1 mg/dL (ref 0.0–0.2)
BILIRUBIN INDIRECT: 0.1 mg/dL — AB (ref 0.2–1.2)
Globulin: 2.8 g/dL (calc) (ref 1.9–3.7)
TOTAL PROTEIN: 7 g/dL (ref 6.1–8.1)
Total Bilirubin: 0.2 mg/dL (ref 0.2–1.2)

## 2018-02-09 LAB — LIPID PANEL
CHOLESTEROL: 202 mg/dL — AB (ref <200)
HDL: 67 mg/dL (ref >50)
LDL Cholesterol (Calc): 120 mg/dL (calc) — ABNORMAL HIGH
Non-HDL Cholesterol (Calc): 135 mg/dL (calc) — ABNORMAL HIGH (ref <130)
Total CHOL/HDL Ratio: 3 (calc) (ref <5.0)
Triglycerides: 61 mg/dL (ref <150)

## 2018-02-09 LAB — URINALYSIS, ROUTINE W REFLEX MICROSCOPIC
BILIRUBIN URINE: NEGATIVE
GLUCOSE, UA: NEGATIVE
Hyaline Cast: NONE SEEN /LPF
KETONES UR: NEGATIVE
NITRITE: NEGATIVE
PH: 7 (ref 5.0–8.0)
Protein, ur: NEGATIVE
SPECIFIC GRAVITY, URINE: 1.007 (ref 1.001–1.03)

## 2018-02-09 LAB — BASIC METABOLIC PANEL
BUN: 7 mg/dL (ref 7–25)
CALCIUM: 8.7 mg/dL (ref 8.6–10.2)
CO2: 24 mmol/L (ref 20–32)
Chloride: 105 mmol/L (ref 98–110)
Creat: 0.76 mg/dL (ref 0.50–1.10)
GLUCOSE: 91 mg/dL (ref 65–99)
Potassium: 3.6 mmol/L (ref 3.5–5.3)
SODIUM: 137 mmol/L (ref 135–146)

## 2018-02-09 LAB — TEST AUTHORIZATION

## 2018-02-09 LAB — TSH: TSH: 1.3 mIU/L

## 2018-02-09 LAB — FERRITIN: Ferritin: 10 ng/mL (ref 10–232)

## 2018-02-09 MED ORDER — FERROUS SULFATE 325 (65 FE) MG PO TABS: 325.0000 mg | ORAL_TABLET | Freq: Two times a day (BID) | ORAL | 3 refills | Status: DC

## 2018-02-09 NOTE — Progress Notes (Signed)
ifo 

## 2018-02-09 NOTE — Telephone Encounter (Signed)
Iron level is low. Please ask pt to add iron 325mg  bid. Repeat cbc, ferritin, serum iron in 3 months.

## 2018-02-10 NOTE — Telephone Encounter (Signed)
Called with results/instructions//left message to call back.

## 2018-02-11 ENCOUNTER — Other Ambulatory Visit: Payer: Self-pay | Admitting: Family

## 2018-02-11 DIAGNOSIS — E611 Iron deficiency: Secondary | ICD-10-CM

## 2018-02-11 NOTE — Telephone Encounter (Signed)
Called and informed the patient of results/instructions. Scheduled 3 month lab followup/put in order. The patient verbalized understanding/results/instructions.

## 2018-02-11 NOTE — Progress Notes (Signed)
cb

## 2018-02-28 ENCOUNTER — Ambulatory Visit (HOSPITAL_BASED_OUTPATIENT_CLINIC_OR_DEPARTMENT_OTHER)
Admission: RE | Admit: 2018-02-28 | Discharge: 2018-02-28 | Disposition: A | Discharge status: Home / Self Care | Payer: PRIVATE HEALTH INSURANCE | Source: Ambulatory Visit | Attending: Family | Admitting: Family

## 2018-02-28 DIAGNOSIS — Z0000 Encounter for general adult medical examination without abnormal findings: Secondary | ICD-10-CM | POA: Diagnosis not present

## 2018-03-09 ENCOUNTER — Ambulatory Visit: Payer: PRIVATE HEALTH INSURANCE | Admitting: Family

## 2018-03-09 ENCOUNTER — Encounter: Payer: Self-pay | Admitting: Family

## 2018-03-09 VITALS — BP 136/79 | HR 90 | Temp 99.1°F | Resp 18 | Ht 64.0 in | Wt 193.2 lb

## 2018-03-09 DIAGNOSIS — D509 Iron deficiency anemia, unspecified: Secondary | ICD-10-CM

## 2018-03-09 DIAGNOSIS — I10 Essential (primary) hypertension: Secondary | ICD-10-CM

## 2018-03-09 NOTE — Progress Notes (Signed)
   Subjective:    Patient ID: Joanna Wright, female    DOB: 1971/08/08, 47 y.o.   MRN: [id number]  HPI  Patient is a 47 year old female who presents today for follow-up of her hypertension.  Last visit she was not taking her amlodipine regularly.  She also been using some Sudafed-containing products.  She was advised to discontinue Sudafed and to resume daily amlodipine.  She reports tolerating amlodipine without difficulty.  She denies lower extremity edema.  Reports feeling "great."  BP Readings from Last 3 Encounters:  03/09/18 136/79  02/06/18 (!) 164/106  07/02/17 (!) 135/98      Review of Systems     Objective:   Physical Exam  Constitutional: She appears well-developed and well-nourished.  Cardiovascular: Normal rate, regular rhythm and normal heart sounds.  No murmur heard. Pulmonary/Chest: Effort normal and breath sounds normal. No respiratory distress. She has no wheezes.  Psychiatric: She has a normal mood and affect. Her behavior is normal. Judgment and thought content normal.          Assessment & Plan:  Hypertension-blood pressure is much improved.  I have advised patient to continue her current dose of amlodipine.  Iron deficiency anemia-patient plans to begin iron supplements.

## 2018-05-14 ENCOUNTER — Other Ambulatory Visit (INDEPENDENT_AMBULATORY_CARE_PROVIDER_SITE_OTHER): Payer: PRIVATE HEALTH INSURANCE

## 2018-05-14 DIAGNOSIS — E611 Iron deficiency: Secondary | ICD-10-CM | POA: Diagnosis not present

## 2018-05-14 LAB — CBC
HCT: 32.8 % — ABNORMAL LOW (ref 36.0–46.0)
Hemoglobin: 10.6 g/dL — ABNORMAL LOW (ref 12.0–15.0)
MCHC: 32.4 g/dL (ref 30.0–36.0)
MCV: 83.5 fl (ref 78.0–100.0)
PLATELETS: 358 10*3/uL (ref 150.0–400.0)
RBC: 3.93 Mil/uL (ref 3.87–5.11)
RDW: 15.3 % (ref 11.5–15.5)
WBC: 4.1 10*3/uL (ref 4.0–10.5)

## 2018-05-14 LAB — FERRITIN: FERRITIN: 6.9 ng/mL — AB (ref 10.0–291.0)

## 2018-05-14 LAB — IRON: IRON: 24 ug/dL — AB (ref 42–145)

## 2018-05-17 ENCOUNTER — Telehealth: Payer: Self-pay | Admitting: Family

## 2018-05-17 NOTE — Telephone Encounter (Signed)
See mychart.  

## 2018-05-24 MED ORDER — AMLODIPINE BESYLATE 5 MG PO TABS: 5.0000 mg | ORAL_TABLET | Freq: Every day | ORAL | 1 refills | Status: DC

## 2018-05-24 NOTE — Addendum Note (Signed)
Addended by: [name] on: 05/24/2018 10:06 AM   Modules accepted: Orders

## 2018-06-11 MED FILL — AMLODIPINE BESYLATE 5 MG TA: 5 | 30 days supply | Qty: 30 | Fill #0

## 2018-09-10 ENCOUNTER — Telehealth: Payer: Self-pay | Admitting: *Deleted

## 2018-09-10 NOTE — Telephone Encounter (Signed)
Received Physician Orders from [facility]; forwarded to provider/SLS 12/19

## 2018-09-11 ENCOUNTER — Telehealth: Payer: Self-pay | Admitting: *Deleted

## 2018-09-11 NOTE — Telephone Encounter (Signed)
Received Physician Orders/RD Progress Note from [facility]; forwarded to provider/SLS 12/20

## 2018-10-15 ENCOUNTER — Encounter: Payer: Self-pay | Admitting: Family

## 2018-10-15 MED ORDER — AMLODIPINE BESYLATE 5 MG PO TABS: 5.0000 mg | ORAL_TABLET | Freq: Every day | ORAL | 1 refills | Status: DC

## 2018-10-15 MED FILL — AMLODIPINE BESYLATE 5 MG TA: 5 | 30 days supply | Qty: 30 | Fill #0

## 2018-11-25 MED FILL — BENZONATATE 200 MG CAPS: 200 | 10 days supply | Qty: 30 | Fill #0

## 2018-11-25 MED FILL — IPRATROPIUM 0.03% SPRAY: 0.03 | 30 days supply | Qty: 30 | Fill #0

## 2018-12-23 ENCOUNTER — Telehealth: Payer: Self-pay | Admitting: Family

## 2018-12-23 NOTE — Telephone Encounter (Signed)
Attempted to reach pt and left message to check mychart acct. Message sent. 

## 2018-12-23 NOTE — Telephone Encounter (Signed)
Please contact pt and let her know that our records show she is due for HTN follow up. Please offer her a virtual visit.

## 2018-12-29 NOTE — Telephone Encounter (Signed)
Patient advised she is due for follow up, she does not want to schedule virtual visit at this time.

## 2019-01-27 ENCOUNTER — Encounter: Payer: Self-pay | Admitting: Family

## 2019-01-27 MED FILL — AMLODIPINE BESYLATE 5 MG TA: 5 | 30 days supply | Qty: 30 | Fill #1

## 2019-03-10 ENCOUNTER — Encounter: Payer: Self-pay | Admitting: Family

## 2019-03-10 ENCOUNTER — Ambulatory Visit (INDEPENDENT_AMBULATORY_CARE_PROVIDER_SITE_OTHER): Payer: PRIVATE HEALTH INSURANCE | Admitting: Family

## 2019-03-10 ENCOUNTER — Other Ambulatory Visit: Payer: Self-pay

## 2019-03-10 ENCOUNTER — Other Ambulatory Visit (HOSPITAL_COMMUNITY)
Admission: RE | Admit: 2019-03-10 | Discharge: 2019-03-10 | Disposition: A | Discharge status: Home / Self Care | Payer: PRIVATE HEALTH INSURANCE | Source: Ambulatory Visit | Attending: Family | Admitting: Family

## 2019-03-10 VITALS — BP 154/109 | HR 91 | Temp 99.8°F | Resp 16 | Ht 63.5 in | Wt 194.2 lb

## 2019-03-10 DIAGNOSIS — D509 Iron deficiency anemia, unspecified: Secondary | ICD-10-CM | POA: Diagnosis not present

## 2019-03-10 DIAGNOSIS — Z0001 Encounter for general adult medical examination with abnormal findings: Secondary | ICD-10-CM | POA: Diagnosis not present

## 2019-03-10 DIAGNOSIS — Z30011 Encounter for initial prescription of contraceptive pills: Secondary | ICD-10-CM

## 2019-03-10 DIAGNOSIS — Z0000 Encounter for general adult medical examination without abnormal findings: Secondary | ICD-10-CM

## 2019-03-10 DIAGNOSIS — I10 Essential (primary) hypertension: Secondary | ICD-10-CM

## 2019-03-10 DIAGNOSIS — Z01419 Encounter for gynecological examination (general) (routine) without abnormal findings: Secondary | ICD-10-CM

## 2019-03-10 LAB — CBC WITH DIFFERENTIAL/PLATELET
Basophils Absolute: 0 10*3/uL (ref 0.0–0.1)
Basophils Relative: 0.8 % (ref 0.0–3.0)
Eosinophils Absolute: 0.1 10*3/uL (ref 0.0–0.7)
Eosinophils Relative: 2.3 % (ref 0.0–5.0)
HCT: 33.9 % — ABNORMAL LOW (ref 36.0–46.0)
Hemoglobin: 11.2 g/dL — ABNORMAL LOW (ref 12.0–15.0)
Lymphocytes Relative: 30.4 % (ref 12.0–46.0)
Lymphs Abs: 1 10*3/uL (ref 0.7–4.0)
MCHC: 33 g/dL (ref 30.0–36.0)
MCV: 85.4 fl (ref 78.0–100.0)
Monocytes Absolute: 0.4 10*3/uL (ref 0.1–1.0)
Monocytes Relative: 11.3 % (ref 3.0–12.0)
Neutro Abs: 1.8 10*3/uL (ref 1.4–7.7)
Neutrophils Relative %: 55.2 % (ref 43.0–77.0)
Platelets: 304 10*3/uL (ref 150.0–400.0)
RBC: 3.97 Mil/uL (ref 3.87–5.11)
RDW: 16.1 % — ABNORMAL HIGH (ref 11.5–15.5)
WBC: 3.2 10*3/uL — ABNORMAL LOW (ref 4.0–10.5)

## 2019-03-10 LAB — LIPID PANEL
Cholesterol: 200 mg/dL (ref 0–200)
HDL: 70.7 mg/dL (ref >39.00)
LDL Cholesterol: 119 mg/dL — ABNORMAL HIGH (ref 0–99)
NonHDL: 128.96
Total CHOL/HDL Ratio: 3
Triglycerides: 49 mg/dL (ref 0.0–149.0)
VLDL: 9.8 mg/dL (ref 0.0–40.0)

## 2019-03-10 LAB — TSH: TSH: 1.55 u[IU]/mL (ref 0.35–4.50)

## 2019-03-10 LAB — HEPATIC FUNCTION PANEL
ALT: 8 U/L (ref 0–35)
AST: 10 U/L (ref 0–37)
Albumin: 4.2 g/dL (ref 3.5–5.2)
Alkaline Phosphatase: 53 U/L (ref 39–117)
Bilirubin, Direct: 0.1 mg/dL (ref 0.0–0.3)
Total Bilirubin: 0.4 mg/dL (ref 0.2–1.2)
Total Protein: 6.7 g/dL (ref 6.0–8.3)

## 2019-03-10 LAB — BASIC METABOLIC PANEL
BUN: 12 mg/dL (ref 6–23)
CO2: 25 mEq/L (ref 19–32)
Calcium: 9 mg/dL (ref 8.4–10.5)
Chloride: 104 mEq/L (ref 96–112)
Creatinine, Ser: 0.72 mg/dL (ref 0.40–1.20)
GFR: 104.64 mL/min (ref >60.00)
Glucose, Bld: 81 mg/dL (ref 70–99)
Potassium: 4.1 mEq/L (ref 3.5–5.1)
Sodium: 138 mEq/L (ref 135–145)

## 2019-03-10 LAB — IRON: Iron: 58 ug/dL (ref 42–145)

## 2019-03-10 LAB — FERRITIN: Ferritin: 6.4 ng/mL — ABNORMAL LOW (ref 10.0–291.0)

## 2019-03-10 LAB — POCT URINE HCG BY VISUAL COLOR COMPARISON TESTS: Preg Test, Ur: NEGATIVE

## 2019-03-10 MED ORDER — AMLODIPINE BESYLATE 10 MG PO TABS: 10.0000 mg | ORAL_TABLET | Freq: Every day | ORAL | 1 refills | Status: DC

## 2019-03-10 MED ORDER — NORETHIN ACE-ETH ESTRAD-FE 1.5-30 MG-MCG PO TABS: 1.0000 | ORAL_TABLET | Freq: Every day | ORAL | 11 refills | Status: DC

## 2019-03-10 MED FILL — AMLODIPINE BESYLATE 10 MG T: 10 | 30 days supply | Qty: 30 | Fill #0

## 2019-03-10 MED FILL — LARIN FE 1.5-30 TABLET: 1.5-30 | 28 days supply | Qty: 28 | Fill #0

## 2019-03-10 NOTE — Patient Instructions (Addendum)
Continue to work on [employer], exercise and weight loss. Increase amlodipine form 5mg  to 10mg  once daily. Check blood pressure several times a week for the next 1-2 weeks and send me your results via mychart.

## 2019-03-10 NOTE — Progress Notes (Signed)
Subjective:    Patient ID: Joanna Wright, female    DOB: 28-Jan-1971, 48 y.o.   MRN: [id number]  HPI  Patient presents today for complete physical.  Immunizations:  tdap 2017 Diet: trying the keto diet Wt Readings from Last 3 Encounters:  03/10/19 194 lb 3.2 oz (88.1 kg)  03/09/18 193 lb 3.2 oz (87.6 kg)  02/06/18 195 lb 3.2 oz (88.5 kg)  Exercise: not exercising Colonoscopy: will begin at 50 Pap Smear:  01/31/16 Mammogram: 02/28/18 Vision: last year Dental: 10/19  HTN- reports that she takes amlodipine 5mg  once daily in the AM and reports good compliance.  BP Readings from Last 3 Encounters:  03/10/19 (!) 154/109  03/09/18 136/79  02/06/18 (!) 164/106   Reports that she is using iron (liquid) 1 teaspoon once daily, reports heavy irregular periods Lab Results  Component Value Date   WBC 4.1 05/14/2018   HGB 10.6 (L) 05/14/2018   HCT 32.8 (L) 05/14/2018   MCV 83.5 05/14/2018   PLT 358.0 05/14/2018      Review of Systems  Constitutional: Negative for unexpected weight change.  HENT: Negative for hearing loss and rhinorrhea.   Eyes: Negative for visual disturbance.  Respiratory: Negative for cough and shortness of breath.   Cardiovascular: Negative for chest pain and leg swelling.  Gastrointestinal: Negative for constipation and diarrhea.  Genitourinary: Negative for dysuria, frequency and hematuria.  Musculoskeletal: Negative for arthralgias and myalgias.  Skin: Negative for rash.  Neurological: Negative for headaches.  Hematological: Negative for adenopathy.  Psychiatric/Behavioral:       Denies depression/anxiety   Past Medical History:  Diagnosis Date  . DUB (dysfunctional uterine bleeding)    Hx  . MHA (microangiopathic hemolytic anemia) (HCC)    Hx  . Night sweats    Hx  . Psoriasis of scalp      Social History   Socioeconomic History  . Marital status: Married    Spouse name: Not on file  . Number of children: Not on file  . Years of  education: Not on file  . Highest education level: Not on file  Occupational History  . Not on file  Social Needs  . Financial resource strain: Not on file  . Food insecurity    Worry: Not on file    Inability: Not on file  . Transportation needs    Medical: Not on file    Non-medical: Not on file  Tobacco Use  . Smoking status: Never Smoker  . Smokeless tobacco: Never Used  Substance and Sexual Activity  . Alcohol use: Yes    Alcohol/week: 0.0 standard drinks    Comment: 1 drink per month  . Drug use: No  . Sexual activity: Yes    Birth control/protection: Condom  Lifestyle  . Physical activity    Days per week: Not on file    Minutes per session: Not on file  . Stress: Not on file  Relationships  . Social Herbalist on phone: Not on file    Gets together: Not on file    Attends religious service: Not on file    Active member of club or organization: Not on file    Attends meetings of clubs or organizations: Not on file    Relationship status: Not on file  . Intimate partner violence    Fear of current or ex partner: Not on file    Emotionally abused: Not on file    Physically abused: Not on  file    Forced sexual activity: Not on file  Other Topics Concern  . Not on file  Social History Narrative   Recently remarried   No children   Works in [employer]   Completed [place] up in [place]   Enjoys sleeping and shopping   Has a pitbull    Past Surgical History:  Procedure Laterality Date  . ACHILLES TENDON REPAIR Right 2010  . CERVICAL POLYPECTOMY N/A 05/19/2014   Procedure: CERVICAL POLYPECTOMY;  Surgeon: Sharene Butters, MD;  Location: [facility];  Service: Gynecology;  Laterality: N/A;  . HYSTEROSCOPY N/A 05/19/2014   Procedure: HYSTEROSCOPY;  Surgeon: Sharene Butters, MD;  Location: [facility];  Service: Gynecology;  Laterality: N/A;  . WISDOM TOOTH EXTRACTION      Family History  Problem Relation Age of Onset  . Hypertension Mother   . Glaucoma  Father   . Blindness Father   . Hypertension Other   . Cancer Neg Hx   . Kidney disease Neg Hx   . Diabetes Neg Hx     No Known Allergies  Current Outpatient Medications on File Prior to Visit  Medication Sig Dispense Refill  . ferrous sulfate 325 (65 FE) MG tablet Take 1 tablet (325 mg total) by mouth 2 (two) times daily with a meal.  3   No current facility-administered medications on file prior to visit.     BP (!) 154/109 (BP Location: Left Arm, Patient Position: Sitting, Cuff Size: Large)   Pulse 91   Temp 99.8 F (37.7 C) (Oral)   Resp 16   Ht 5' 3.5" (1.613 m)   Wt 194 lb 3.2 oz (88.1 kg)   LMP 02/15/2019 (Approximate)   SpO2 100%   BMI 33.86 kg/m       Objective:   Physical Exam  Physical Exam  Constitutional: She is oriented to person, place, and time. She appears well-developed and well-nourished. No distress.  HENT:  Head: Normocephalic and atraumatic.  Right Ear: Tympanic membrane and ear canal normal.  Left Ear: Tympanic membrane and ear canal normal.  Mouth/Throat: Oropharynx is clear and moist.  Eyes: Pupils are equal, round, and reactive to light. No scleral icterus.  Neck: Normal range of motion. No thyromegaly present.  Cardiovascular: Normal rate and regular rhythm.   No murmur heard. Pulmonary/Chest: Effort normal and breath sounds normal. No respiratory distress. He has no wheezes. She has no rales. She exhibits no tenderness.  Abdominal: Soft. Bowel sounds are normal. She exhibits no distension and no mass. There is no tenderness. There is no rebound and no guarding.  Musculoskeletal: She exhibits no edema.  Lymphadenopathy:    She has no cervical adenopathy.  Neurological: She is alert and oriented to person, place, and time. She has normal patellar reflexes. She exhibits normal muscle tone. Coordination normal.  Skin: Skin is warm and dry.  Psychiatric: She has a normal mood and affect. Her behavior is normal. Judgment and thought content  normal.  Breasts: Examined lying Right: Without masses, retractions, discharge or axillary adenopathy.  Left: Without masses, retractions, discharge or axillary adenopathy.  Inguinal/mons: Normal without inguinal adenopathy  External genitalia: Normal  BUS/Urethra/Skene's glands: Normal  Bladder: Normal  Vagina: Normal  Cervix: Normal  Uterus: normal in size, shape and contour. Midline and mobile  Adnexa/parametria:  Rt: Without masses or tenderness.  Lt: Without masses or tenderness.  Anus and perineum: Normal            Assessment &  Plan:         Assessment & Plan:  HTN- BP is elevated.  Will increase amlodipine from 5mg  ot 10mg . Patient is advised to check blood pressure several times a week over the next 1-2 weeks and send me her readings via mychart.   Preventative care- discussed healthy diet, exercise. Refer for mammogram. Pap with HPV performed today. Obtain follow up lab work.  Iron deficiency anemia- obtain cbc, iron, ferritin, continue iron supplement.

## 2019-03-12 LAB — CYTOLOGY - PAP
Diagnosis: NEGATIVE
HPV: NOT DETECTED

## 2019-03-17 ENCOUNTER — Ambulatory Visit (HOSPITAL_BASED_OUTPATIENT_CLINIC_OR_DEPARTMENT_OTHER): Payer: PRIVATE HEALTH INSURANCE

## 2019-03-17 ENCOUNTER — Encounter: Payer: Self-pay | Admitting: Family

## 2019-03-23 ENCOUNTER — Other Ambulatory Visit: Payer: Self-pay

## 2019-03-23 ENCOUNTER — Ambulatory Visit (HOSPITAL_BASED_OUTPATIENT_CLINIC_OR_DEPARTMENT_OTHER)
Admission: RE | Admit: 2019-03-23 | Discharge: 2019-03-23 | Disposition: A | Discharge status: Home / Self Care | Payer: PRIVATE HEALTH INSURANCE | Source: Ambulatory Visit | Attending: Family | Admitting: Family

## 2019-03-23 ENCOUNTER — Encounter (HOSPITAL_BASED_OUTPATIENT_CLINIC_OR_DEPARTMENT_OTHER): Payer: Self-pay

## 2019-03-23 DIAGNOSIS — Z0000 Encounter for general adult medical examination without abnormal findings: Secondary | ICD-10-CM

## 2019-03-23 DIAGNOSIS — Z1231 Encounter for screening mammogram for malignant neoplasm of breast: Secondary | ICD-10-CM | POA: Diagnosis present

## 2019-03-24 ENCOUNTER — Other Ambulatory Visit: Payer: Self-pay | Admitting: Family

## 2019-03-24 DIAGNOSIS — R928 Other abnormal and inconclusive findings on diagnostic imaging of breast: Secondary | ICD-10-CM

## 2019-03-30 ENCOUNTER — Ambulatory Visit
Admission: RE | Admit: 2019-03-30 | Discharge: 2019-03-30 | Disposition: A | Discharge status: Home / Self Care | Payer: PRIVATE HEALTH INSURANCE | Source: Ambulatory Visit | Attending: Family | Admitting: Family

## 2019-03-30 ENCOUNTER — Other Ambulatory Visit: Payer: Self-pay | Admitting: Family

## 2019-03-30 DIAGNOSIS — N6310 Unspecified lump in the right breast, unspecified quadrant: Secondary | ICD-10-CM

## 2019-03-30 DIAGNOSIS — R928 Other abnormal and inconclusive findings on diagnostic imaging of breast: Secondary | ICD-10-CM

## 2019-04-06 ENCOUNTER — Ambulatory Visit
Admission: RE | Admit: 2019-04-06 | Discharge: 2019-04-06 | Disposition: A | Discharge status: Home / Self Care | Payer: PRIVATE HEALTH INSURANCE | Source: Ambulatory Visit | Attending: Family | Admitting: Family

## 2019-04-06 ENCOUNTER — Other Ambulatory Visit: Payer: Self-pay

## 2019-04-06 DIAGNOSIS — N6310 Unspecified lump in the right breast, unspecified quadrant: Secondary | ICD-10-CM

## 2019-04-06 HISTORY — PX: BREAST BIOPSY: SHX20

## 2019-04-16 MED FILL — AMLODIPINE BESYLATE 10 MG T: 10 | 30 days supply | Qty: 30 | Fill #1

## 2019-04-16 MED FILL — LARIN FE 1.5-30 TABLET: 1.5-30 | 28 days supply | Qty: 28 | Fill #1

## 2019-05-13 MED FILL — LARIN FE 1.5-30 TABLET: 1.5-30 | 28 days supply | Qty: 28 | Fill #2

## 2019-06-02 ENCOUNTER — Encounter: Payer: Self-pay | Admitting: Family

## 2019-06-08 ENCOUNTER — Other Ambulatory Visit: Payer: Self-pay

## 2019-06-09 ENCOUNTER — Ambulatory Visit: Payer: PRIVATE HEALTH INSURANCE | Admitting: Family

## 2019-06-09 VITALS — BP 128/84 | HR 82 | Temp 97.4°F | Resp 16 | Ht 63.5 in | Wt 196.0 lb

## 2019-06-09 DIAGNOSIS — I10 Essential (primary) hypertension: Secondary | ICD-10-CM

## 2019-06-09 DIAGNOSIS — L309 Dermatitis, unspecified: Secondary | ICD-10-CM | POA: Diagnosis not present

## 2019-06-09 DIAGNOSIS — D509 Iron deficiency anemia, unspecified: Secondary | ICD-10-CM

## 2019-06-09 LAB — CBC WITH DIFFERENTIAL/PLATELET
Basophils Absolute: 0 10*3/uL (ref 0.0–0.1)
Basophils Relative: 0.7 % (ref 0.0–3.0)
Eosinophils Absolute: 0.1 10*3/uL (ref 0.0–0.7)
Eosinophils Relative: 1.5 % (ref 0.0–5.0)
HCT: 34.4 % — ABNORMAL LOW (ref 36.0–46.0)
Hemoglobin: 11.3 g/dL — ABNORMAL LOW (ref 12.0–15.0)
Lymphocytes Relative: 21.1 % (ref 12.0–46.0)
Lymphs Abs: 1 10*3/uL (ref 0.7–4.0)
MCHC: 32.7 g/dL (ref 30.0–36.0)
MCV: 89 fl (ref 78.0–100.0)
Monocytes Absolute: 0.4 10*3/uL (ref 0.1–1.0)
Monocytes Relative: 7.9 % (ref 3.0–12.0)
Neutro Abs: 3.4 10*3/uL (ref 1.4–7.7)
Neutrophils Relative %: 68.8 % (ref 43.0–77.0)
Platelets: 400 10*3/uL (ref 150.0–400.0)
RBC: 3.87 Mil/uL (ref 3.87–5.11)
RDW: 14.9 % (ref 11.5–15.5)
WBC: 4.9 10*3/uL (ref 4.0–10.5)

## 2019-06-09 LAB — FERRITIN: Ferritin: 6.3 ng/mL — ABNORMAL LOW (ref 10.0–291.0)

## 2019-06-09 LAB — IRON: Iron: 116 ug/dL (ref 42–145)

## 2019-06-09 MED ORDER — BETAMETHASONE VALERATE 0.1 % EX OINT: 1.0000 "application " | TOPICAL_OINTMENT | Freq: Two times a day (BID) | CUTANEOUS | 0 refills | Status: DC

## 2019-06-09 MED FILL — LARIN FE 1.5-30 TABLET: 1.5-30 | 28 days supply | Qty: 28 | Fill #3

## 2019-06-09 MED FILL — AMLODIPINE BESYLATE 10 MG T: 10 | 30 days supply | Qty: 30 | Fill #2

## 2019-06-09 MED FILL — BETAMETHASONE VALER 0.1% OI: 0.1 | 15 days supply | Qty: 30 | Fill #0

## 2019-06-09 NOTE — Patient Instructions (Signed)
Apply cream twice daily as needed for rash on the back of your neck.

## 2019-06-09 NOTE — Progress Notes (Signed)
Subjective:    Patient ID: Joanna Wright, female    DOB: [birth year], 48 y.o.   MRN: [id number]  HPI  Patient is a 48 yr old female who presents today for follow up.  HTN- she is maintained on amlodipine. Denies HA's. Only trace swelling.  BP Readings from Last 3 Encounters:  06/09/19 128/84  03/10/19 (!) 154/109  03/09/18 136/79   Iron deficiency anemia- Taking iron liquid. No constipation.   Lab Results  Component Value Date   WBC 3.2 (L) 03/10/2019   HGB 11.2 (L) 03/10/2019   HCT 33.9 (L) 03/10/2019   MCV 85.4 03/10/2019   PLT 304.0 03/10/2019   Skin rash- notes itchy rash on back on neck.    Review of Systems    see HPI  Past Medical History:  Diagnosis Date  . DUB (dysfunctional uterine bleeding)    Hx  . MHA (microangiopathic hemolytic anemia) (HCC)    Hx  . Night sweats    Hx  . Psoriasis of scalp      Social History   Socioeconomic History  . Marital status: Married    Spouse name: Not on file  . Number of children: Not on file  . Years of education: Not on file  . Highest education level: Not on file  Occupational History  . Not on file  Social Needs  . Financial resource strain: Not on file  . Food insecurity    Worry: Not on file    Inability: Not on file  . Transportation needs    Medical: Not on file    Non-medical: Not on file  Tobacco Use  . Smoking status: Never Smoker  . Smokeless tobacco: Never Used  Substance and Sexual Activity  . Alcohol use: Yes    Alcohol/week: 0.0 standard drinks    Comment: 1 drink per month  . Drug use: No  . Sexual activity: Yes    Birth control/protection: Condom  Lifestyle  . Physical activity    Days per week: Not on file    Minutes per session: Not on file  . Stress: Not on file  Relationships  . Social Herbalist on phone: Not on file    Gets together: Not on file    Attends religious service: Not on file    Active member of club or organization: Not on file    Attends  meetings of clubs or organizations: Not on file    Relationship status: Not on file  . Intimate partner violence    Fear of current or ex partner: Not on file    Emotionally abused: Not on file    Physically abused: Not on file    Forced sexual activity: Not on file  Other Topics Concern  . Not on file  Social History Narrative   Recently remarried   No children   Works in Therapist, art, [employer] products   Completed [place] up in [place]   Enjoys sleeping and shopping   Has a pitbull    Past Surgical History:  Procedure Laterality Date  . ACHILLES TENDON REPAIR Right 2010  . CERVICAL POLYPECTOMY N/A 05/19/2014   Procedure: CERVICAL POLYPECTOMY;  Surgeon: Sharene Butters, MD;  Location: [facility];  Service: Gynecology;  Laterality: N/A;  . HYSTEROSCOPY N/A 05/19/2014   Procedure: HYSTEROSCOPY;  Surgeon: Sharene Butters, MD;  Location: [facility];  Service: Gynecology;  Laterality: N/A;  . WISDOM TOOTH EXTRACTION  Family History  Problem Relation Age of Onset  . Hypertension Mother   . Glaucoma Father   . Blindness Father   . Hypertension Other   . Cancer Neg Hx   . Kidney disease Neg Hx   . Diabetes Neg Hx     No Known Allergies  Current Outpatient Medications on File Prior to Visit  Medication Sig Dispense Refill  . amLODipine (NORVASC) 10 MG tablet Take 1 tablet (10 mg total) by mouth daily. 90 tablet 1  . norethindrone-ethinyl estradiol-iron (MICROGESTIN FE 1.5/30) 1.5-30 MG-MCG tablet Take 1 tablet by mouth daily. 1 Package 11   No current facility-administered medications on file prior to visit.     BP 128/84 (BP Location: Left Arm, Patient Position: Sitting, Cuff Size: Large)   Pulse 82   Temp (!) 97.4 F (36.3 C) (Temporal)   Resp 16   Ht 5' 3.5" (1.613 m)   Wt 196 lb (88.9 kg)   SpO2 98%   BMI 34.18 kg/m    Objective:   Physical Exam Constitutional:      Appearance: She is well-developed.  Neck:     Musculoskeletal: Neck supple.      Thyroid: No thyromegaly.  Cardiovascular:     Rate and Rhythm: Normal rate and regular rhythm.     Heart sounds: Normal heart sounds. No murmur.  Pulmonary:     Effort: Pulmonary effort is normal. No respiratory distress.     Breath sounds: Normal breath sounds. No wheezing.  Skin:    General: Skin is warm and dry.     Comments: Hypertrophic patch of dry appearing skin noted on posterior neck beneath hairline about 1.5 inch wide  Neurological:     Mental Status: She is alert and oriented to person, place, and time.  Psychiatric:        Behavior: Behavior normal.        Thought Content: Thought content normal.        Judgment: Judgment normal.           Assessment & Plan:  Eczema- trial of diprolene cream bid to affected area.  HTN- bp stable, continue current dose of amlodipine.  Iron deficiency anemia- obtain follow up iron levels/cbc.  Reports that she is using an liquid iron supplement but not taking every day.

## 2019-07-09 MED FILL — LARIN FE 1.5-30 TABLET: 1.5-30 | 28 days supply | Qty: 28 | Fill #4

## 2019-07-23 MED FILL — AMLODIPINE BESYLATE 10 MG T: 10 | 30 days supply | Qty: 30 | Fill #3

## 2019-08-06 MED FILL — LARIN FE 1.5-30 TABLET: 1.5-30 | 28 days supply | Qty: 28 | Fill #5

## 2019-09-03 MED FILL — LARIN FE 1.5-30 TABLET: 1.5-30 | 28 days supply | Qty: 28 | Fill #6

## 2019-09-03 MED FILL — AMLODIPINE BESYLATE 10 MG T: 10 | 60 days supply | Qty: 60 | Fill #4

## 2019-10-01 MED FILL — LARIN FE 1.5-30 TABLET: 1.5-30 | 28 days supply | Qty: 28 | Fill #7

## 2019-10-27 MED FILL — LARIN FE 1.5-30 TABLET: 1.5-30 | 28 days supply | Qty: 28 | Fill #8

## 2019-11-29 MED FILL — LARIN FE 1.5-30 TABLET: 1.5-30 | 28 days supply | Qty: 28 | Fill #9

## 2019-12-07 ENCOUNTER — Ambulatory Visit: Payer: PRIVATE HEALTH INSURANCE | Admitting: Family

## 2019-12-07 ENCOUNTER — Other Ambulatory Visit: Payer: Self-pay

## 2019-12-07 ENCOUNTER — Encounter: Payer: Self-pay | Admitting: Family

## 2019-12-07 VITALS — BP 136/86 | HR 95 | Temp 97.2°F | Resp 16 | Ht 63.5 in | Wt 192.0 lb

## 2019-12-07 DIAGNOSIS — R21 Rash and other nonspecific skin eruption: Secondary | ICD-10-CM

## 2019-12-07 DIAGNOSIS — D509 Iron deficiency anemia, unspecified: Secondary | ICD-10-CM | POA: Diagnosis not present

## 2019-12-07 DIAGNOSIS — I10 Essential (primary) hypertension: Secondary | ICD-10-CM | POA: Diagnosis not present

## 2019-12-07 LAB — CBC WITH DIFFERENTIAL/PLATELET
Basophils Absolute: 0 10*3/uL (ref 0.0–0.1)
Basophils Relative: 0.9 % (ref 0.0–3.0)
Eosinophils Absolute: 0 10*3/uL (ref 0.0–0.7)
Eosinophils Relative: 0.8 % (ref 0.0–5.0)
HCT: 36.9 % (ref 36.0–46.0)
Hemoglobin: 12.6 g/dL (ref 12.0–15.0)
Lymphocytes Relative: 20.6 % (ref 12.0–46.0)
Lymphs Abs: 1 10*3/uL (ref 0.7–4.0)
MCHC: 34.2 g/dL (ref 30.0–36.0)
MCV: 92.6 fl (ref 78.0–100.0)
Monocytes Absolute: 0.4 10*3/uL (ref 0.1–1.0)
Monocytes Relative: 7.3 % (ref 3.0–12.0)
Neutro Abs: 3.4 10*3/uL (ref 1.4–7.7)
Neutrophils Relative %: 70.4 % (ref 43.0–77.0)
Platelets: 282 10*3/uL (ref 150.0–400.0)
RBC: 3.99 Mil/uL (ref 3.87–5.11)
RDW: 14.6 % (ref 11.5–15.5)
WBC: 4.9 10*3/uL (ref 4.0–10.5)

## 2019-12-07 LAB — IRON: Iron: 113 ug/dL (ref 42–145)

## 2019-12-07 LAB — BASIC METABOLIC PANEL
BUN: 11 mg/dL (ref 6–23)
CO2: 23 mEq/L (ref 19–32)
Calcium: 8.5 mg/dL (ref 8.4–10.5)
Chloride: 104 mEq/L (ref 96–112)
Creatinine, Ser: 0.86 mg/dL (ref 0.40–1.20)
GFR: 84.97 mL/min (ref >60.00)
Glucose, Bld: 87 mg/dL (ref 70–99)
Potassium: 3.9 mEq/L (ref 3.5–5.1)
Sodium: 137 mEq/L (ref 135–145)

## 2019-12-07 LAB — FERRITIN: Ferritin: 10.3 ng/mL (ref 10.0–291.0)

## 2019-12-07 NOTE — Patient Instructions (Signed)
Please complete lab work prior to leaving.   

## 2019-12-07 NOTE — Progress Notes (Signed)
Subjective:    Patient ID: Joanna Wright, female    DOB: [birth year], 49 y.o.   MRN: [id number]  HPI  Patient is a 49 yr old female who presents today for follow up.  Anemia- not on iron currently.  Lab Results  Component Value Date   WBC 4.9 06/09/2019   HGB 11.3 (L) 06/09/2019   HCT 34.4 (L) 06/09/2019   MCV 89.0 06/09/2019   PLT 400.0 06/09/2019   HTN- maintained on amlodipine. Reports occasional swelling in her feet. None today. BP Readings from Last 3 Encounters:  12/07/19 136/86  06/09/19 128/84  03/10/19 (!) 154/109   Skin rash- reports rash on neck flares occasionally and is improved with occasional use of betmethasone.   Review of Systems See HPI  Past Medical History:  Diagnosis Date  . DUB (dysfunctional uterine bleeding)    Hx  . MHA (microangiopathic hemolytic anemia) (HCC)    Hx  . Night sweats    Hx  . Psoriasis of scalp      Social History   Socioeconomic History  . Marital status: Married    Spouse name: Not on file  . Number of children: Not on file  . Years of education: Not on file  . Highest education level: Not on file  Occupational History  . Not on file  Tobacco Use  . Smoking status: Never Smoker  . Smokeless tobacco: Never Used  Substance and Sexual Activity  . Alcohol use: Yes    Alcohol/week: 0.0 standard drinks    Comment: 1 drink per month  . Drug use: No  . Sexual activity: Yes    Birth control/protection: Condom  Other Topics Concern  . 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 Difficulty of Paying Living Expenses:   Food Insecurity:   . Worried About Charity fundraiser in the Last Year:   . Arboriculturist in the Last Year:   Transportation Needs:   . Film/video editor  (Medical):   Marland Kitchen Lack of Transportation (Non-Medical):   Physical Activity:   . Days of Exercise per Week:   . Minutes of Exercise per Session:   Stress:   . Feeling of Stress :   Social Connections:   . Frequency of Communication with Friends and Family:   . Frequency of Social Gatherings with Friends and Family:   . Attends Religious Services:   . Active Member of Clubs or Organizations:   . Attends Archivist Meetings:   Marland Kitchen Marital Status:   Intimate Partner Violence:   . Fear of Current or Ex-Partner:   . Emotionally Abused:   Marland Kitchen Physically Abused:   . Sexually Abused:     Past Surgical History:  Procedure Laterality Date  . ACHILLES TENDON REPAIR Right 2010  . CERVICAL POLYPECTOMY N/A 05/19/2014   Procedure: CERVICAL POLYPECTOMY;  Surgeon: Sharene Butters, MD;  Location: [facility];  Service: Gynecology;  Laterality: N/A;  . HYSTEROSCOPY N/A 05/19/2014   Procedure: HYSTEROSCOPY;  Surgeon: Sharene Butters, MD;  Location: [facility];  Service: Gynecology;  Laterality: N/A;  . WISDOM TOOTH EXTRACTION      Family History  Problem Relation Age of  Onset  . Hypertension Mother   . Glaucoma Father   . Blindness Father   . Hypertension Other   . Cancer Neg Hx   . Kidney disease Neg Hx   . Diabetes Neg Hx     No Known Allergies  Current Outpatient Medications on File Prior to Visit  Medication Sig Dispense Refill  . amLODipine (NORVASC) 10 MG tablet Take 1 tablet (10 mg total) by mouth daily. 90 tablet 1  . betamethasone valerate ointment (VALISONE) 0.1 % Apply 1 application topically 2 (two) times daily. 30 g 0  . norethindrone-ethinyl estradiol-iron (MICROGESTIN FE 1.5/30) 1.5-30 MG-MCG tablet Take 1 tablet by mouth daily. 1 Package 11   No current facility-administered medications on file prior to visit.    BP 136/86 (BP Location: Right Arm, Patient Position: Sitting, Cuff Size: Normal)   Pulse 95   Temp (!) 97.2 F (36.2 C) (Temporal)   Resp 16   Ht 5' 3.5"  (1.613 m)   Wt 192 lb (87.1 kg)   SpO2 100%   BMI 33.48 kg/m       Objective:   Physical Exam Constitutional:      Appearance: She is well-developed.  Cardiovascular:     Rate and Rhythm: Normal rate and regular rhythm.     Heart sounds: Normal heart sounds. No murmur.  Pulmonary:     Effort: Pulmonary effort is normal. No respiratory distress.     Breath sounds: Normal breath sounds. No wheezing.  Musculoskeletal:        General: No swelling.  Skin:    Comments: No neck rash noted  Psychiatric:        Behavior: Behavior normal.        Thought Content: Thought content normal.        Judgment: Judgment normal.           Assessment & Plan:  HTN- bp stable on current dose of amlodipine. Continue same. Obtain follow up bmet.  Skin rash- stable with prn use of betamethasone.  Anemia- not currently taking iron supplement. Will obtain follow up cbc, iron levels.   This visit occurred during the SARS-CoV-2 public health emergency.  Safety protocols were in place, including screening questions prior to the visit, additional usage of staff PPE, and extensive cleaning of exam room while observing appropriate contact time as indicated for disinfecting solutions.

## 2019-12-23 MED FILL — LARIN FE 1.5-30 TABLET: 1.5-30 | 28 days supply | Qty: 28 | Fill #10

## 2020-01-04 ENCOUNTER — Other Ambulatory Visit: Payer: Self-pay | Admitting: Family

## 2020-01-04 MED FILL — AMLODIPINE BESYLATE 10 MG T: 10 | 60 days supply | Qty: 60 | Fill #0

## 2020-01-24 MED FILL — LARIN FE 1.5-30 TABLET: 1.5-30 | 28 days supply | Qty: 28 | Fill #11

## 2020-02-22 ENCOUNTER — Other Ambulatory Visit: Payer: Self-pay | Admitting: Family

## 2020-02-22 MED FILL — LARIN FE 1.5-30 TABLET: 1.5-30 | 28 days supply | Qty: 28 | Fill #0

## 2020-03-17 MED FILL — LARIN FE 1.5-30 TABLET: 1.5-30 | 28 days supply | Qty: 28 | Fill #1

## 2020-03-21 ENCOUNTER — Other Ambulatory Visit: Payer: Self-pay

## 2020-03-21 ENCOUNTER — Ambulatory Visit (INDEPENDENT_AMBULATORY_CARE_PROVIDER_SITE_OTHER): Payer: PRIVATE HEALTH INSURANCE | Admitting: Family

## 2020-03-21 ENCOUNTER — Encounter: Payer: Self-pay | Admitting: Family

## 2020-03-21 VITALS — BP 142/90 | HR 89 | Resp 16 | Ht 63.5 in | Wt 188.0 lb

## 2020-03-21 DIAGNOSIS — Z0000 Encounter for general adult medical examination without abnormal findings: Secondary | ICD-10-CM

## 2020-03-21 LAB — LIPID PANEL
Cholesterol: 220 mg/dL — ABNORMAL HIGH (ref 0–200)
HDL: 81 mg/dL (ref >39.00)
LDL Cholesterol: 129 mg/dL — ABNORMAL HIGH (ref 0–99)
NonHDL: 139.24
Total CHOL/HDL Ratio: 3
Triglycerides: 49 mg/dL (ref 0.0–149.0)
VLDL: 9.8 mg/dL (ref 0.0–40.0)

## 2020-03-21 LAB — HEPATIC FUNCTION PANEL
ALT: 9 U/L (ref 0–35)
AST: 11 U/L (ref 0–37)
Albumin: 4.2 g/dL (ref 3.5–5.2)
Alkaline Phosphatase: 49 U/L (ref 39–117)
Bilirubin, Direct: 0.1 mg/dL (ref 0.0–0.3)
Total Bilirubin: 0.4 mg/dL (ref 0.2–1.2)
Total Protein: 7.1 g/dL (ref 6.0–8.3)

## 2020-03-21 LAB — TSH: TSH: 1.68 u[IU]/mL (ref 0.35–4.50)

## 2020-03-21 MED ORDER — MULTI-VITAMIN/MINERALS PO TABS: 1.0000 | ORAL_TABLET | Freq: Every day | ORAL | Status: DC

## 2020-03-21 NOTE — Progress Notes (Signed)
Subjective:    Patient ID: Joanna Wright, female    DOB: 1971/06/09, 49 y.o.   MRN: [id number]  HPI   Patient presents today for complete physical.  Immunizations: tdap 2017,  Diet: more salads, smoothies, veggies Exercise: regular walking.  Pap Smear: 03/10/2019 Mammogram: 03/23/19 Vision: up to date Dental: up to date  HTN- maintained on amlodipine 10mg .  BP Readings from Last 3 Encounters:  03/21/20 (!) 142/90  12/07/19 136/86  06/09/19 128/84   Wt Readings from Last 3 Encounters:  03/21/20 188 lb (85.3 kg)  12/07/19 192 lb (87.1 kg)  06/09/19 196 lb (88.9 kg)         Review of Systems  Constitutional: Negative for unexpected weight change.  HENT: Negative for hearing loss and rhinorrhea.   Eyes: Negative for visual disturbance.  Respiratory: Negative for cough and shortness of breath.   Cardiovascular: Positive for chest pain (some burning pain with burping in chest. ).  Gastrointestinal: Negative for constipation and diarrhea.  Genitourinary: Negative for dysuria and frequency.  Musculoskeletal: Negative for arthralgias and myalgias.  Skin: Negative for rash.  Neurological: Negative for headaches.  Hematological: Negative for adenopathy.  Psychiatric/Behavioral:       Denies depression/anxiety       Past Medical History:  Diagnosis Date  . DUB (dysfunctional uterine bleeding)    Hx  . MHA (microangiopathic hemolytic anemia) (HCC)    Hx  . Night sweats    Hx  . Psoriasis of scalp      Social History   Socioeconomic History  . Marital status: Married    Spouse name: Not on file  . Number of children: Not on file  . Years of education: Not on file  . Highest education level: Not on file  Occupational History  . Not on file  Tobacco Use  . Smoking status: Never Smoker  . Smokeless tobacco: Never Used  Substance and Sexual Activity  . Alcohol use: Yes    Alcohol/week: 0.0 standard drinks    Comment: 1 drink per month  . Drug use: No  .  Sexual activity: Yes    Birth control/protection: Condom  Other Topics Concern  . 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 Difficulty of Paying Living Expenses:   Food Insecurity:   . Worried About Charity fundraiser in the Last Year:   . Arboriculturist in the Last Year:   Transportation Needs:   . Film/video editor (Medical):   Marland Kitchen Lack of Transportation (Non-Medical):   Physical Activity:   . Days of Exercise per Week:   . Minutes of Exercise per Session:   Stress:   . Feeling of Stress :   Social Connections:   . Frequency of Communication with Friends and Family:   . Frequency of Social Gatherings with Friends and Family:   . Attends Religious Services:   . Active Member of Clubs or Organizations:   . Attends Archivist Meetings:   Marland Kitchen Marital Status:   Intimate Partner Violence:   . Fear of Current or Ex-Partner:   . Emotionally Abused:   Marland Kitchen Physically Abused:   . Sexually Abused:  Past Surgical History:  Procedure Laterality Date  . ACHILLES TENDON REPAIR Right 2010  . CERVICAL POLYPECTOMY N/A 05/19/2014   Procedure: CERVICAL POLYPECTOMY;  Surgeon: Sharene Butters, MD;  Location: [facility];  Service: Gynecology;  Laterality: N/A;  . HYSTEROSCOPY N/A 05/19/2014   Procedure: HYSTEROSCOPY;  Surgeon: Sharene Butters, MD;  Location: [facility];  Service: Gynecology;  Laterality: N/A;  . WISDOM TOOTH EXTRACTION      Family History  Problem Relation Age of Onset  . Hypertension Mother   . Glaucoma Father   . Blindness Father   . Hypertension Other   . Cancer Neg Hx   . Kidney disease Neg Hx   . Diabetes Neg Hx     No Known Allergies  Current Outpatient Medications on File Prior to Visit  Medication Sig Dispense Refill    . amLODipine (NORVASC) 10 MG tablet TAKE 1 TABLET (10 MG TOTAL) BY MOUTH DAILY. 60 tablet 1  . betamethasone valerate ointment (VALISONE) 0.1 % Apply 1 application topically 2 (two) times daily. 30 g 0  . LARIN FE 1.5/30 1.5-30 MG-MCG tablet TAKE 1 TABLET BY MOUTH DAILY. 28 tablet 5   No current facility-administered medications on file prior to visit.    BP (!) 142/90 (BP Location: Right Arm, Patient Position: Sitting, Cuff Size: Large)   Pulse 89   Resp 16   Ht 5' 3.5" (1.613 m)   Wt 188 lb (85.3 kg)   LMP 03/16/2020 (Exact Date)   SpO2 97%   BMI 32.78 kg/m    Objective:   Physical Exam Physical Exam  Constitutional: She is oriented to person, place, and time. She appears well-developed and well-nourished. No distress.  HENT:  Head: Normocephalic and atraumatic.  Right Ear: Tympanic membrane and ear canal normal.  Left Ear: Tympanic membrane and ear canal normal.  Mouth/Throat:Not examined pt wearing mask Eyes: Pupils are equal, round, and reactive to light. No scleral icterus.  Neck: Normal range of motion. No thyromegaly present.  Cardiovascular: Normal rate and regular rhythm.   No murmur heard. Pulmonary/Chest: Effort normal and breath sounds normal. No respiratory distress. He has no wheezes. She has no rales. She exhibits no tenderness.  Abdominal: Soft. Bowel sounds are normal. She exhibits no distension and no mass. There is no tenderness. There is no rebound and no guarding.  Musculoskeletal: She exhibits no edema.  Lymphadenopathy:    She has no cervical adenopathy.  Neurological: She is alert and oriented to person, place, and time. She has normal patellar reflexes. She exhibits normal muscle tone. Coordination normal.  Skin: Skin is warm and dry.  Psychiatric: She has a normal mood and affect. Her behavior is normal. Judgment and thought content normal.  Breasts/pelvic: deferred  Preventative care-encouraged pt to continue her good work on [employer],  exercise and weight loss.  She will schedule mammogram on the first floor. Immunizations and pap up to date.  This visit occurred during the SARS-CoV-2 public health emergency.  Safety protocols were in place, including screening questions prior to the visit, additional usage of staff PPE, and extensive cleaning of exam room while observing appropriate contact time as indicated for disinfecting solutions.          Assessment & Plan:          Assessment & Plan:  HTN- bp mildly elevated. We discussed adding a second agent. She reports she sometimes forgets her amlodipine. We compromised with the plan that she will check bp once daily  x 1 week at home and send me her readings. If still elevated, plan to add a second anthypertensive.

## 2020-03-21 NOTE — Patient Instructions (Signed)
Please complete lab work prior to leaving. Schedule mammogram on the first floor.

## 2020-04-05 ENCOUNTER — Ambulatory Visit (HOSPITAL_BASED_OUTPATIENT_CLINIC_OR_DEPARTMENT_OTHER): Payer: PRIVATE HEALTH INSURANCE

## 2020-04-11 ENCOUNTER — Ambulatory Visit (HOSPITAL_BASED_OUTPATIENT_CLINIC_OR_DEPARTMENT_OTHER)
Admission: RE | Admit: 2020-04-11 | Discharge: 2020-04-11 | Disposition: A | Discharge status: Home / Self Care | Payer: PRIVATE HEALTH INSURANCE | Source: Ambulatory Visit | Attending: Family | Admitting: Family

## 2020-04-11 ENCOUNTER — Other Ambulatory Visit: Payer: Self-pay

## 2020-04-11 ENCOUNTER — Encounter (HOSPITAL_BASED_OUTPATIENT_CLINIC_OR_DEPARTMENT_OTHER): Payer: Self-pay

## 2020-04-11 DIAGNOSIS — Z1231 Encounter for screening mammogram for malignant neoplasm of breast: Secondary | ICD-10-CM | POA: Insufficient documentation

## 2020-04-11 DIAGNOSIS — Z0000 Encounter for general adult medical examination without abnormal findings: Secondary | ICD-10-CM

## 2020-04-11 MED FILL — LARIN FE 1.5-30 TABLET: 1.5-30 | 28 days supply | Qty: 28 | Fill #2

## 2020-04-13 ENCOUNTER — Other Ambulatory Visit: Payer: Self-pay | Admitting: Family

## 2020-04-13 ENCOUNTER — Telehealth: Payer: Self-pay | Admitting: Family

## 2020-04-13 DIAGNOSIS — R928 Other abnormal and inconclusive findings on diagnostic imaging of breast: Secondary | ICD-10-CM

## 2020-04-13 NOTE — Telephone Encounter (Signed)
Please let pt know that the radiologist would like her to complete some additional breast images for further evaluation. Let me know if she has not been contacted by them about a follow up appointment in 1 week.   

## 2020-04-13 NOTE — Telephone Encounter (Signed)
Called patient, she reports she has been contacted by radiology and is scheduled.

## 2020-04-24 ENCOUNTER — Other Ambulatory Visit: Payer: Self-pay

## 2020-04-24 ENCOUNTER — Ambulatory Visit
Admission: RE | Admit: 2020-04-24 | Discharge: 2020-04-24 | Disposition: A | Discharge status: Home / Self Care | Payer: PRIVATE HEALTH INSURANCE | Source: Ambulatory Visit | Attending: Family | Admitting: Family

## 2020-04-24 ENCOUNTER — Ambulatory Visit: Payer: PRIVATE HEALTH INSURANCE

## 2020-04-24 DIAGNOSIS — R928 Other abnormal and inconclusive findings on diagnostic imaging of breast: Secondary | ICD-10-CM

## 2020-05-16 MED FILL — LARIN FE 1.5-30 TABLET: 1.5-30 | 28 days supply | Qty: 28 | Fill #3

## 2020-06-02 MED FILL — AMLODIPINE BESYLATE 10 MG T: 10 | 60 days supply | Qty: 60 | Fill #1

## 2020-06-09 MED FILL — LARIN FE 1.5-30 TABLET: 1.5-30 | 28 days supply | Qty: 28 | Fill #4

## 2020-06-15 ENCOUNTER — Other Ambulatory Visit (HOSPITAL_BASED_OUTPATIENT_CLINIC_OR_DEPARTMENT_OTHER): Payer: Self-pay | Admitting: Family

## 2020-07-11 MED FILL — LARIN FE 1.5-30 TABLET: 1.5-30 | 28 days supply | Qty: 28 | Fill #5

## 2020-08-04 MED FILL — LARIN FE 1.5-30 TABLET: 1.5-30 | 21 days supply | Qty: 28 | Fill #0

## 2020-09-01 MED FILL — LARIN FE 1.5-30 TABLET: 1.5-30 | 21 days supply | Qty: 28 | Fill #1

## 2020-09-24 IMAGING — MG MM DIGITAL DIAGNOSTIC UNILAT*L* W/ TOMO W/ CAD
6 series · 6 of 18 positions shown · non-contrast
Comparison: Previous exam(s).

CLINICAL DATA: 49-year-old female for further evaluation of
possible LEFT breast masses on screening mammogram.

EXAM:
DIGITAL DIAGNOSTIC UNILATERAL LEFT MAMMOGRAM WITH TOMO AND CAD

[L ML synth-2D]
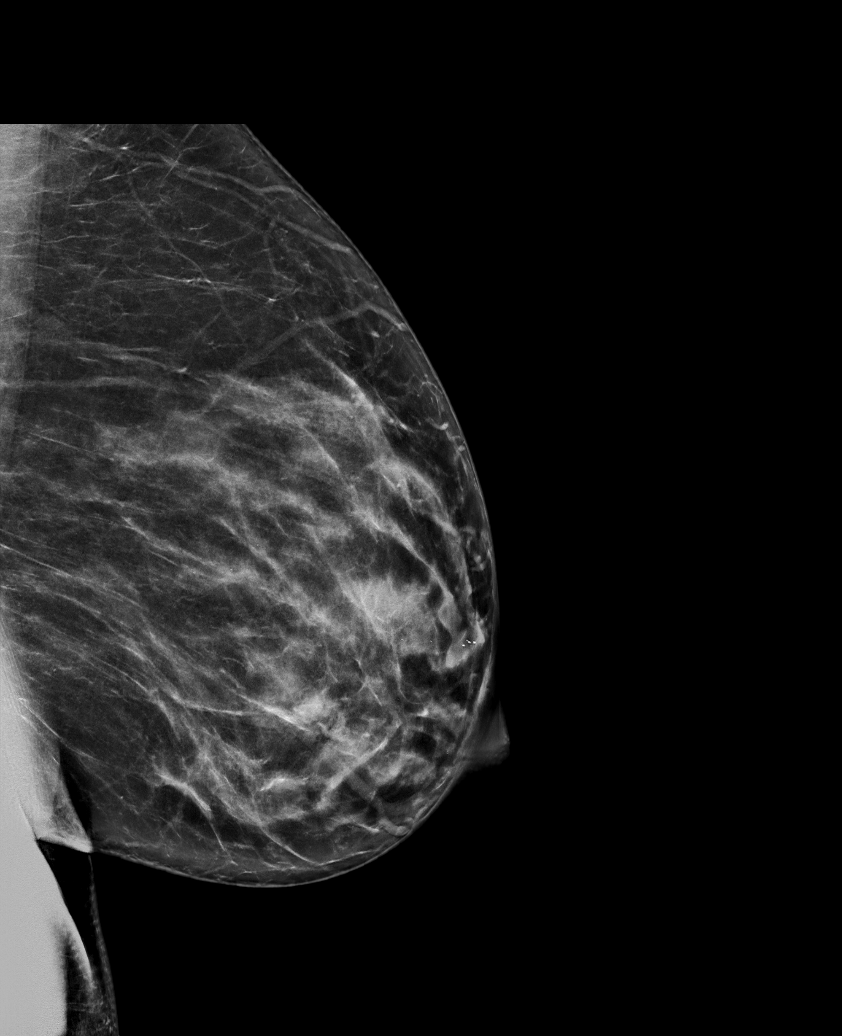

[L CC synth-2D (1 of 2)]
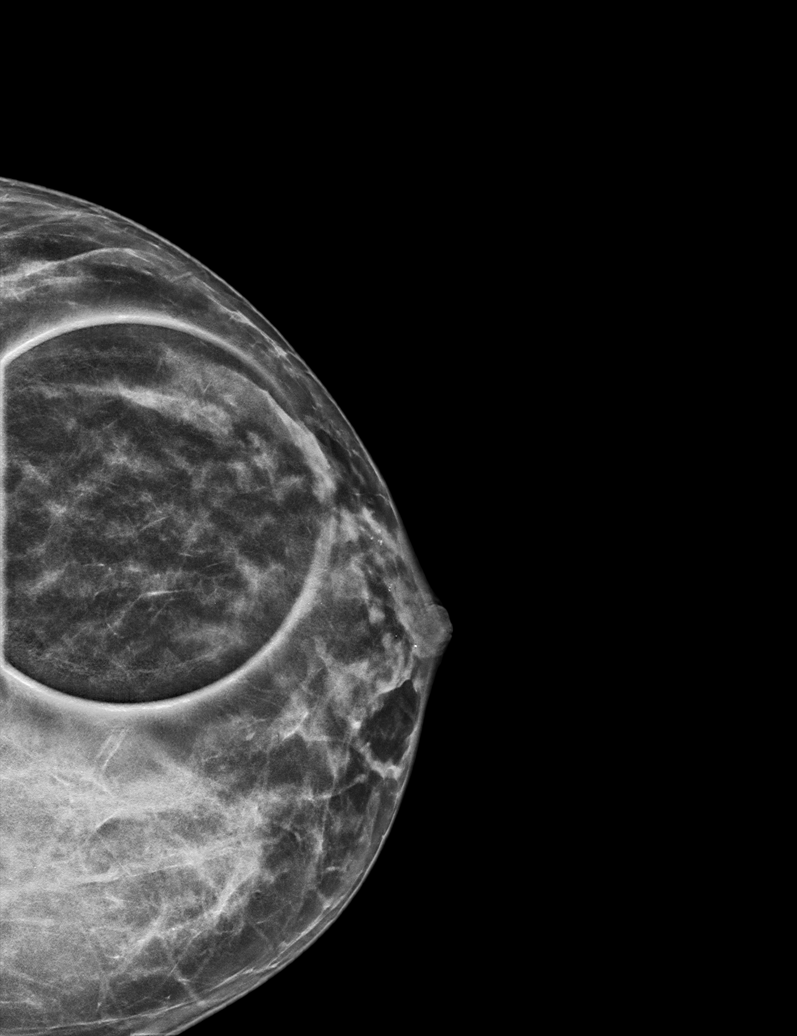

[L CC synth-2D (2 of 2)]
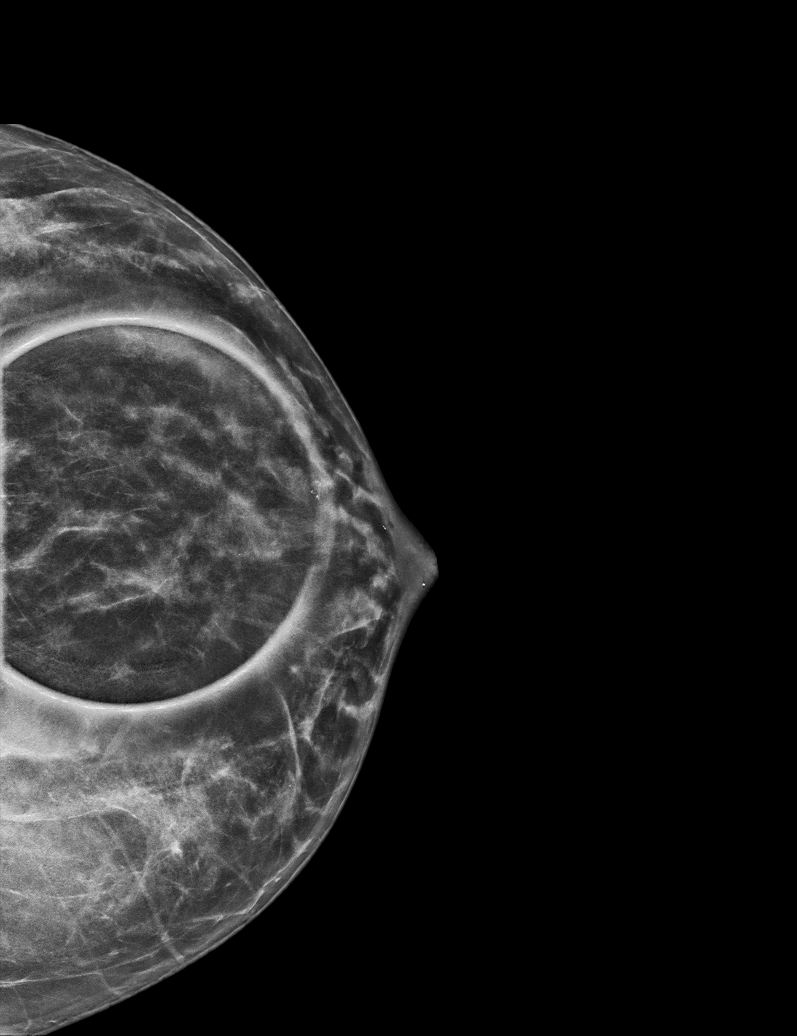

[L ML tomo · tomo slice 41/82.0]
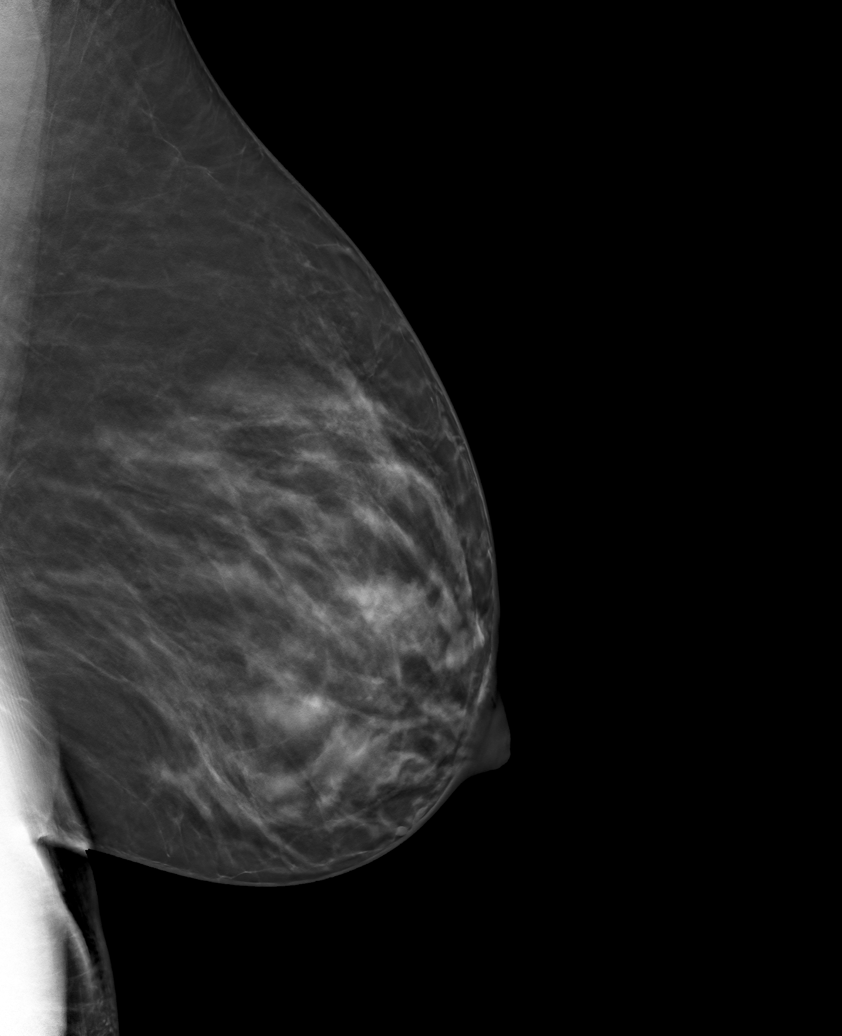

[L CC tomo (1 of 2) · tomo slice 29/58.0]
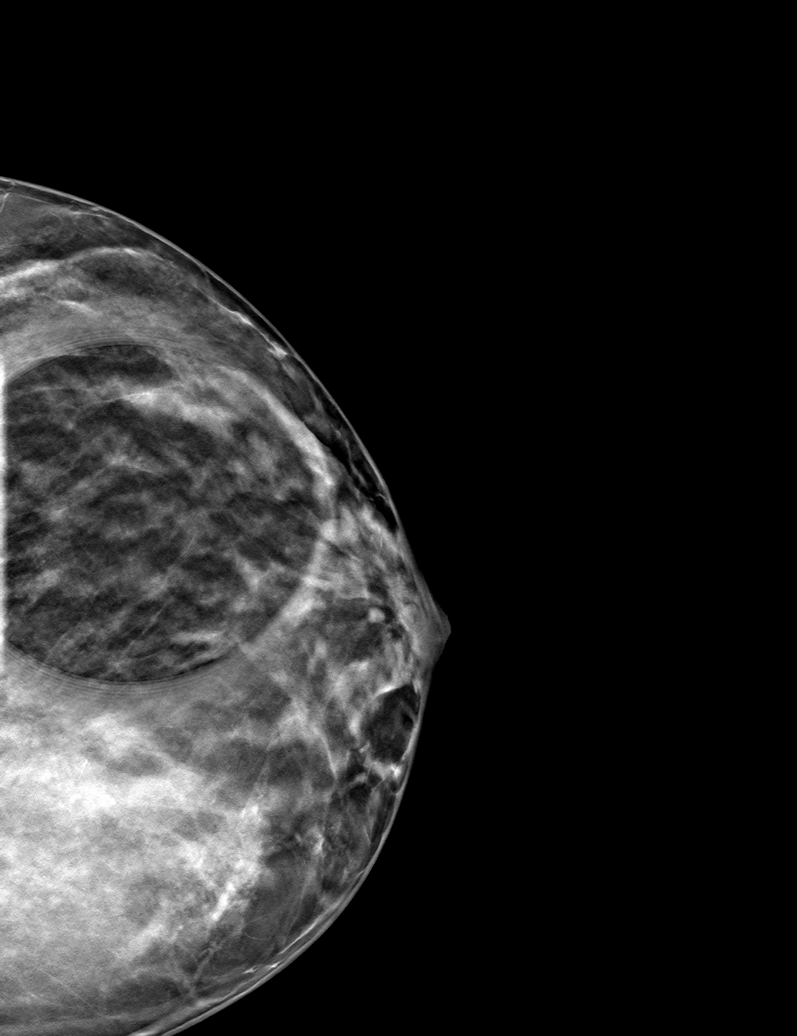

[L CC tomo (2 of 2) · tomo slice 29/56.0]
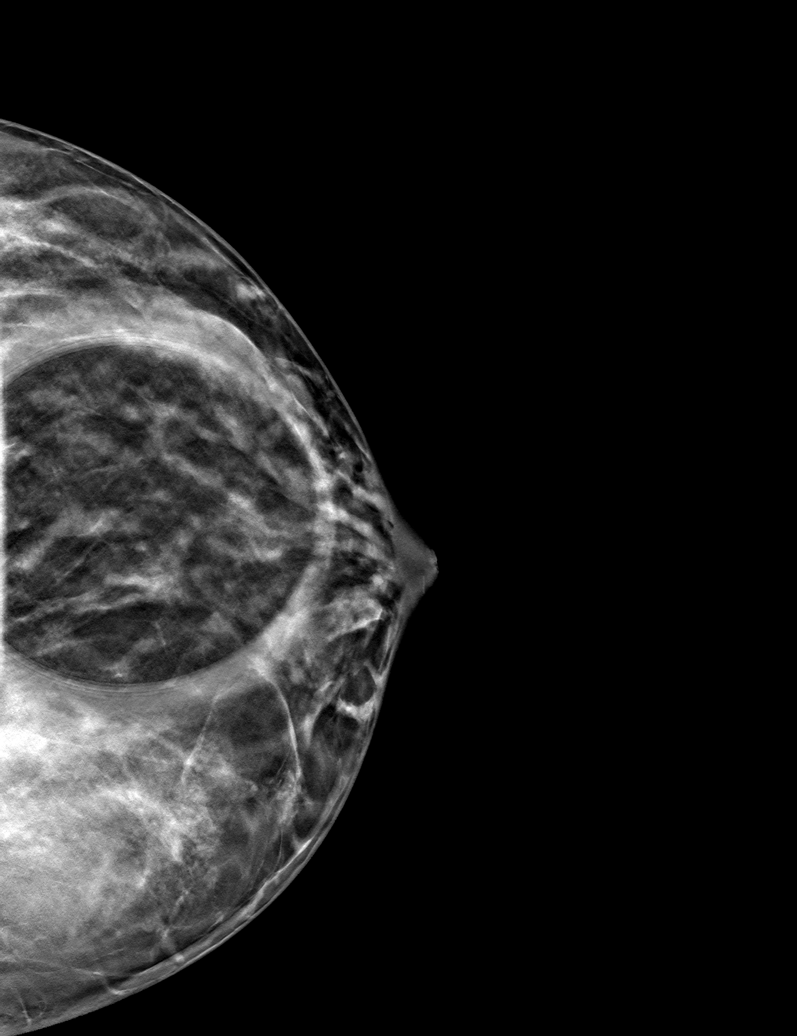

[6 of 18 positions shown; findings below may reference images not displayed]

ACR Breast Density Category c: The breast tissue is heterogeneously
dense, which may obscure small masses.
FINDINGS: 2D/3D full field and spot compression views of the LEFT breast
demonstrate no persistent abnormalities in the areas of the
screening study findings. On today's study, these areas have a
similar appearance to remote studies.

Mammographic images were processed with CAD.
IMPRESSION: No persistent abnormalities in the area of the screening study
findings.

RECOMMENDATION:
Bilateral screening mammogram in 1 year.

I have discussed the findings and recommendations with the patient.
If applicable, a reminder letter will be sent to the patient
regarding the next appointment.

BI-RADS CATEGORY  1: Negative.

## 2020-10-02 MED FILL — LARIN FE 1.5-30 TABLET: 1.5-30 | 21 days supply | Qty: 28 | Fill #2

## 2020-10-30 ENCOUNTER — Other Ambulatory Visit: Payer: Self-pay | Admitting: Family

## 2020-10-30 MED FILL — AMLODIPINE BESYLATE 10 MG T: 10 | 30 days supply | Qty: 30 | Fill #0

## 2020-10-30 MED FILL — LARIN FE 1.5-30 TABLET: 1.5-30 | 21 days supply | Qty: 28 | Fill #3

## 2020-11-27 MED FILL — LARIN FE 1.5-30 TABLET: 1.5-30 | 21 days supply | Qty: 28 | Fill #4

## 2020-12-25 ENCOUNTER — Other Ambulatory Visit (HOSPITAL_BASED_OUTPATIENT_CLINIC_OR_DEPARTMENT_OTHER): Payer: Self-pay

## 2020-12-25 ENCOUNTER — Other Ambulatory Visit: Payer: Self-pay | Admitting: Family

## 2020-12-25 MED ORDER — NORETHIN ACE-ETH ESTRAD-FE 1.5-30 MG-MCG PO TABS
1.0000 | ORAL_TABLET | Freq: Every day | ORAL | 0 refills | Status: DC
  Filled 2020-12-25: qty 28, 28d supply, fill #0

## 2020-12-25 MED FILL — Norethindrone Ace & Ethinyl Estradiol-FE Tab 1.5 MG-30 MCG: ORAL | 21 days supply | Qty: 28 | Fill #0 | Status: CN

## 2021-01-02 ENCOUNTER — Inpatient Hospital Stay (HOSPITAL_COMMUNITY)
Admission: EM | Admit: 2021-01-02 | Discharge: 2021-01-11 | DRG: 493 | Disposition: A | Discharge status: Rehabilitation Facility | Payer: PRIVATE HEALTH INSURANCE | Attending: General Surgery | Admitting: General Surgery

## 2021-01-02 ENCOUNTER — Emergency Department (HOSPITAL_COMMUNITY): Payer: PRIVATE HEALTH INSURANCE

## 2021-01-02 DIAGNOSIS — R52 Pain, unspecified: Secondary | ICD-10-CM

## 2021-01-02 DIAGNOSIS — I10 Essential (primary) hypertension: Secondary | ICD-10-CM | POA: Diagnosis present

## 2021-01-02 DIAGNOSIS — D594 Other nonautoimmune hemolytic anemias: Secondary | ICD-10-CM | POA: Diagnosis present

## 2021-01-02 DIAGNOSIS — S82409A Unspecified fracture of shaft of unspecified fibula, initial encounter for closed fracture: Secondary | ICD-10-CM | POA: Diagnosis present

## 2021-01-02 DIAGNOSIS — S62310A Displaced fracture of base of second metacarpal bone, right hand, initial encounter for closed fracture: Secondary | ICD-10-CM

## 2021-01-02 DIAGNOSIS — S82872A Displaced pilon fracture of left tibia, initial encounter for closed fracture: Principal | ICD-10-CM

## 2021-01-02 DIAGNOSIS — S52571A Other intraarticular fracture of lower end of right radius, initial encounter for closed fracture: Secondary | ICD-10-CM | POA: Diagnosis present

## 2021-01-02 DIAGNOSIS — S82209A Unspecified fracture of shaft of unspecified tibia, initial encounter for closed fracture: Secondary | ICD-10-CM | POA: Diagnosis present

## 2021-01-02 DIAGNOSIS — Z23 Encounter for immunization: Secondary | ICD-10-CM

## 2021-01-02 DIAGNOSIS — T148XXA Other injury of unspecified body region, initial encounter: Secondary | ICD-10-CM

## 2021-01-02 DIAGNOSIS — S82422A Displaced transverse fracture of shaft of left fibula, initial encounter for closed fracture: Secondary | ICD-10-CM | POA: Diagnosis present

## 2021-01-02 DIAGNOSIS — E559 Vitamin D deficiency, unspecified: Secondary | ICD-10-CM | POA: Diagnosis present

## 2021-01-02 DIAGNOSIS — Z79899 Other long term (current) drug therapy: Secondary | ICD-10-CM

## 2021-01-02 DIAGNOSIS — S52501A Unspecified fracture of the lower end of right radius, initial encounter for closed fracture: Secondary | ICD-10-CM

## 2021-01-02 DIAGNOSIS — Z419 Encounter for procedure for purposes other than remedying health state, unspecified: Secondary | ICD-10-CM

## 2021-01-02 DIAGNOSIS — Z20822 Contact with and (suspected) exposure to covid-19: Secondary | ICD-10-CM | POA: Diagnosis present

## 2021-01-02 DIAGNOSIS — S82892A Other fracture of left lower leg, initial encounter for closed fracture: Secondary | ICD-10-CM

## 2021-01-02 DIAGNOSIS — Y92413 State road as the place of occurrence of the external cause: Secondary | ICD-10-CM

## 2021-01-02 DIAGNOSIS — S62390A Other fracture of second metacarpal bone, right hand, initial encounter for closed fracture: Secondary | ICD-10-CM | POA: Diagnosis present

## 2021-01-02 DIAGNOSIS — D62 Acute posthemorrhagic anemia: Secondary | ICD-10-CM | POA: Diagnosis present

## 2021-01-02 DIAGNOSIS — K5900 Constipation, unspecified: Secondary | ICD-10-CM | POA: Diagnosis present

## 2021-01-02 DIAGNOSIS — S62101A Fracture of unspecified carpal bone, right wrist, initial encounter for closed fracture: Secondary | ICD-10-CM

## 2021-01-02 DIAGNOSIS — S52611A Displaced fracture of right ulna styloid process, initial encounter for closed fracture: Secondary | ICD-10-CM | POA: Diagnosis present

## 2021-01-02 LAB — CBC WITH DIFFERENTIAL/PLATELET
Abs Immature Granulocytes: 0.04 10*3/uL (ref 0.00–0.07)
Basophils Absolute: 0 10*3/uL (ref 0.0–0.1)
Basophils Relative: 0 %
Eosinophils Absolute: 0.1 10*3/uL (ref 0.0–0.5)
Eosinophils Relative: 1 %
HCT: 35.8 % — ABNORMAL LOW (ref 36.0–46.0)
Hemoglobin: 12.2 g/dL (ref 12.0–15.0)
Immature Granulocytes: 1 %
Lymphocytes Relative: 29 %
Lymphs Abs: 1.8 10*3/uL (ref 0.7–4.0)
MCH: 31.9 pg (ref 26.0–34.0)
MCHC: 34.1 g/dL (ref 30.0–36.0)
MCV: 93.7 fL (ref 80.0–100.0)
Monocytes Absolute: 0.6 10*3/uL (ref 0.1–1.0)
Monocytes Relative: 9 %
Neutro Abs: 3.8 10*3/uL (ref 1.7–7.7)
Neutrophils Relative %: 60 %
Platelets: 154 10*3/uL (ref 150–400)
RBC: 3.82 MIL/uL — ABNORMAL LOW (ref 3.87–5.11)
RDW: 12.9 % (ref 11.5–15.5)
WBC: 6.3 10*3/uL (ref 4.0–10.5)
nRBC: 0 % (ref 0.0–0.2)

## 2021-01-02 LAB — BASIC METABOLIC PANEL
Anion gap: 9 (ref 5–15)
BUN: 9 mg/dL (ref 6–20)
CO2: 22 mmol/L (ref 22–32)
Calcium: 8.2 mg/dL — ABNORMAL LOW (ref 8.9–10.3)
Chloride: 104 mmol/L (ref 98–111)
Creatinine, Ser: 0.71 mg/dL (ref 0.44–1.00)
GFR, Estimated: >60 mL/min (ref >60)
Glucose, Bld: 129 mg/dL — ABNORMAL HIGH (ref 70–99)
Potassium: 3.6 mmol/L (ref 3.5–5.1)
Sodium: 135 mmol/L (ref 135–145)

## 2021-01-02 LAB — TYPE AND SCREEN
ABO/RH(D): A POS
Antibody Screen: NEGATIVE

## 2021-01-02 LAB — I-STAT BETA HCG BLOOD, ED (MC, WL, AP ONLY): I-stat hCG, quantitative: <5 m[IU]/mL (ref <5)

## 2021-01-02 LAB — RESP PANEL BY RT-PCR (FLU A&B, COVID) ARPGX2
Influenza A by PCR: NEGATIVE
Influenza B by PCR: NEGATIVE
SARS Coronavirus 2 by RT PCR: NEGATIVE

## 2021-01-02 MED ORDER — MORPHINE SULFATE (PF) 4 MG/ML IV SOLN
4.0000 mg | Freq: Once | INTRAVENOUS | Status: AC
  Administered 2021-01-02: 4 mg via INTRAVENOUS
  Filled 2021-01-02: qty 1

## 2021-01-02 MED ORDER — SODIUM CHLORIDE 0.9 % IV BOLUS
1000.0000 mL | Freq: Once | INTRAVENOUS | Status: AC
  Administered 2021-01-02: 1000 mL via INTRAVENOUS

## 2021-01-02 MED ORDER — TETANUS-DIPHTH-ACELL PERTUSSIS 5-2.5-18.5 LF-MCG/0.5 IM SUSY
0.5000 mL | PREFILLED_SYRINGE | Freq: Once | INTRAMUSCULAR | Status: AC
  Administered 2021-01-02: 0.5 mL via INTRAMUSCULAR
  Filled 2021-01-02: qty 0.5

## 2021-01-02 MED ORDER — HYDROMORPHONE HCL 1 MG/ML IJ SOLN
0.5000 mg | Freq: Once | INTRAMUSCULAR | Status: AC
Start: 2021-01-02 — End: 2021-01-02
  Administered 2021-01-02: 0.5 mg via INTRAVENOUS
  Filled 2021-01-02: qty 1
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
## 2021-01-02 NOTE — Progress Notes (Signed)
Orthopedic Tech Progress Note Patient Details:  NIKIRA KUSHNIR 10-12-1970 [id number]  Ortho Devices Type of Ortho Device: Sugartong splint,Sling immobilizer,Post (short leg) splint,Stirrup splint,Finger trap Finger Trap Weight: 5 lbs Splint Material: Fiberglass Ortho Device/Splint Location: Right Upper Extremity/Left Lower Extremity Ortho Device/Splint Interventions: Ordered,Application   Post Interventions Patient Tolerated: Well Instructions Provided: Care of device,Poper ambulation with device   Amahri Dengel P Lorel Monaco 01/02/2021, 11:22 PM

## 2021-01-03 ENCOUNTER — Encounter (HOSPITAL_COMMUNITY): Payer: Self-pay

## 2021-01-03 ENCOUNTER — Inpatient Hospital Stay (HOSPITAL_COMMUNITY): Payer: PRIVATE HEALTH INSURANCE

## 2021-01-03 ENCOUNTER — Inpatient Hospital Stay (HOSPITAL_COMMUNITY): Payer: PRIVATE HEALTH INSURANCE | Admitting: Certified Registered Nurse Anesthetist

## 2021-01-03 ENCOUNTER — Emergency Department (HOSPITAL_COMMUNITY): Payer: PRIVATE HEALTH INSURANCE

## 2021-01-03 ENCOUNTER — Other Ambulatory Visit: Payer: Self-pay

## 2021-01-03 ENCOUNTER — Encounter (HOSPITAL_COMMUNITY): Admission: EM | Disposition: A | Discharge status: Rehabilitation Facility | Payer: Self-pay | Source: Home / Self Care

## 2021-01-03 DIAGNOSIS — S82302S Unspecified fracture of lower end of left tibia, sequela: Secondary | ICD-10-CM | POA: Diagnosis not present

## 2021-01-03 DIAGNOSIS — Z79899 Other long term (current) drug therapy: Secondary | ICD-10-CM | POA: Diagnosis not present

## 2021-01-03 DIAGNOSIS — S52501A Unspecified fracture of the lower end of right radius, initial encounter for closed fracture: Secondary | ICD-10-CM

## 2021-01-03 DIAGNOSIS — S82872A Displaced pilon fracture of left tibia, initial encounter for closed fracture: Secondary | ICD-10-CM

## 2021-01-03 DIAGNOSIS — Z23 Encounter for immunization: Secondary | ICD-10-CM | POA: Diagnosis not present

## 2021-01-03 DIAGNOSIS — M7989 Other specified soft tissue disorders: Secondary | ICD-10-CM | POA: Diagnosis not present

## 2021-01-03 DIAGNOSIS — S82875S Nondisplaced pilon fracture of left tibia, sequela: Secondary | ICD-10-CM | POA: Diagnosis not present

## 2021-01-03 DIAGNOSIS — G8918 Other acute postprocedural pain: Secondary | ICD-10-CM | POA: Diagnosis not present

## 2021-01-03 DIAGNOSIS — Y92413 State road as the place of occurrence of the external cause: Secondary | ICD-10-CM | POA: Diagnosis not present

## 2021-01-03 DIAGNOSIS — K5900 Constipation, unspecified: Secondary | ICD-10-CM | POA: Diagnosis present

## 2021-01-03 DIAGNOSIS — S82409S Unspecified fracture of shaft of unspecified fibula, sequela: Secondary | ICD-10-CM | POA: Diagnosis not present

## 2021-01-03 DIAGNOSIS — R0789 Other chest pain: Secondary | ICD-10-CM | POA: Diagnosis not present

## 2021-01-03 DIAGNOSIS — S62310A Displaced fracture of base of second metacarpal bone, right hand, initial encounter for closed fracture: Secondary | ICD-10-CM

## 2021-01-03 DIAGNOSIS — K5901 Slow transit constipation: Secondary | ICD-10-CM | POA: Diagnosis not present

## 2021-01-03 DIAGNOSIS — S52611A Displaced fracture of right ulna styloid process, initial encounter for closed fracture: Secondary | ICD-10-CM | POA: Diagnosis present

## 2021-01-03 DIAGNOSIS — S82872D Displaced pilon fracture of left tibia, subsequent encounter for closed fracture with routine healing: Secondary | ICD-10-CM | POA: Diagnosis not present

## 2021-01-03 DIAGNOSIS — D594 Other nonautoimmune hemolytic anemias: Secondary | ICD-10-CM | POA: Diagnosis present

## 2021-01-03 DIAGNOSIS — S82875D Nondisplaced pilon fracture of left tibia, subsequent encounter for closed fracture with routine healing: Secondary | ICD-10-CM | POA: Diagnosis not present

## 2021-01-03 DIAGNOSIS — I10 Essential (primary) hypertension: Secondary | ICD-10-CM | POA: Diagnosis present

## 2021-01-03 DIAGNOSIS — S82209S Unspecified fracture of shaft of unspecified tibia, sequela: Secondary | ICD-10-CM | POA: Diagnosis not present

## 2021-01-03 DIAGNOSIS — E559 Vitamin D deficiency, unspecified: Secondary | ICD-10-CM | POA: Diagnosis present

## 2021-01-03 DIAGNOSIS — S52571A Other intraarticular fracture of lower end of right radius, initial encounter for closed fracture: Secondary | ICD-10-CM | POA: Diagnosis present

## 2021-01-03 DIAGNOSIS — D75839 Thrombocytosis, unspecified: Secondary | ICD-10-CM | POA: Diagnosis not present

## 2021-01-03 DIAGNOSIS — R52 Pain, unspecified: Secondary | ICD-10-CM | POA: Diagnosis present

## 2021-01-03 DIAGNOSIS — S82422A Displaced transverse fracture of shaft of left fibula, initial encounter for closed fracture: Secondary | ICD-10-CM | POA: Diagnosis present

## 2021-01-03 DIAGNOSIS — S62390A Other fracture of second metacarpal bone, right hand, initial encounter for closed fracture: Secondary | ICD-10-CM | POA: Diagnosis present

## 2021-01-03 DIAGNOSIS — S82209A Unspecified fracture of shaft of unspecified tibia, initial encounter for closed fracture: Secondary | ICD-10-CM | POA: Diagnosis present

## 2021-01-03 DIAGNOSIS — S82409A Unspecified fracture of shaft of unspecified fibula, initial encounter for closed fracture: Secondary | ICD-10-CM | POA: Diagnosis present

## 2021-01-03 DIAGNOSIS — D62 Acute posthemorrhagic anemia: Secondary | ICD-10-CM | POA: Diagnosis present

## 2021-01-03 DIAGNOSIS — Z20822 Contact with and (suspected) exposure to covid-19: Secondary | ICD-10-CM | POA: Diagnosis present

## 2021-01-03 HISTORY — PX: EXTERNAL FIXATION LEG: SHX1549

## 2021-01-03 HISTORY — DX: Displaced pilon fracture of left tibia, initial encounter for closed fracture: S82.872A

## 2021-01-03 LAB — HIV ANTIBODY (ROUTINE TESTING W REFLEX): HIV Screen 4th Generation wRfx: NONREACTIVE

## 2021-01-03 LAB — CBC
HCT: 34.1 % — ABNORMAL LOW (ref 36.0–46.0)
Hemoglobin: 11.6 g/dL — ABNORMAL LOW (ref 12.0–15.0)
MCH: 32 pg (ref 26.0–34.0)
MCHC: 34 g/dL (ref 30.0–36.0)
MCV: 94.2 fL (ref 80.0–100.0)
Platelets: 277 10*3/uL (ref 150–400)
RBC: 3.62 MIL/uL — ABNORMAL LOW (ref 3.87–5.11)
RDW: 12.5 % (ref 11.5–15.5)
WBC: 9.3 10*3/uL (ref 4.0–10.5)
nRBC: 0 % (ref 0.0–0.2)

## 2021-01-03 LAB — VITAMIN D 25 HYDROXY (VIT D DEFICIENCY, FRACTURES): Vit D, 25-Hydroxy: 10.92 ng/mL — ABNORMAL LOW (ref 30–100)

## 2021-01-03 LAB — CREATININE, SERUM
Creatinine, Ser: 0.76 mg/dL (ref 0.44–1.00)
GFR, Estimated: >60 mL/min (ref >60)

## 2021-01-03 LAB — ABO/RH: ABO/RH(D): A POS

## 2021-01-03 SURGERY — EXTERNAL FIXATION, LOWER EXTREMITY
Anesthesia: General | Laterality: Left

## 2021-01-03 MED ORDER — FENTANYL CITRATE (PF) 250 MCG/5ML IJ SOLN
INTRAMUSCULAR | Status: DC | PRN
  Administered 2021-01-03: 50 ug via INTRAVENOUS
  Administered 2021-01-03: 25 ug via INTRAVENOUS
  Administered 2021-01-03: 50 ug via INTRAVENOUS
  Administered 2021-01-03 (×2): 25 ug via INTRAVENOUS
  Administered 2021-01-03: 50 ug via INTRAVENOUS
  Administered 2021-01-03: 25 ug via INTRAVENOUS

## 2021-01-03 MED ORDER — FENTANYL CITRATE (PF) 100 MCG/2ML IJ SOLN
INTRAMUSCULAR | Status: AC
  Filled 2021-01-03: qty 2

## 2021-01-03 MED ORDER — LACTATED RINGERS IV SOLN: INTRAVENOUS | Status: DC

## 2021-01-03 MED ORDER — CEFAZOLIN SODIUM-DEXTROSE 2-4 GM/100ML-% IV SOLN
2.0000 g | Freq: Three times a day (TID) | INTRAVENOUS | Status: AC
  Administered 2021-01-03 – 2021-01-04 (×3): 2 g via INTRAVENOUS
  Filled 2021-01-03 (×3): qty 100

## 2021-01-03 MED ORDER — METHOCARBAMOL 500 MG PO TABS
500.0000 mg | ORAL_TABLET | Freq: Four times a day (QID) | ORAL | Status: DC | PRN
  Administered 2021-01-04 – 2021-01-09 (×4): 500 mg via ORAL
  Filled 2021-01-03 (×4): qty 1

## 2021-01-03 MED ORDER — HYDROMORPHONE HCL 1 MG/ML IJ SOLN
0.5000 mg | INTRAMUSCULAR | Status: DC | PRN
  Administered 2021-01-04 – 2021-01-09 (×12): 1 mg via INTRAVENOUS
  Filled 2021-01-03 (×12): qty 1

## 2021-01-03 MED ORDER — OXYCODONE HCL 5 MG/5ML PO SOLN: 5.0000 mg | Freq: Once | ORAL | Status: DC | PRN

## 2021-01-03 MED ORDER — METOPROLOL TARTRATE 5 MG/5ML IV SOLN: 5.0000 mg | Freq: Four times a day (QID) | INTRAVENOUS | Status: DC | PRN

## 2021-01-03 MED ORDER — FENTANYL CITRATE (PF) 250 MCG/5ML IJ SOLN
INTRAMUSCULAR | Status: AC
  Filled 2021-01-03: qty 5

## 2021-01-03 MED ORDER — ONDANSETRON 4 MG PO TBDP: 4.0000 mg | ORAL_TABLET | Freq: Four times a day (QID) | ORAL | Status: DC | PRN

## 2021-01-03 MED ORDER — ENOXAPARIN SODIUM 30 MG/0.3ML ~~LOC~~ SOLN: 30.0000 mg | Freq: Two times a day (BID) | SUBCUTANEOUS | Status: DC

## 2021-01-03 MED ORDER — ROPIVACAINE HCL 5 MG/ML IJ SOLN
INTRAMUSCULAR | Status: DC | PRN
  Administered 2021-01-03: 30 mL via PERINEURAL

## 2021-01-03 MED ORDER — OXYCODONE HCL 5 MG PO TABS: 5.0000 mg | ORAL_TABLET | Freq: Once | ORAL | Status: DC | PRN

## 2021-01-03 MED ORDER — OXYCODONE HCL 5 MG PO TABS
5.0000 mg | ORAL_TABLET | ORAL | Status: DC | PRN
  Administered 2021-01-03 – 2021-01-04 (×3): 5 mg via ORAL
  Administered 2021-01-05 (×2): 10 mg via ORAL
  Administered 2021-01-06: 5 mg via ORAL
  Administered 2021-01-07 (×2): 10 mg via ORAL
  Filled 2021-01-03: qty 1
  Filled 2021-01-03: qty 2
  Filled 2021-01-03: qty 1
  Filled 2021-01-03: qty 2
  Filled 2021-01-03: qty 1
  Filled 2021-01-03 (×4): qty 2

## 2021-01-03 MED ORDER — METOCLOPRAMIDE HCL 5 MG PO TABS: 5.0000 mg | ORAL_TABLET | Freq: Three times a day (TID) | ORAL | Status: DC | PRN

## 2021-01-03 MED ORDER — VANCOMYCIN HCL 1000 MG IV SOLR
INTRAVENOUS | Status: DC | PRN
  Administered 2021-01-03: 1000 mg via TOPICAL

## 2021-01-03 MED ORDER — ENOXAPARIN SODIUM 30 MG/0.3ML ~~LOC~~ SOLN
30.0000 mg | Freq: Two times a day (BID) | SUBCUTANEOUS | Status: DC
  Administered 2021-01-04 – 2021-01-07 (×7): 30 mg via SUBCUTANEOUS
  Filled 2021-01-03 (×8): qty 0.3

## 2021-01-03 MED ORDER — LIDOCAINE 2% (20 MG/ML) 5 ML SYRINGE
INTRAMUSCULAR | Status: DC | PRN
  Administered 2021-01-03: 40 mg via INTRAVENOUS

## 2021-01-03 MED ORDER — PROPOFOL 10 MG/ML IV BOLUS
INTRAVENOUS | Status: AC
  Filled 2021-01-03: qty 20

## 2021-01-03 MED ORDER — ONDANSETRON HCL 4 MG/2ML IJ SOLN: 4.0000 mg | Freq: Four times a day (QID) | INTRAMUSCULAR | Status: DC | PRN

## 2021-01-03 MED ORDER — DOCUSATE SODIUM 100 MG PO CAPS: 100.0000 mg | ORAL_CAPSULE | Freq: Two times a day (BID) | ORAL | Status: DC

## 2021-01-03 MED ORDER — ACETAMINOPHEN 325 MG PO TABS: 650.0000 mg | ORAL_TABLET | ORAL | Status: DC | PRN

## 2021-01-03 MED ORDER — ORAL CARE MOUTH RINSE: 15.0000 mL | Freq: Once | OROMUCOSAL | Status: AC

## 2021-01-03 MED ORDER — MIDAZOLAM HCL 2 MG/2ML IJ SOLN: 2.0000 mg | Freq: Once | INTRAMUSCULAR | Status: AC

## 2021-01-03 MED ORDER — POTASSIUM CHLORIDE IN NACL 20-0.9 MEQ/L-% IV SOLN
INTRAVENOUS | Status: DC
  Filled 2021-01-03 (×3): qty 1000

## 2021-01-03 MED ORDER — POLYETHYLENE GLYCOL 3350 17 G PO PACK: 17.0000 g | PACK | Freq: Every day | ORAL | Status: DC | PRN

## 2021-01-03 MED ORDER — METOCLOPRAMIDE HCL 5 MG/ML IJ SOLN: 5.0000 mg | Freq: Three times a day (TID) | INTRAMUSCULAR | Status: DC | PRN

## 2021-01-03 MED ORDER — CHLORHEXIDINE GLUCONATE 0.12 % MT SOLN
15.0000 mL | OROMUCOSAL | Status: AC
  Filled 2021-01-03 (×2): qty 15

## 2021-01-03 MED ORDER — ONDANSETRON HCL 4 MG/2ML IJ SOLN
4.0000 mg | Freq: Four times a day (QID) | INTRAMUSCULAR | Status: DC | PRN
  Administered 2021-01-05: 4 mg via INTRAVENOUS
  Filled 2021-01-03: qty 2

## 2021-01-03 MED ORDER — CHLORHEXIDINE GLUCONATE 0.12 % MT SOLN
15.0000 mL | Freq: Once | OROMUCOSAL | Status: AC
  Administered 2021-01-03: 15 mL via OROMUCOSAL

## 2021-01-03 MED ORDER — DEXTROSE-NACL 5-0.45 % IV SOLN: INTRAVENOUS | Status: DC

## 2021-01-03 MED ORDER — MORPHINE SULFATE (PF) 2 MG/ML IV SOLN
2.0000 mg | INTRAVENOUS | Status: DC | PRN
  Administered 2021-01-03: 4 mg via INTRAVENOUS
  Administered 2021-01-03 (×2): 2 mg via INTRAVENOUS
  Filled 2021-01-03: qty 1
  Filled 2021-01-03: qty 2
  Filled 2021-01-03: qty 1

## 2021-01-03 MED ORDER — AMISULPRIDE (ANTIEMETIC) 5 MG/2ML IV SOLN: 10.0000 mg | Freq: Once | INTRAVENOUS | Status: DC | PRN

## 2021-01-03 MED ORDER — DEXAMETHASONE SODIUM PHOSPHATE 10 MG/ML IJ SOLN
INTRAMUSCULAR | Status: DC | PRN
  Administered 2021-01-03: 10 mg via INTRAVENOUS

## 2021-01-03 MED ORDER — VANCOMYCIN HCL 1000 MG IV SOLR
INTRAVENOUS | Status: AC
  Filled 2021-01-03: qty 1000

## 2021-01-03 MED ORDER — ONDANSETRON HCL 4 MG/2ML IJ SOLN
INTRAMUSCULAR | Status: DC | PRN
  Administered 2021-01-03: 4 mg via INTRAVENOUS

## 2021-01-03 MED ORDER — METHOCARBAMOL 1000 MG/10ML IJ SOLN
500.0000 mg | Freq: Four times a day (QID) | INTRAVENOUS | Status: DC | PRN
  Filled 2021-01-03 (×3): qty 5

## 2021-01-03 MED ORDER — IOHEXOL 300 MG/ML  SOLN
100.0000 mL | Freq: Once | INTRAMUSCULAR | Status: AC | PRN
  Administered 2021-01-03: 100 mL via INTRAVENOUS

## 2021-01-03 MED ORDER — PROPOFOL 10 MG/ML IV BOLUS
INTRAVENOUS | Status: DC | PRN
  Administered 2021-01-03: 150 mg via INTRAVENOUS

## 2021-01-03 MED ORDER — MIDAZOLAM HCL 2 MG/2ML IJ SOLN
INTRAMUSCULAR | Status: AC
  Filled 2021-01-03: qty 2

## 2021-01-03 MED ORDER — DOCUSATE SODIUM 100 MG PO CAPS
100.0000 mg | ORAL_CAPSULE | Freq: Two times a day (BID) | ORAL | Status: DC
  Administered 2021-01-03 – 2021-01-11 (×13): 100 mg via ORAL
  Filled 2021-01-03 (×15): qty 1

## 2021-01-03 MED ORDER — FENTANYL CITRATE (PF) 100 MCG/2ML IJ SOLN
25.0000 ug | INTRAMUSCULAR | Status: DC | PRN
  Administered 2021-01-03: 50 ug via INTRAVENOUS

## 2021-01-03 MED ORDER — SCOPOLAMINE 1 MG/3DAYS TD PT72
MEDICATED_PATCH | TRANSDERMAL | Status: DC | PRN
  Administered 2021-01-03: 1 via TRANSDERMAL

## 2021-01-03 MED ORDER — SUGAMMADEX SODIUM 200 MG/2ML IV SOLN
INTRAVENOUS | Status: DC | PRN
  Administered 2021-01-03: 200 mg via INTRAVENOUS

## 2021-01-03 MED ORDER — MIDAZOLAM HCL 2 MG/2ML IJ SOLN
INTRAMUSCULAR | Status: AC
  Administered 2021-01-03: 2 mg via INTRAVENOUS
  Filled 2021-01-03: qty 2

## 2021-01-03 MED ORDER — DIPHENHYDRAMINE HCL 50 MG/ML IJ SOLN
INTRAMUSCULAR | Status: DC | PRN
  Administered 2021-01-03: 12.5 mg via INTRAVENOUS

## 2021-01-03 MED ORDER — 0.9 % SODIUM CHLORIDE (POUR BTL) OPTIME
TOPICAL | Status: DC | PRN
  Administered 2021-01-03: 1000 mL

## 2021-01-03 MED ORDER — ROCURONIUM BROMIDE 10 MG/ML (PF) SYRINGE
PREFILLED_SYRINGE | INTRAVENOUS | Status: DC | PRN
  Administered 2021-01-03: 60 mg via INTRAVENOUS

## 2021-01-03 MED ORDER — CHLORHEXIDINE GLUCONATE 4 % EX LIQD: 60.0000 mL | Freq: Once | CUTANEOUS | Status: DC

## 2021-01-03 MED ORDER — ONDANSETRON HCL 4 MG/2ML IJ SOLN: 4.0000 mg | Freq: Once | INTRAMUSCULAR | Status: DC | PRN

## 2021-01-03 MED ORDER — FENTANYL CITRATE (PF) 100 MCG/2ML IJ SOLN
INTRAMUSCULAR | Status: AC
  Administered 2021-01-03: 50 ug via INTRAVENOUS
  Filled 2021-01-03: qty 2

## 2021-01-03 MED ORDER — ACETAMINOPHEN 500 MG PO TABS
1000.0000 mg | ORAL_TABLET | Freq: Four times a day (QID) | ORAL | Status: DC
  Administered 2021-01-03 – 2021-01-11 (×27): 1000 mg via ORAL
  Filled 2021-01-03 (×31): qty 2

## 2021-01-03 MED ORDER — POVIDONE-IODINE 10 % EX SWAB: 2.0000 "application " | Freq: Once | CUTANEOUS | Status: DC

## 2021-01-03 MED ORDER — MIDAZOLAM HCL 2 MG/2ML IJ SOLN
INTRAMUSCULAR | Status: DC | PRN
  Administered 2021-01-03 (×2): 1 mg via INTRAVENOUS

## 2021-01-03 MED ORDER — FENTANYL CITRATE (PF) 100 MCG/2ML IJ SOLN: 50.0000 ug | Freq: Once | INTRAMUSCULAR | Status: AC

## 2021-01-03 MED ORDER — CEFAZOLIN SODIUM-DEXTROSE 2-4 GM/100ML-% IV SOLN
2.0000 g | INTRAVENOUS | Status: AC
  Administered 2021-01-03: 2 g via INTRAVENOUS
  Filled 2021-01-03: qty 100

## 2021-01-03 MED ORDER — DEXAMETHASONE SODIUM PHOSPHATE 10 MG/ML IJ SOLN
INTRAMUSCULAR | Status: DC | PRN
  Administered 2021-01-03 (×2): 4 mg

## 2021-01-03 MED ORDER — ONDANSETRON HCL 4 MG PO TABS: 4.0000 mg | ORAL_TABLET | Freq: Four times a day (QID) | ORAL | Status: DC | PRN

## 2021-01-03 SURGICAL SUPPLY — 100 items
APL PRP STRL LF DISP 70% ISPRP (MISCELLANEOUS) ×1
BAR EXFX 150X11 NS LF (EXFIX) ×2
BAR EXFX 400X11 NS LF (EXFIX) ×2
BAR GLASS FIBER EXFX 11X150 (EXFIX) ×4 IMPLANT
BAR GLASS FIBER EXFX 11X400 (EXFIX) ×4 IMPLANT
BIT DRILL 1.5 W/K-WIRE THRD 88 (BIT) ×2 IMPLANT
BIT DRILL CANN MED FLUTE 4.0 (BIT) ×1 IMPLANT
BIT DRILL CANN XTRAFIX 2.4 (DRILL) ×1 IMPLANT
BLADE AVERAGE 25X9 (BLADE) IMPLANT
BNDG CMPR 9X4 STRL LF SNTH (GAUZE/BANDAGES/DRESSINGS)
BNDG COHESIVE 4X5 TAN STRL (GAUZE/BANDAGES/DRESSINGS) ×2 IMPLANT
BNDG ELASTIC 3X5.8 VLCR STR LF (GAUZE/BANDAGES/DRESSINGS) ×2 IMPLANT
BNDG ELASTIC 4X5.8 VLCR STR LF (GAUZE/BANDAGES/DRESSINGS) ×2 IMPLANT
BNDG ELASTIC 6X5.8 VLCR STR LF (GAUZE/BANDAGES/DRESSINGS) ×2 IMPLANT
BNDG ESMARK 4X9 LF (GAUZE/BANDAGES/DRESSINGS) IMPLANT
BNDG GAUZE ELAST 4 BULKY (GAUZE/BANDAGES/DRESSINGS) ×4 IMPLANT
BRUSH SCRUB EZ PLAIN DRY (MISCELLANEOUS) ×4 IMPLANT
CAP PROTECTIVE TRANSFX 4.5X5MM (EXFIX) ×2 IMPLANT
CHLORAPREP W/TINT 26 (MISCELLANEOUS) ×2 IMPLANT
CLAMP BLUE BAR TO BAR (EXFIX) ×4 IMPLANT
CLAMP BLUE BAR TO PIN (EXFIX) ×8 IMPLANT
CLEANER TIP ELECTROSURG 2X2 (MISCELLANEOUS) ×2 IMPLANT
CORD BIPOLAR FORCEPS 12FT (ELECTRODE) ×2 IMPLANT
COVER SURGICAL LIGHT HANDLE (MISCELLANEOUS) ×4 IMPLANT
COVER WAND RF STERILE (DRAPES) ×2 IMPLANT
DRAIN PENROSE 1/4X12 LTX STRL (WOUND CARE) IMPLANT
DRAPE C-ARM 42X72 X-RAY (DRAPES) ×2 IMPLANT
DRAPE C-ARMOR (DRAPES) ×2 IMPLANT
DRAPE HALF SHEET 40X57 (DRAPES) ×2 IMPLANT
DRAPE IMP U-DRAPE 54X76 (DRAPES) ×4 IMPLANT
DRAPE INCISE IOBAN 66X45 STRL (DRAPES) IMPLANT
DRAPE ORTHO SPLIT 77X108 STRL (DRAPES) ×4
DRAPE SURG ORHT 6 SPLT 77X108 (DRAPES) ×2 IMPLANT
DRAPE U-SHAPE 47X51 STRL (DRAPES) ×2 IMPLANT
DRILL CANN 2.4 (DRILL) ×2
DRILL CANN 4.0MM (BIT) ×2
DRSG ADAPTIC 3X8 NADH LF (GAUZE/BANDAGES/DRESSINGS) ×2 IMPLANT
DRSG MEPITEL 4X7.2 (GAUZE/BANDAGES/DRESSINGS) IMPLANT
DRSG PAD ABDOMINAL 8X10 ST (GAUZE/BANDAGES/DRESSINGS) ×2 IMPLANT
ELECT REM PT RETURN 9FT ADLT (ELECTROSURGICAL) ×2
ELECTRODE REM PT RTRN 9FT ADLT (ELECTROSURGICAL) ×1 IMPLANT
EVACUATOR 1/8 PVC DRAIN (DRAIN) IMPLANT
GAUZE SPONGE 4X4 12PLY STRL (GAUZE/BANDAGES/DRESSINGS) ×2 IMPLANT
GLOVE BIO SURGEON STRL SZ 6.5 (GLOVE) ×6 IMPLANT
GLOVE BIO SURGEON STRL SZ7.5 (GLOVE) ×8 IMPLANT
GLOVE BIOGEL PI IND STRL 7.5 (GLOVE) ×1 IMPLANT
GLOVE BIOGEL PI INDICATOR 7.5 (GLOVE) ×1
GLOVE SURG UNDER POLY LF SZ6.5 (GLOVE) ×2 IMPLANT
GOWN STRL REUS W/ TWL LRG LVL3 (GOWN DISPOSABLE) ×2 IMPLANT
GOWN STRL REUS W/ TWL XL LVL3 (GOWN DISPOSABLE) ×1 IMPLANT
GOWN STRL REUS W/TWL LRG LVL3 (GOWN DISPOSABLE) ×4
GOWN STRL REUS W/TWL XL LVL3 (GOWN DISPOSABLE) ×2
KIT BASIN OR (CUSTOM PROCEDURE TRAY) ×2 IMPLANT
KIT TURNOVER KIT B (KITS) ×2 IMPLANT
LOOP VESSEL MAXI BLUE (MISCELLANEOUS) ×2 IMPLANT
MANIFOLD NEPTUNE II (INSTRUMENTS) ×2 IMPLANT
NEEDLE 22X1 1/2 (OR ONLY) (NEEDLE) IMPLANT
NEEDLE HYPO 25X1 1.5 SAFETY (NEEDLE) IMPLANT
NS IRRIG 1000ML POUR BTL (IV SOLUTION) ×2 IMPLANT
PACK ORTHO EXTREMITY (CUSTOM PROCEDURE TRAY) ×2 IMPLANT
PAD ARMBOARD 7.5X6 YLW CONV (MISCELLANEOUS) ×4 IMPLANT
PAD CAST 4YDX4 CTTN HI CHSV (CAST SUPPLIES) ×1 IMPLANT
PADDING CAST ABS 3INX4YD NS (CAST SUPPLIES) ×1
PADDING CAST ABS COTTON 3X4 (CAST SUPPLIES) ×1 IMPLANT
PADDING CAST COTTON 4X4 STRL (CAST SUPPLIES) ×2
PADDING CAST COTTON 6X4 STRL (CAST SUPPLIES) ×4 IMPLANT
PIN 4X100X20MM EXFIX LG BLUNT (EXFIX) ×4 IMPLANT
PIN CLAMP 2BAR 75MM BLUE (EXFIX) ×2 IMPLANT
PIN HALF 5X160X35 BLUNT TIP (EXFIX) ×4 IMPLANT
PIN TRANSFIXING 5.0 (EXFIX) ×2 IMPLANT
PLATE LCP TM 2.7X184 20H (Plate) ×2 IMPLANT
SCREW CORT ST SD 2X24 (Screw) ×2 IMPLANT
SCREW CORTEX 2.7 SLF-TPNG 16MM (Screw) ×2 IMPLANT
SCREW CORTEX 2.7X14 (Screw) ×2 IMPLANT
SCREW CORTEX SLFTPNG 22MM (Screw) ×2 IMPLANT
SCREW SELF TAP 12M (Screw) ×2 IMPLANT
SCREW SELF TAP 14MM (Screw) ×8 IMPLANT
SCREW SELF-TAP 2.7X12MM (Screw) ×2 IMPLANT
SPLINT PLASTER EXTRA FAST 3X15 (CAST SUPPLIES) ×1
SPLINT PLASTER GYPS XFAST 3X15 (CAST SUPPLIES) ×1 IMPLANT
SPONGE LAP 18X18 RF (DISPOSABLE) ×2 IMPLANT
STAPLER VISISTAT 35W (STAPLE) ×2 IMPLANT
STOCKINETTE IMPERVIOUS 9X36 MD (GAUZE/BANDAGES/DRESSINGS) ×2 IMPLANT
STRIP CLOSURE SKIN 1/2X4 (GAUZE/BANDAGES/DRESSINGS) IMPLANT
SUCTION FRAZIER HANDLE 10FR (MISCELLANEOUS) ×2
SUCTION TUBE FRAZIER 10FR DISP (MISCELLANEOUS) ×1 IMPLANT
SUT ETHILON 3 0 PS 1 (SUTURE) ×4 IMPLANT
SUT PROLENE 0 CT (SUTURE) IMPLANT
SUT VIC AB 0 CT1 27 (SUTURE) ×4
SUT VIC AB 0 CT1 27XBRD ANBCTR (SUTURE) ×2 IMPLANT
SUT VIC AB 2-0 CT1 27 (SUTURE) ×4
SUT VIC AB 2-0 CT1 TAPERPNT 27 (SUTURE) ×2 IMPLANT
SYR 5ML LL (SYRINGE) IMPLANT
SYR CONTROL 10ML LL (SYRINGE) ×2 IMPLANT
TOWEL GREEN STERILE (TOWEL DISPOSABLE) ×4 IMPLANT
TOWEL GREEN STERILE FF (TOWEL DISPOSABLE) ×4 IMPLANT
TRAY FOLEY MTR SLVR 16FR STAT (SET/KITS/TRAYS/PACK) IMPLANT
UNDERPAD 30X36 HEAVY ABSORB (UNDERPADS AND DIAPERS) ×2 IMPLANT
WATER STERILE IRR 1000ML POUR (IV SOLUTION) ×2 IMPLANT
YANKAUER SUCT BULB TIP NO VENT (SUCTIONS) IMPLANT
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
## 2021-01-03 NOTE — Anesthesia Postprocedure Evaluation (Signed)
Anesthesia Post Note  Patient: Joanna Wright  Procedure(s) Performed: EXTERNAL FIXATION ANKLE LEFT and right distal radius fracture ORIF (Left )     Patient location during evaluation: PACU Anesthesia Type: General Level of consciousness: awake and alert Pain management: pain level controlled Vital Signs Assessment: post-procedure vital signs reviewed and stable Respiratory status: spontaneous breathing, nonlabored ventilation and respiratory function stable Cardiovascular status: blood pressure returned to baseline and stable Postop Assessment: no apparent nausea or vomiting Anesthetic complications: no   No complications documented.  Last Vitals:  Vitals:   01/03/21 1436 01/03/21 1451  BP: (!) 172/99 (!) 159/100  Pulse:    Resp: (!) 24 (!) 21  Temp: 36.6 C   SpO2: 100%     Last Pain:  Vitals:   01/03/21 1436  TempSrc:   PainSc: [place]

## 2021-01-03 NOTE — Anesthesia Procedure Notes (Signed)
Anesthesia Regional Block: Supraclavicular block   Pre-Anesthetic Checklist: ,, timeout performed, Correct Patient, Correct Site, Correct Laterality, Correct Procedure, Correct Position, site marked, Risks and benefits discussed,  Surgical consent,  Pre-op evaluation,  At surgeon's request and post-op pain management  Laterality: Right  Prep: chloraprep       Needles:  Injection technique: Single-shot  Needle Type: Echogenic Stimulator Needle     Needle Length: 10cm  Needle Gauge: 20     Additional Needles:   Procedures:,,,, ultrasound used (permanent image in chart),,,,  Narrative:  Start time: 01/03/2021 11:12 AM End time: 01/03/2021 11:18 AM Injection made incrementally with aspirations every 5 mL.  Performed by: Personally  Anesthesiologist: Lidia Collum, MD  Additional Notes: Standard monitors applied. Skin prepped. Good needle visualization with ultrasound. Injection made in 5cc increments with no resistance to injection. Patient tolerated the procedure well.
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
## 2021-01-03 NOTE — Progress Notes (Signed)
OT Cancellation Note  Patient Details Name: Joanna Wright MRN: [id number] DOB: 1970/12/24   Cancelled Treatment:    Reason Eval/Treat Not Completed: Patient not medically ready (Pt to have wrist ORIF today possbily and ex fix for LE tomorrow. OT to continue to follow for OT eval.)   Jefferey Pica, OTR/L Acute Rehabilitation Services Pager: [phone] Office: [phone]  [name] 01/03/2021, 11:02 AM

## 2021-01-03 NOTE — Plan of Care (Signed)

## 2021-01-03 NOTE — Anesthesia Preprocedure Evaluation (Addendum)
Anesthesia Evaluation  Patient identified by MRN, date of birth, ID band Patient awake    Reviewed: Allergy & Precautions, NPO status , Patient's Chart, lab work & pertinent test results  History of Anesthesia Complications Negative for: history of anesthetic complications  Airway Mallampati: III  TM Distance: >3 FB Neck ROM: Full    Dental  (+) Teeth Intact   Pulmonary neg pulmonary ROS,    Pulmonary exam normal        Cardiovascular hypertension, Pt. on medications Normal cardiovascular exam     Neuro/Psych negative neurological ROS     GI/Hepatic negative GI ROS, Neg liver ROS,   Endo/Other  negative endocrine ROS  Renal/GU negative Renal ROS  negative genitourinary   Musculoskeletal negative musculoskeletal ROS (+)   Abdominal   Peds  Hematology  (+) anemia ,   Anesthesia Other Findings   Reproductive/Obstetrics                           Anesthesia Physical Anesthesia Plan  ASA: II  Anesthesia Plan: General   Post-op Pain Management:  Regional for Post-op pain   Induction: Intravenous  PONV Risk Score and Plan: 3 and Ondansetron, Dexamethasone, Treatment may vary due to age or medical condition and Midazolam  Airway Management Planned: Oral ETT  Additional Equipment: None  Intra-op Plan:   Post-operative Plan: Extubation in OR  Informed Consent: I have reviewed the patients History and Physical, chart, labs and discussed the procedure including the risks, benefits and alternatives for the proposed anesthesia with the patient or authorized representative who has indicated his/her understanding and acceptance.     Dental advisory given  Plan Discussed with:   Anesthesia Plan Comments:         Anesthesia Quick Evaluation

## 2021-01-03 NOTE — Transfer of Care (Signed)
Immediate Anesthesia Transfer of Care Note  Patient: Joanna Wright  Procedure(s) Performed: EXTERNAL FIXATION ANKLE LEFT and right distal radius fracture ORIF (Left )  Patient Location: PACU  Anesthesia Type:General  Level of Consciousness: drowsy  Airway & Oxygen Therapy: Patient Spontanous Breathing and Patient connected to face mask oxygen  Post-op Assessment: Report given to RN and Post -op Vital signs reviewed and stable  Post vital signs: Reviewed and stable  Last Vitals:  Vitals Value Taken Time  BP 172/99 01/03/21 1436  Temp    Pulse 121 01/03/21 1438  Resp 23 01/03/21 1438  SpO2 100 % 01/03/21 1438  Vitals shown include unvalidated device data.  Last Pain:  Vitals:   01/03/21 1125  TempSrc:   PainSc: 0-No pain         Complications: No complications documented.
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
## 2021-01-03 NOTE — Anesthesia Procedure Notes (Signed)
Procedure Name: Intubation Date/Time: 01/03/2021 12:06 PM Performed by: Clearnce Sorrel, CRNA Pre-anesthesia Checklist: Patient identified, Emergency Drugs available, Suction available, Patient being monitored and Timeout performed Patient Re-evaluated:Patient Re-evaluated prior to induction Oxygen Delivery Method: Circle system utilized Preoxygenation: Pre-oxygenation with 100% oxygen Induction Type: IV induction Ventilation: Mask ventilation without difficulty Laryngoscope Size: Mac and 3 Grade View: Grade I Tube type: Oral Tube size: 7.0 mm Number of attempts: 1 Airway Equipment and Method: Stylet Placement Confirmation: ETT inserted through vocal cords under direct vision,  positive ETCO2 and breath sounds checked- equal and bilateral Secured at: 22 cm Tube secured with: Tape Dental Injury: Teeth and Oropharynx as per pre-operative assessment

## 2021-01-03 NOTE — Progress Notes (Addendum)
0900 Received pt from ED via stretcher, transferred pt to bed. Right short arm splint with ace wrap dry and intact. Left short leg splint with ace wrap dry and intact. Pre-op transport came in right after the pt got to the unit. 0906 Pt to short stay, report was given. 1640 Received pt from PACU. A&O x4. Right hand is numb due to nerve block, fingers are warm. LLE with exfix intact, elevated LLE to pillows.

## 2021-01-04 ENCOUNTER — Encounter (HOSPITAL_COMMUNITY): Payer: Self-pay | Admitting: Student

## 2021-01-04 LAB — BASIC METABOLIC PANEL
Anion gap: 10 (ref 5–15)
BUN: 6 mg/dL (ref 6–20)
CO2: 22 mmol/L (ref 22–32)
Calcium: 8.5 mg/dL — ABNORMAL LOW (ref 8.9–10.3)
Chloride: 106 mmol/L (ref 98–111)
Creatinine, Ser: 0.86 mg/dL (ref 0.44–1.00)
GFR, Estimated: >60 mL/min (ref >60)
Glucose, Bld: 140 mg/dL — ABNORMAL HIGH (ref 70–99)
Potassium: 4.3 mmol/L (ref 3.5–5.1)
Sodium: 138 mmol/L (ref 135–145)

## 2021-01-04 LAB — CBC
HCT: 32.4 % — ABNORMAL LOW (ref 36.0–46.0)
Hemoglobin: 10.9 g/dL — ABNORMAL LOW (ref 12.0–15.0)
MCH: 32.2 pg (ref 26.0–34.0)
MCHC: 33.6 g/dL (ref 30.0–36.0)
MCV: 95.9 fL (ref 80.0–100.0)
Platelets: 256 10*3/uL (ref 150–400)
RBC: 3.38 MIL/uL — ABNORMAL LOW (ref 3.87–5.11)
RDW: 12.8 % (ref 11.5–15.5)
WBC: 7.4 10*3/uL (ref 4.0–10.5)
nRBC: 0 % (ref 0.0–0.2)

## 2021-01-04 MED ORDER — VITAMIN D (ERGOCALCIFEROL) 1.25 MG (50000 UNIT) PO CAPS
50000.0000 [IU] | ORAL_CAPSULE | ORAL | Status: DC
  Administered 2021-01-04 – 2021-01-11 (×2): 50000 [IU] via ORAL
  Filled 2021-01-04 (×2): qty 1
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
## 2021-01-04 NOTE — Progress Notes (Signed)
Inpatient Rehab Admissions Coordinator Note:   Per therapy recommendations, pt was screened for CIR candidacy by Shann Medal, PT, DPT.  At this time we are recommending a CIR consult and I willl place an order per our protocol.  Please contact me with questions.   Shann Medal, PT, DPT [phone] 01/04/21 1:16 PM

## 2021-01-05 LAB — CBC
HCT: 28.9 % — ABNORMAL LOW (ref 36.0–46.0)
Hemoglobin: 9.7 g/dL — ABNORMAL LOW (ref 12.0–15.0)
MCH: 32.7 pg (ref 26.0–34.0)
MCHC: 33.6 g/dL (ref 30.0–36.0)
MCV: 97.3 fL (ref 80.0–100.0)
Platelets: 266 10*3/uL (ref 150–400)
RBC: 2.97 MIL/uL — ABNORMAL LOW (ref 3.87–5.11)
RDW: 13.1 % (ref 11.5–15.5)
WBC: 8.4 10*3/uL (ref 4.0–10.5)
nRBC: 0 % (ref 0.0–0.2)

## 2021-01-05 LAB — BASIC METABOLIC PANEL
Anion gap: 4 — ABNORMAL LOW (ref 5–15)
BUN: 5 mg/dL — ABNORMAL LOW (ref 6–20)
CO2: 26 mmol/L (ref 22–32)
Calcium: 7.8 mg/dL — ABNORMAL LOW (ref 8.9–10.3)
Chloride: 107 mmol/L (ref 98–111)
Creatinine, Ser: 0.79 mg/dL (ref 0.44–1.00)
GFR, Estimated: >60 mL/min (ref >60)
Glucose, Bld: 97 mg/dL (ref 70–99)
Potassium: 3.2 mmol/L — ABNORMAL LOW (ref 3.5–5.1)
Sodium: 137 mmol/L (ref 135–145)

## 2021-01-05 MED ORDER — POTASSIUM CHLORIDE CRYS ER 20 MEQ PO TBCR
20.0000 meq | EXTENDED_RELEASE_TABLET | Freq: Three times a day (TID) | ORAL | Status: AC
  Administered 2021-01-05 (×3): 20 meq via ORAL
  Filled 2021-01-05 (×3): qty 1

## 2021-01-05 MED ORDER — SODIUM CHLORIDE 0.9% FLUSH: 3.0000 mL | INTRAVENOUS | Status: DC | PRN

## 2021-01-05 MED ORDER — POLYETHYLENE GLYCOL 3350 17 G PO PACK
17.0000 g | PACK | Freq: Every day | ORAL | Status: DC
  Administered 2021-01-05 – 2021-01-11 (×4): 17 g via ORAL
  Filled 2021-01-05 (×6): qty 1

## 2021-01-05 MED ORDER — SODIUM CHLORIDE 0.9 % IV SOLN: 250.0000 mL | INTRAVENOUS | Status: DC | PRN

## 2021-01-05 MED ORDER — SODIUM CHLORIDE 0.9% FLUSH
3.0000 mL | Freq: Two times a day (BID) | INTRAVENOUS | Status: DC
  Administered 2021-01-05 – 2021-01-11 (×8): 3 mL via INTRAVENOUS
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
## 2021-01-06 LAB — BASIC METABOLIC PANEL
Anion gap: 9 (ref 5–15)
BUN: 5 mg/dL — ABNORMAL LOW (ref 6–20)
CO2: 26 mmol/L (ref 22–32)
Calcium: 8.6 mg/dL — ABNORMAL LOW (ref 8.9–10.3)
Chloride: 102 mmol/L (ref 98–111)
Creatinine, Ser: 0.74 mg/dL (ref 0.44–1.00)
GFR, Estimated: >60 mL/min (ref >60)
Glucose, Bld: 98 mg/dL (ref 70–99)
Potassium: 3.9 mmol/L (ref 3.5–5.1)
Sodium: 137 mmol/L (ref 135–145)

## 2021-01-06 LAB — CBC
HCT: 30.5 % — ABNORMAL LOW (ref 36.0–46.0)
Hemoglobin: 10.2 g/dL — ABNORMAL LOW (ref 12.0–15.0)
MCH: 32.1 pg (ref 26.0–34.0)
MCHC: 33.4 g/dL (ref 30.0–36.0)
MCV: 95.9 fL (ref 80.0–100.0)
Platelets: 311 10*3/uL (ref 150–400)
RBC: 3.18 MIL/uL — ABNORMAL LOW (ref 3.87–5.11)
RDW: 12.8 % (ref 11.5–15.5)
WBC: 7.8 10*3/uL (ref 4.0–10.5)
nRBC: 0 % (ref 0.0–0.2)

## 2021-01-06 LAB — GLUCOSE, CAPILLARY: Glucose-Capillary: 127 mg/dL — ABNORMAL HIGH (ref 70–99)

## 2021-01-06 NOTE — Plan of Care (Signed)
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
## 2021-01-07 NOTE — Plan of Care (Signed)

## 2021-01-07 NOTE — Plan of Care (Signed)
?  Problem: Activity: ?Goal: Risk for activity intolerance will decrease ?Outcome: Progressing ?  ?Problem: Safety: ?Goal: Ability to remain free from injury will improve ?Outcome: Progressing ?  ?Problem: Pain Managment: ?Goal: General experience of comfort will improve ?Outcome: Progressing ?  ?
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
## 2021-01-07 NOTE — Progress Notes (Signed)
PT Cancellation Note  Patient Details Name: Joanna Wright MRN: [id number] DOB: [birth year]   Cancelled Treatment:    Reason Eval/Treat Not Completed: Other (comment) (pt sleeping). Pt asleep upon arrival with assumed family reporting this was the first time today she has gotten uninterrupted sleep, thus family and PT collaborated and decided to let pt continue to sleep. Will follow-up another day.   Moishe Spice, PT, DPT [facility] Services  Pager: [phone] Office: [place] 01/07/2021, 5:04 PM

## 2021-01-08 ENCOUNTER — Inpatient Hospital Stay (HOSPITAL_COMMUNITY): Payer: PRIVATE HEALTH INSURANCE | Admitting: Anesthesiology

## 2021-01-08 ENCOUNTER — Encounter (HOSPITAL_COMMUNITY): Payer: Self-pay

## 2021-01-08 ENCOUNTER — Inpatient Hospital Stay (HOSPITAL_COMMUNITY): Payer: PRIVATE HEALTH INSURANCE

## 2021-01-08 ENCOUNTER — Encounter (HOSPITAL_COMMUNITY): Admission: EM | Disposition: A | Discharge status: Rehabilitation Facility | Payer: Self-pay | Source: Home / Self Care

## 2021-01-08 HISTORY — PX: EXTERNAL FIXATION REMOVAL: SHX5040

## 2021-01-08 HISTORY — PX: OPEN REDUCTION INTERNAL FIXATION (ORIF) TIBIA/FIBULA FRACTURE: SHX5992

## 2021-01-08 LAB — SURGICAL PCR SCREEN
MRSA, PCR: NEGATIVE
Staphylococcus aureus: NEGATIVE

## 2021-01-08 SURGERY — OPEN REDUCTION INTERNAL FIXATION (ORIF) TIBIA/FIBULA FRACTURE
Anesthesia: General | Site: Leg Lower | Laterality: Left

## 2021-01-08 MED ORDER — CHLORHEXIDINE GLUCONATE 0.12 % MT SOLN
15.0000 mL | OROMUCOSAL | Status: AC
  Filled 2021-01-08: qty 15

## 2021-01-08 MED ORDER — CLONIDINE HCL (ANALGESIA) 100 MCG/ML EP SOLN
EPIDURAL | Status: DC | PRN
  Administered 2021-01-08: 100 ug

## 2021-01-08 MED ORDER — LIDOCAINE 2% (20 MG/ML) 5 ML SYRINGE
INTRAMUSCULAR | Status: DC | PRN
  Administered 2021-01-08: 20 mg via INTRAVENOUS

## 2021-01-08 MED ORDER — VANCOMYCIN HCL 1000 MG IV SOLR
INTRAVENOUS | Status: AC
  Filled 2021-01-08: qty 1000

## 2021-01-08 MED ORDER — PHENYLEPHRINE HCL-NACL 10-0.9 MG/250ML-% IV SOLN
INTRAVENOUS | Status: DC | PRN
  Administered 2021-01-08: 20 ug/min via INTRAVENOUS

## 2021-01-08 MED ORDER — TOBRAMYCIN SULFATE 1.2 G IJ SOLR
INTRAMUSCULAR | Status: AC
  Filled 2021-01-08: qty 2.4

## 2021-01-08 MED ORDER — LACTATED RINGERS IV SOLN: INTRAVENOUS | Status: DC

## 2021-01-08 MED ORDER — FENTANYL CITRATE (PF) 100 MCG/2ML IJ SOLN
25.0000 ug | INTRAMUSCULAR | Status: DC | PRN
  Administered 2021-01-08: 25 ug via INTRAVENOUS

## 2021-01-08 MED ORDER — POTASSIUM CHLORIDE IN NACL 20-0.9 MEQ/L-% IV SOLN
INTRAVENOUS | Status: DC
  Filled 2021-01-08: qty 1000

## 2021-01-08 MED ORDER — SUGAMMADEX SODIUM 200 MG/2ML IV SOLN
INTRAVENOUS | Status: DC | PRN
  Administered 2021-01-08: 200 mg via INTRAVENOUS

## 2021-01-08 MED ORDER — CEFAZOLIN SODIUM-DEXTROSE 2-3 GM-%(50ML) IV SOLR
INTRAVENOUS | Status: DC | PRN
  Administered 2021-01-08: 2 g via INTRAVENOUS

## 2021-01-08 MED ORDER — ENOXAPARIN SODIUM 30 MG/0.3ML ~~LOC~~ SOLN
30.0000 mg | Freq: Two times a day (BID) | SUBCUTANEOUS | Status: DC
Start: 2021-01-09 — End: 2021-01-11
  Administered 2021-01-09 – 2021-01-11 (×5): 30 mg via SUBCUTANEOUS
  Filled 2021-01-08 (×5): qty 0.3

## 2021-01-08 MED ORDER — CEFAZOLIN SODIUM-DEXTROSE 2-4 GM/100ML-% IV SOLN
2.0000 g | Freq: Three times a day (TID) | INTRAVENOUS | Status: AC
  Administered 2021-01-08 – 2021-01-09 (×3): 2 g via INTRAVENOUS
  Filled 2021-01-08 (×3): qty 100

## 2021-01-08 MED ORDER — FENTANYL CITRATE (PF) 250 MCG/5ML IJ SOLN
INTRAMUSCULAR | Status: DC | PRN
  Administered 2021-01-08 (×2): 25 ug via INTRAVENOUS
  Administered 2021-01-08 (×2): 50 ug via INTRAVENOUS

## 2021-01-08 MED ORDER — ONDANSETRON HCL 4 MG/2ML IJ SOLN
INTRAMUSCULAR | Status: DC | PRN
  Administered 2021-01-08: 4 mg via INTRAVENOUS

## 2021-01-08 MED ORDER — 0.9 % SODIUM CHLORIDE (POUR BTL) OPTIME
TOPICAL | Status: DC | PRN
  Administered 2021-01-08: 1000 mL

## 2021-01-08 MED ORDER — CEFAZOLIN SODIUM-DEXTROSE 2-4 GM/100ML-% IV SOLN
INTRAVENOUS | Status: AC
  Filled 2021-01-08: qty 100

## 2021-01-08 MED ORDER — OXYCODONE HCL 5 MG PO TABS
5.0000 mg | ORAL_TABLET | ORAL | Status: DC | PRN
  Administered 2021-01-10: 10 mg via ORAL
  Filled 2021-01-08 (×3): qty 2

## 2021-01-08 MED ORDER — BUPIVACAINE-EPINEPHRINE (PF) 0.5% -1:200000 IJ SOLN
INTRAMUSCULAR | Status: DC | PRN
  Administered 2021-01-08: 30 mL via PERINEURAL

## 2021-01-08 MED ORDER — MIDAZOLAM HCL 2 MG/2ML IJ SOLN
INTRAMUSCULAR | Status: AC
  Administered 2021-01-08: 2 mg via INTRAVENOUS
  Filled 2021-01-08: qty 2

## 2021-01-08 MED ORDER — TOBRAMYCIN SULFATE 1.2 G IJ SOLR
INTRAMUSCULAR | Status: DC | PRN
  Administered 2021-01-08: 1.2 g via TOPICAL

## 2021-01-08 MED ORDER — PROPOFOL 10 MG/ML IV BOLUS
INTRAVENOUS | Status: DC | PRN
  Administered 2021-01-08: 150 mg via INTRAVENOUS

## 2021-01-08 MED ORDER — FENTANYL CITRATE (PF) 100 MCG/2ML IJ SOLN: 50.0000 ug | Freq: Once | INTRAMUSCULAR | Status: AC

## 2021-01-08 MED ORDER — ROPIVACAINE HCL 5 MG/ML IJ SOLN
INTRAMUSCULAR | Status: DC | PRN
  Administered 2021-01-08: 20 mL via PERINEURAL

## 2021-01-08 MED ORDER — CHLORHEXIDINE GLUCONATE 0.12 % MT SOLN
15.0000 mL | OROMUCOSAL | Status: AC
  Administered 2021-01-08: 15 mL via OROMUCOSAL

## 2021-01-08 MED ORDER — FENTANYL CITRATE (PF) 100 MCG/2ML IJ SOLN
INTRAMUSCULAR | Status: AC
  Filled 2021-01-08: qty 2

## 2021-01-08 MED ORDER — MIDAZOLAM HCL 2 MG/2ML IJ SOLN
INTRAMUSCULAR | Status: AC
  Filled 2021-01-08: qty 2

## 2021-01-08 MED ORDER — PROPOFOL 10 MG/ML IV BOLUS
INTRAVENOUS | Status: AC
  Filled 2021-01-08: qty 20

## 2021-01-08 MED ORDER — AMISULPRIDE (ANTIEMETIC) 5 MG/2ML IV SOLN
10.0000 mg | Freq: Once | INTRAVENOUS | Status: DC | PRN
Start: 2021-01-08 — End: 2021-01-08

## 2021-01-08 MED ORDER — MIDAZOLAM HCL 2 MG/2ML IJ SOLN: 2.0000 mg | Freq: Once | INTRAMUSCULAR | Status: AC

## 2021-01-08 MED ORDER — ROCURONIUM BROMIDE 10 MG/ML (PF) SYRINGE
PREFILLED_SYRINGE | INTRAVENOUS | Status: DC | PRN
  Administered 2021-01-08 (×2): 20 mg via INTRAVENOUS
  Administered 2021-01-08: 40 mg via INTRAVENOUS
  Administered 2021-01-08: 20 mg via INTRAVENOUS

## 2021-01-08 MED ORDER — DEXAMETHASONE SODIUM PHOSPHATE 10 MG/ML IJ SOLN
INTRAMUSCULAR | Status: DC | PRN
  Administered 2021-01-08: 4 mg via INTRAVENOUS

## 2021-01-08 MED ORDER — FENTANYL CITRATE (PF) 100 MCG/2ML IJ SOLN
INTRAMUSCULAR | Status: AC
  Administered 2021-01-08: 50 ug via INTRAVENOUS
  Filled 2021-01-08: qty 2

## 2021-01-08 MED ORDER — FENTANYL CITRATE (PF) 250 MCG/5ML IJ SOLN
INTRAMUSCULAR | Status: AC
  Filled 2021-01-08: qty 5

## 2021-01-08 MED ORDER — LACTATED RINGERS IV SOLN: INTRAVENOUS | Status: DC | PRN

## 2021-01-08 MED ORDER — VANCOMYCIN HCL 1000 MG IV SOLR
INTRAVENOUS | Status: DC | PRN
  Administered 2021-01-08: 1000 mg via TOPICAL

## 2021-01-08 MED ORDER — OXYCODONE HCL 5 MG PO TABS
10.0000 mg | ORAL_TABLET | ORAL | Status: DC | PRN
Start: 2021-01-08 — End: 2021-01-11
  Administered 2021-01-08: 15 mg via ORAL
  Administered 2021-01-09: 10 mg via ORAL
  Administered 2021-01-09 (×2): 15 mg via ORAL
  Administered 2021-01-10 (×2): 10 mg via ORAL
  Administered 2021-01-10: 15 mg via ORAL
  Administered 2021-01-11 (×3): 10 mg via ORAL
  Filled 2021-01-08: qty 2
  Filled 2021-01-08: qty 3
  Filled 2021-01-08: qty 2
  Filled 2021-01-08: qty 3
  Filled 2021-01-08 (×2): qty 2
  Filled 2021-01-08 (×2): qty 3
  Filled 2021-01-08: qty 2

## 2021-01-08 SURGICAL SUPPLY — 87 items
APL PRP STRL LF DISP 70% ISPRP (MISCELLANEOUS) ×2
BANDAGE ESMARK 6X9 LF (GAUZE/BANDAGES/DRESSINGS) ×1 IMPLANT
BIT DRILL 2.0 (BIT) ×2
BIT DRILL 2.5X2.75 QC CALB (BIT) ×1 IMPLANT
BIT DRILL 2XNS DISP SS SM FRAG (BIT) IMPLANT
BIT DRILL CALIBRATED 2.7 (BIT) ×1 IMPLANT
BIT DRL 2XNS DISP SS SM FRAG (BIT) ×1
BNDG CMPR 9X6 STRL LF SNTH (GAUZE/BANDAGES/DRESSINGS)
BNDG COHESIVE 4X5 TAN STRL (GAUZE/BANDAGES/DRESSINGS) ×1 IMPLANT
BNDG ELASTIC 4X5.8 VLCR STR LF (GAUZE/BANDAGES/DRESSINGS) ×2 IMPLANT
BNDG ELASTIC 6X5.8 VLCR STR LF (GAUZE/BANDAGES/DRESSINGS) ×2 IMPLANT
BNDG ESMARK 6X9 LF (GAUZE/BANDAGES/DRESSINGS)
BNDG GAUZE ELAST 4 BULKY (GAUZE/BANDAGES/DRESSINGS) ×2 IMPLANT
BRUSH SCRUB EZ PLAIN DRY (MISCELLANEOUS) ×2 IMPLANT
CHLORAPREP W/TINT 26 (MISCELLANEOUS) ×3 IMPLANT
COVER SURGICAL LIGHT HANDLE (MISCELLANEOUS) ×3 IMPLANT
COVER WAND RF STERILE (DRAPES) ×2 IMPLANT
DRAPE C-ARM 42X72 X-RAY (DRAPES) ×2 IMPLANT
DRAPE C-ARMOR (DRAPES) ×2 IMPLANT
DRAPE ORTHO SPLIT 77X108 STRL (DRAPES) ×4
DRAPE SURG ORHT 6 SPLT 77X108 (DRAPES) ×2 IMPLANT
DRAPE U-SHAPE 47X51 STRL (DRAPES) ×2 IMPLANT
DRILL SLEEVE 2.7 DIST TIB (TRAUMA) ×1
DRSG ADAPTIC 3X8 NADH LF (GAUZE/BANDAGES/DRESSINGS) ×2 IMPLANT
DRSG PAD ABDOMINAL 8X10 ST (GAUZE/BANDAGES/DRESSINGS) ×2 IMPLANT
ELECT REM PT RETURN 9FT ADLT (ELECTROSURGICAL) ×2
ELECTRODE REM PT RTRN 9FT ADLT (ELECTROSURGICAL) ×1 IMPLANT
GAUZE SPONGE 4X4 12PLY STRL (GAUZE/BANDAGES/DRESSINGS) ×3 IMPLANT
GLOVE BIO SURGEON STRL SZ 6.5 (GLOVE) ×6 IMPLANT
GLOVE BIO SURGEON STRL SZ7.5 (GLOVE) ×8 IMPLANT
GLOVE BIOGEL PI IND STRL 7.5 (GLOVE) ×1 IMPLANT
GLOVE BIOGEL PI INDICATOR 7.5 (GLOVE) ×1
GLOVE SURG UNDER POLY LF SZ6.5 (GLOVE) ×2 IMPLANT
GOWN STRL REUS W/ TWL LRG LVL3 (GOWN DISPOSABLE) ×2 IMPLANT
GOWN STRL REUS W/TWL LRG LVL3 (GOWN DISPOSABLE) ×6
K-WIRE ACE 1.6X6 (WIRE) ×8
KIT BASIN OR (CUSTOM PROCEDURE TRAY) ×2 IMPLANT
KIT TURNOVER KIT B (KITS) ×2 IMPLANT
KWIRE ACE 1.6X6 (WIRE) IMPLANT
MANIFOLD NEPTUNE II (INSTRUMENTS) ×2 IMPLANT
NDL HYPO 21X1.5 SAFETY (NEEDLE) IMPLANT
NDL HYPO 25GX1X1/2 BEV (NEEDLE) ×1 IMPLANT
NEEDLE HYPO 21X1.5 SAFETY (NEEDLE) IMPLANT
NEEDLE HYPO 25GX1X1/2 BEV (NEEDLE) IMPLANT
NS IRRIG 1000ML POUR BTL (IV SOLUTION) ×2 IMPLANT
PACK TOTAL JOINT (CUSTOM PROCEDURE TRAY) ×2 IMPLANT
PAD ARMBOARD 7.5X6 YLW CONV (MISCELLANEOUS) ×4 IMPLANT
PAD CAST 4YDX4 CTTN HI CHSV (CAST SUPPLIES) IMPLANT
PADDING CAST COTTON 4X4 STRL (CAST SUPPLIES) ×6
PADDING CAST COTTON 6X4 STRL (CAST SUPPLIES) ×3 IMPLANT
PLATE 9H LT DIST ANTLAT TIB (Plate) ×2 IMPLANT
PLATE ACE 100DEG 6HOLE (Plate) ×1 IMPLANT
PLATE ANTLAT CNTR 156X9 (Plate) IMPLANT
PLATE LOCK 6H 77 BILAT FIB (Plate) ×1 IMPLANT
SCREW CORTICAL 2.7 MM 22MM (Screw) ×2 IMPLANT
SCREW CORTICAL 3.5MM  12MM (Screw) ×4 IMPLANT
SCREW CORTICAL 3.5MM  16MM (Screw) ×6 IMPLANT
SCREW CORTICAL 3.5MM  28MM (Screw) ×6 IMPLANT
SCREW CORTICAL 3.5MM 12MM (Screw) IMPLANT
SCREW CORTICAL 3.5MM 14MM (Screw) ×1 IMPLANT
SCREW CORTICAL 3.5MM 16MM (Screw) IMPLANT
SCREW CORTICAL 3.5MM 28MM (Screw) IMPLANT
SCREW LOCK CORT STAR 3.5X10 (Screw) ×1 IMPLANT
SCREW LOCK CORT STAR 3.5X12 (Screw) ×1 IMPLANT
SCREW LOCK CORT STAR 3.5X24 (Screw) ×1 IMPLANT
SCREW LOCK CORT STAR 3.5X38 (Screw) ×1 IMPLANT
SCREW LOCK CORT STAR 3.5X40 (Screw) ×2 IMPLANT
SCREW LP NL T15 3.5X22 (Screw) ×2 IMPLANT
SCREW T15 LP CORT 3.5X42MM NS (Screw) ×1 IMPLANT
SLEEVE DRILL 2.7 DIST TIB (TRAUMA) IMPLANT
SPONGE LAP 18X18 RF (DISPOSABLE) ×1 IMPLANT
STAPLER VISISTAT 35W (STAPLE) ×1 IMPLANT
SUCTION FRAZIER HANDLE 10FR (MISCELLANEOUS) ×2
SUCTION TUBE FRAZIER 10FR DISP (MISCELLANEOUS) ×1 IMPLANT
SUT ETHILON 3 0 PS 1 (SUTURE) ×5 IMPLANT
SUT MNCRL AB 3-0 PS2 18 (SUTURE) ×1 IMPLANT
SUT MON AB 2-0 CT1 36 (SUTURE) ×1 IMPLANT
SUT PROLENE 0 CT (SUTURE) IMPLANT
SUT VIC AB 0 CT1 27 (SUTURE) ×2
SUT VIC AB 0 CT1 27XBRD ANBCTR (SUTURE) ×1 IMPLANT
SUT VIC AB 2-0 CT1 27 (SUTURE) ×4
SUT VIC AB 2-0 CT1 TAPERPNT 27 (SUTURE) ×2 IMPLANT
SYR CONTROL 10ML LL (SYRINGE) ×1 IMPLANT
TOWEL GREEN STERILE (TOWEL DISPOSABLE) ×3 IMPLANT
TOWEL GREEN STERILE FF (TOWEL DISPOSABLE) ×3 IMPLANT
UNDERPAD 30X36 HEAVY ABSORB (UNDERPADS AND DIAPERS) ×2 IMPLANT
WATER STERILE IRR 1000ML POUR (IV SOLUTION) ×3 IMPLANT

## 2021-01-08 NOTE — Plan of Care (Signed)
  Problem: Activity: Goal: Risk for activity intolerance will decrease Outcome: Progressing   Problem: Coping: Goal: Level of anxiety will decrease Outcome: Progressing   Problem: Pain Managment: Goal: General experience of comfort will improve Outcome: Progressing   Problem: Safety: Goal: Ability to remain free from injury will improve Outcome: Progressing   Problem: Skin Integrity: Goal: Risk for impaired skin integrity will decrease Outcome: Progressing   

## 2021-01-08 NOTE — Op Note (Signed)
Orthopaedic Surgery Operative Note (CSN: [id number] ) Date of Surgery: 01/08/2021  Admit Date: 01/02/2021   Diagnoses: Pre-Op Diagnoses: Left closed pilon fracture (tibia and fibula)  Post-Op Diagnosis: Same  Procedures: 1. CPT 27828-Open reduction internal fixation of left pilon fracture (tibia and fibula) 2. CPT 27758-Open reduction internal fixation of left tibial shaft fracture 3. CPT 20694-Removal external fixation left ankle  Surgeons : Primary: Shona Needles, MD  Assistant: Patrecia Pace, PA-C  Location: OR 3   Anesthesia:General and regional   Antibiotics: Ancef 2g preop with 1 gm vancomycin powder and 1.2 gm tobramycin powder placed topically   Tourniquet time: Total Tourniquet Time Documented: Thigh (Left) - 124 minutes Total: Thigh (Left) - 124 minutes  Estimated Blood NWGN:56 mL  Complications:None   SpecimensNone   Implants: Implant Name Type Inv. Item Serial No. Manufacturer Lot No. LRB No. Used Action  PLATE ACE [id number] 6HOLE - [id number] Plate PLATE ACE [id number] 6HOLE  ZIMMER [employer]ON(ORTH,TRAU,BIO,SG) ON TRAY Left 1 Implanted  SCREW CORTICAL 3.5MM  12MM - [id number] Screw SCREW CORTICAL 3.5MM  12MM  ZIMMER [employer]ON(ORTH,TRAU,BIO,SG) ON TRAY Left 2 Implanted  SCREW CORTICAL 3.5MM  16MM - [id number] Screw SCREW CORTICAL 3.5MM  16MM  ZIMMER [employer]ON(ORTH,TRAU,BIO,SG) ON TRAY Left 3 Implanted  SCREW CORTICAL 3.5MM 14MM - [id number] Screw SCREW CORTICAL 3.5MM 14MM  ZIMMER [employer]ON(ORTH,TRAU,BIO,SG) ON TRAY Left 1 Implanted  SCREW CORTICAL 2.7 MM 22MM - [id number] Screw SCREW CORTICAL 2.7 MM 22MM  ZIMMER [employer]ON(ORTH,TRAU,BIO,SG) ON TRAY Left 2 Implanted  PLATE LOCK 6H 77 BILAT FIB - [id number] Plate PLATE LOCK 6H 77 BILAT FIB  ZIMMER [employer]ON(ORTH,TRAU,BIO,SG) ON TRAY Left 1 Implanted  PLATE 9H LT DIST ANTLAT TIB - [id number] Plate PLATE 9H LT DIST ANTLAT TIB  ZIMMER [employer]ON(ORTH,TRAU,BIO,SG) ON TRAY Left 1 Implanted  SCREW LP NL T15 3.5X22 - [id number] Screw SCREW LP NL T15 3.5X22  ZIMMER  RECON(ORTH,TRAU,BIO,SG) ON TRAY Left 2 Implanted  SCREW T15 LP CORT 3.5X42MM NS - [id number] Screw SCREW T15 LP CORT 3.5X42MM NS  ZIMMER RECON(ORTH,TRAU,BIO,SG) ON TRAY Left 1 Implanted  SCREW CORTICAL 3.5MM  28MM - [id number] Screw SCREW CORTICAL 3.5MM  28MM  ZIMMER [employer]ON(ORTH,TRAU,BIO,SG) ON TRAY Left 3 Implanted  SCREW LOCK CORT STAR 3.5X12 - [id number] Screw SCREW LOCK CORT STAR 3.5X12  ZIMMER [employer]ON(ORTH,TRAU,BIO,SG) ON TRAY Left 1 Implanted  SCREW LOCK CORT STAR 3.5X38 - [id number] Screw SCREW LOCK CORT STAR 3.5X38  ZIMMER RECON(ORTH,TRAU,BIO,SG) ON TRAY Left 1 Implanted  SCREW LOCK CORT STAR 3.5X40 - [id number] Screw SCREW LOCK CORT STAR 3.5X40  ZIMMER RECON(ORTH,TRAU,BIO,SG) ON TRAY Left 2 Implanted  SCREW LOCK CORT STAR 3.5X10 - [id number] Screw SCREW LOCK CORT STAR 3.5X10  ZIMMER RECON(ORTH,TRAU,BIO,SG) ON TRAY Left 1 Implanted  SCREW LOCK CORT STAR 3.5X24 - [id number] Screw SCREW LOCK CORT STAR 3.5X24  ZIMMER RECON(ORTH,TRAU,BIO,SG) ON TRAY Left 1 Implanted     Indications for Surgery: 50 year old female who sustained an intra-articular distal tibia fracture and associated fibula fracture.  She underwent external fixation on 01/03/2021.  Her swelling returned to a normal level and I felt that we could proceed with open reduction internal fixation.  Risks and benefits were discussed with the patient.  Risks include but not limited to bleeding, infection, malunion, nonunion, hardware failure, hardware irritation, nerve or blood vessel injury, posttraumatic arthritis, ankle stiffness, need for further surgery including potential fusion and total ankle arthroplasty, DVT, even the possibility anesthetic complications.  The patient agreed to proceed with surgery and consent was obtained.  Operative Findings: 1.  Highly comminuted intra-articular  left distal tibia fracture and fibular shaft fracture treated with open reduction internal fixation using Zimmer [employer] ALPS anterior lateral tibial plate and  composite 3.5 mm fibular plate for a medial tibial buttress. 2.  Open reduction internal fixation of tibial shaft fracture using independent 2.7 mm lag screws for tibial shaft extension of the pilon 3.  Open reduction internal fixation of left fibular shaft fracture using 6-hole Zimmer [employer] one third tubular plate 4.  Removal of spanning ankle external fixation  Procedure: The patient was identified in the preoperative holding area. Consent was confirmed with the patient and their family and all questions were answered. The operative extremity was marked after confirmation with the patient. she was then brought back to the operating room by our anesthesia colleagues.  She was placed under general anesthetic and carefully transferred over to a radiolucent flat top table.  A nonsterile tourniquet was placed to the upper thigh.  The patient was positioned in the semilateral position with an axillary roll placed under her neurovascular structures.  A beanbag was used to position the patient appropriately.  The left lower extremity was then prepped and draped in usual sterile fashion.  The external fixator was prepped into the field to use as a reduction aid.  A timeout was performed to verify the patient, the procedure, and the extremity.  Preoperative antibiotics were dosed.  From my preoperative planning CT scan I felt that a dual incision approach would be most appropriate.  I felt that a posterior lateral and anterior medial would allow adequate reduction and visualization of the fracture.  The tourniquet was inflated to 300 mmHg.  Total tourniquet time as noted above.  I started out with a posterior lateral incision.  And carried it down through skin subcutaneous tissue.  Identified branches to superficial peroneal nerve and protected this throughout the case.  The interval between the peroneals and FHL was developed.  There is significant trauma to the FHL from the fracture itself.  There was a  significant blowout of the posterior cortex with a metaphyseal bone that was in the soft tissues.  This was removed and saved on the back table to use for bone grafting of the metaphysis.  The fibular fracture was then exposed.  I cleaned out the hematoma performed a anatomic reduction using a reduction clamp and placed a posterior 6-hole one third tubular plate.  I was able to get fixation of the fibula and I was able to restore the length of the fibula.  Once I had the fibula fixed I tried to see if I can buttress the posterior lateral fragment to the tibial shaft.  However the extension into the tibial shaft made this very difficult.  Also there was significant comminution that prevented a good that I was able to obtain through the metaphysis.  The only one was a posterior medial fragment assisted with a reduction..  As result I undid the external fixator and proceeded to make an anterior medial incision.  I carried this through skin and subcutaneous tissue.  I was just medial to the retinaculum of the anterior tibialis tendon sheath.  I perform subperiosteal dissection to expose the metaphysis, medial fragment and anterior lateral fragment.  I was able to access the intra-articular involvement through this approach through the split in the anterior medial and anterior lateral bony fragments.  Here I encountered significant comminution.  The joint was then a number of pieces.  There were 3 main fragments.  Unfortunately I did  not have a good articular read with any of the fragments.  I also did not have a stable base to reconstruct the joint to.  I first started out by reducing and fixing the extension of the tibial shaft.  I was able to visualize the anterior lateral extension of the tibial shaft and used a reduction tenaculum to anatomically reduce this.  I then used a 2.7 mm lag screws to provisionally hold this to be able to remove my clamps.  After I fixed the tibial shaft there was not a good cortical  read through the metaphysis.As result I felt that providing a medial buttress for the metaphysis and placing a medial plate would allow the medial malleolus to provide a base to build the remainder of the joint.  Once I had the medial malleolus provisionally reduced I then contoured a composite Zimmer [employer] ALPS plate and placed a nonlocking screw into the tibial shaft.  I confirmed positioning of the plate with fluoroscopy.  I then placed a locking screw into the medial malleolus fragment to provisionally hold the reduction.  I reinforced this with placement of a medial bar to the external fixation.  From the medial malleolus I was then able to reduce the posterior lateral fragment in place and held this provisionally with a K wire from anterior to posterior.  I then was able to reduce the three central fragments and reconstructed the joint with the talus as a guide.  I held this provisionally with 1.1 mm K wires.  Once I had the posterior joint reduced and provisionally held I then reduced the anterior lateral fragment and confirmed reduction with fluoroscopy.  I held this provisionally with 1.6 mm K wire.  I confirmed near anatomic reduction with fluoroscopy.  I then used a Zimmer [employer] anterior lateral plate and slid this submuscularly along the anterior lateral cortex of the tibia.  I positioned it appropriately distally and held it provisionally with a K wire.  I then percutaneously placed a hemostat to I reduce the proximal portion of the plate.  I then placed a nonlocking screw in the distal fragment to fix the joint.  I then percutaneously placed a 3.5 millimeter screw into the tibial shaft to bring the plate proximally to bone.  I returned to the articular block and proceeded to place locking screws to hold and raft the joint.  I was then able to remove the K wires that were in place.  I then proceeded to place 2 more nonlocking screw through percutaneous incisions in the tibial shaft.  I returned to  the medial plate and pinned placed another nonlocking screw into the tibial shaft and placed 2 more locking screws into the medial malleolus fragment.  The external fixator was removed.  Final fluoroscopic imaging was obtained.  The incisions were copiously irrigated.  The cancellous bone that was removed from the posterior lateral incision was placed back into the metaphyseal defect.  A gram of vancomycin powder and 1.2 g of tobramycin powder were placed into the incisions.  Layered closure of 0 Vicryl for the periosteum and 2-0 Vicryl and 3-0 nylon was used for the skin.  Sterile dressing was applied.  A well-padded short leg splint was placed.    Post Op Plan/Instructions: Patient will be nonweightbearing to the left lower extremity.  She will receive postoperative Ancef.  She will continue to receive Lovenox for DVT prophylaxis.  We will plan to keep her in a splint for 2 weeks and then  have her return for x-rays and wound check.  We will have her continue to mobilize with physical and Occupational Therapy.  I was present and performed the entire surgery.  Patrecia Pace, PA-C did assist me throughout the case. An assistant was necessary given the difficulty in approach, maintenance of reduction and ability to instrument the fracture.   Katha Hamming, MD [facility]

## 2021-01-08 NOTE — Interval H&P Note (Signed)
History and Physical Interval Note:  01/08/2021 10:03 AM  Joanna Wright  has presented today for surgery, with the diagnosis of Left pilon fracture.  The various methods of treatment have been discussed with the patient and family. After consideration of risks, benefits and other options for treatment, the patient has consented to  Procedure(s): OPEN REDUCTION INTERNAL FIXATION (ORIF) PILON FRACTURE (Left) REMOVAL EXTERNAL FIXATION LEG (Left) as a surgical intervention.  The patient's history has been reviewed, patient examined, no change in status, stable for surgery.  I have reviewed the patient's chart and labs.  Questions were answered to the patient's satisfaction.     Lennette Bihari P Erine Phenix
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
## 2021-01-08 NOTE — Anesthesia Procedure Notes (Signed)
Anesthesia Regional Block: Popliteal block   Pre-Anesthetic Checklist: ,, timeout performed, Correct Patient, Correct Site, Correct Laterality, Correct Procedure, Correct Position, site marked, Risks and benefits discussed,  Surgical consent,  Pre-op evaluation,  At surgeon's request and post-op pain management  Laterality: Left  Prep: chloraprep       Needles:  Injection technique: Single-shot  Needle Type: Echogenic Needle     Needle Length: 9cm  Needle Gauge: 21     Additional Needles:   Procedures:,,,, ultrasound used (permanent image in chart),,,,  Narrative:  Start time: 01/08/2021 10:10 AM End time: 01/08/2021 10:15 AM Injection made incrementally with aspirations every 5 mL.  Performed by: Personally  Anesthesiologist: Suzette Battiest, MD

## 2021-01-08 NOTE — Anesthesia Procedure Notes (Signed)
Procedure Name: Intubation Date/Time: 01/08/2021 12:30 PM Performed by: Thelma Comp, CRNA Pre-anesthesia Checklist: Patient identified, Emergency Drugs available, Suction available and Patient being monitored Patient Re-evaluated:Patient Re-evaluated prior to induction Oxygen Delivery Method: Circle System Utilized Preoxygenation: Pre-oxygenation with 100% oxygen Induction Type: IV induction Ventilation: Mask ventilation without difficulty Laryngoscope Size: Mac and 3 Grade View: Grade I Tube type: Oral Number of attempts: 1 Airway Equipment and Method: Stylet Placement Confirmation: ETT inserted through vocal cords under direct vision,  positive ETCO2 and breath sounds checked- equal and bilateral Secured at: 22 cm Tube secured with: Tape Dental Injury: Teeth and Oropharynx as per pre-operative assessment
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
## 2021-01-08 NOTE — Anesthesia Postprocedure Evaluation (Signed)
Anesthesia Post Note  Patient: GERILYN STARGELL  Procedure(s) Performed: OPEN REDUCTION INTERNAL FIXATION (ORIF) PILON FRACTURE (Left Leg Lower) REMOVAL EXTERNAL FIXATION LEG (Left Leg Lower)     Patient location during evaluation: PACU Anesthesia Type: General Level of consciousness: sedated Pain management: pain level controlled Vital Signs Assessment: post-procedure vital signs reviewed and stable Respiratory status: spontaneous breathing and respiratory function stable Cardiovascular status: stable Postop Assessment: no apparent nausea or vomiting Anesthetic complications: no   No complications documented.  Last Vitals:  Vitals:   01/08/21 1527 01/08/21 1539  BP: (!) 143/82   Pulse: (!) 101 (!) 101  Resp: 15 15  Temp:  36.7 C  SpO2: 99% 100%    Last Pain:  Vitals:   01/08/21 1527  TempSrc:   PainSc: Asleep                 Bellarose Burtt DANIEL

## 2021-01-08 NOTE — Progress Notes (Signed)
Orthopedic Tech Progress Note Patient Details:  Joanna Wright 04-11-1971 [id number] Spoke with PA about removing PLASTER splint patient had on wrist and apply a VELCRO splint.[employer] Devices Type of Ortho Device: Velcro wrist forearm splint Finger Trap Weight: 5 lbs Splint Material: Fiberglass Ortho Device/Splint Location: RUE Ortho Device/Splint Interventions: Application,Adjustment   Post Interventions Patient Tolerated: Well Instructions Provided: Care of device   Janit Pagan 01/08/2021, 3:05 PM

## 2021-01-08 NOTE — Anesthesia Procedure Notes (Signed)
Anesthesia Regional Block: Adductor canal block   Pre-Anesthetic Checklist: ,, timeout performed, Correct Patient, Correct Site, Correct Laterality, Correct Procedure, Correct Position, site marked, Risks and benefits discussed,  Surgical consent,  Pre-op evaluation,  At surgeon's request and post-op pain management  Laterality: Left  Prep: chloraprep       Needles:  Injection technique: Single-shot  Needle Type: Echogenic Needle     Needle Length: 9cm  Needle Gauge: 21     Additional Needles:   Procedures:,,,, ultrasound used (permanent image in chart),,,,  Narrative:  Start time: 01/08/2021 10:15 AM End time: 01/08/2021 10:21 AM Injection made incrementally with aspirations every 5 mL.  Performed by: Personally  Anesthesiologist: Suzette Battiest, MD

## 2021-01-08 NOTE — Anesthesia Preprocedure Evaluation (Signed)
Anesthesia Evaluation  Patient identified by MRN, date of birth, ID band Patient awake    Reviewed: Allergy & Precautions, NPO status , Patient's Chart, lab work & pertinent test results  Airway Mallampati: II  TM Distance: >3 FB Neck ROM: Full    Dental  (+) Dental Advisory Given   Pulmonary neg pulmonary ROS,    breath sounds clear to auscultation       Cardiovascular hypertension, Pt. on medications  Rhythm:Regular Rate:Normal     Neuro/Psych negative neurological ROS     GI/Hepatic negative GI ROS, Neg liver ROS,   Endo/Other  negative endocrine ROS  Renal/GU negative Renal ROS     Musculoskeletal   Abdominal   Peds  Hematology  (+) anemia ,   Anesthesia Other Findings   Reproductive/Obstetrics                             Lab Results  Component Value Date   WBC 7.8 01/06/2021   HGB 10.2 (L) 01/06/2021   HCT 30.5 (L) 01/06/2021   MCV 95.9 01/06/2021   PLT 311 01/06/2021   Lab Results  Component Value Date   CREATININE 0.74 01/06/2021   BUN 5 (L) 01/06/2021   NA 137 01/06/2021   K 3.9 01/06/2021   CL 102 01/06/2021   CO2 26 01/06/2021    Anesthesia Physical Anesthesia Plan  ASA: II  Anesthesia Plan: General   Post-op Pain Management:  Regional for Post-op pain   Induction: Intravenous  PONV Risk Score and Plan: 3 and Dexamethasone, Ondansetron, Midazolam and Treatment may vary due to age or medical condition  Airway Management Planned: Oral ETT  Additional Equipment:   Intra-op Plan:   Post-operative Plan: Extubation in OR  Informed Consent: I have reviewed the patients History and Physical, chart, labs and discussed the procedure including the risks, benefits and alternatives for the proposed anesthesia with the patient or authorized representative who has indicated his/her understanding and acceptance.     Dental advisory given  Plan Discussed with:    Anesthesia Plan Comments:         Anesthesia Quick Evaluation
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
## 2021-01-08 NOTE — Progress Notes (Signed)
PT Cancellation Note  Patient Details Name: Joanna Wright MRN: [id number] DOB: [birth year]   Cancelled Treatment:    Reason Eval/Treat Not Completed: Patient at procedure or test/unavailable.  Pt left for procedure this AM and is still unavailable.  Retry at another time.   Ramond Dial 01/08/2021, 12:35 PM  Mee Hives, PT MS Acute Rehab Dept. Number: [place]

## 2021-01-08 NOTE — Transfer of Care (Signed)
Immediate Anesthesia Transfer of Care Note  Patient: Joanna Wright  Procedure(s) Performed: OPEN REDUCTION INTERNAL FIXATION (ORIF) PILON FRACTURE (Left Leg Lower) REMOVAL EXTERNAL FIXATION LEG (Left Leg Lower)  Patient Location: PACU  Anesthesia Type:General  Level of Consciousness: awake and patient cooperative  Airway & Oxygen Therapy: Patient Spontanous Breathing  Post-op Assessment: Report given to RN and Post -op Vital signs reviewed and stable  Post vital signs: Reviewed and stable  Last Vitals:  Vitals Value Taken Time  BP 151/86 01/08/21 1444  Temp 36.7 C 01/08/21 1442  Pulse 91 01/08/21 1453  Resp 13 01/08/21 1453  SpO2 98 % 01/08/21 1453  Vitals shown include unvalidated device data.  Last Pain:  Vitals:   01/08/21 1442  TempSrc:   PainSc: 0-No pain      Patients Stated Pain Goal: 2 (24/82/50 0370)  Complications: No complications documented.

## 2021-01-09 LAB — CBC
HCT: 32.9 % — ABNORMAL LOW (ref 36.0–46.0)
Hemoglobin: 11.1 g/dL — ABNORMAL LOW (ref 12.0–15.0)
MCH: 31.9 pg (ref 26.0–34.0)
MCHC: 33.7 g/dL (ref 30.0–36.0)
MCV: 94.5 fL (ref 80.0–100.0)
Platelets: 356 10*3/uL (ref 150–400)
RBC: 3.48 MIL/uL — ABNORMAL LOW (ref 3.87–5.11)
RDW: 13.1 % (ref 11.5–15.5)
WBC: 9.1 10*3/uL (ref 4.0–10.5)
nRBC: 0 % (ref 0.0–0.2)

## 2021-01-09 LAB — BASIC METABOLIC PANEL
Anion gap: 8 (ref 5–15)
BUN: 8 mg/dL (ref 6–20)
CO2: 25 mmol/L (ref 22–32)
Calcium: 8.4 mg/dL — ABNORMAL LOW (ref 8.9–10.3)
Chloride: 103 mmol/L (ref 98–111)
Creatinine, Ser: 0.86 mg/dL (ref 0.44–1.00)
GFR, Estimated: >60 mL/min (ref >60)
Glucose, Bld: 121 mg/dL — ABNORMAL HIGH (ref 70–99)
Potassium: 4 mmol/L (ref 3.5–5.1)
Sodium: 136 mmol/L (ref 135–145)

## 2021-01-09 MED ORDER — METHOCARBAMOL 1000 MG/10ML IJ SOLN
750.0000 mg | Freq: Four times a day (QID) | INTRAVENOUS | Status: DC
  Filled 2021-01-09: qty 7.5

## 2021-01-09 MED ORDER — METHOCARBAMOL 500 MG PO TABS: 750.0000 mg | ORAL_TABLET | Freq: Four times a day (QID) | ORAL | Status: DC

## 2021-01-09 MED ORDER — KETOROLAC TROMETHAMINE 15 MG/ML IJ SOLN
15.0000 mg | Freq: Four times a day (QID) | INTRAMUSCULAR | Status: DC
  Filled 2021-01-09: qty 1

## 2021-01-09 MED ORDER — KETOROLAC TROMETHAMINE 15 MG/ML IJ SOLN
15.0000 mg | Freq: Four times a day (QID) | INTRAMUSCULAR | Status: AC
  Administered 2021-01-09 – 2021-01-10 (×5): 15 mg via INTRAVENOUS
  Filled 2021-01-09 (×4): qty 1

## 2021-01-09 MED ORDER — GABAPENTIN 100 MG PO CAPS
100.0000 mg | ORAL_CAPSULE | Freq: Three times a day (TID) | ORAL | Status: DC
  Administered 2021-01-09 – 2021-01-11 (×8): 100 mg via ORAL
  Filled 2021-01-09 (×8): qty 1

## 2021-01-09 MED ORDER — HYDROMORPHONE HCL 1 MG/ML IJ SOLN: 1.0000 mg | Freq: Once | INTRAMUSCULAR | Status: DC

## 2021-01-09 MED ORDER — AMLODIPINE BESYLATE 10 MG PO TABS
10.0000 mg | ORAL_TABLET | Freq: Every day | ORAL | Status: DC
  Administered 2021-01-09 – 2021-01-11 (×3): 10 mg via ORAL
  Filled 2021-01-09 (×3): qty 1

## 2021-01-09 MED ORDER — METHOCARBAMOL 500 MG PO TABS
1000.0000 mg | ORAL_TABLET | Freq: Four times a day (QID) | ORAL | Status: DC
  Administered 2021-01-09 – 2021-01-11 (×11): 1000 mg via ORAL
  Filled 2021-01-09 (×11): qty 2

## 2021-01-09 NOTE — Plan of Care (Signed)
  Problem: Activity: Goal: Risk for activity intolerance will decrease Outcome: Progressing   Problem: Coping: Goal: Level of anxiety will decrease Outcome: Progressing   Problem: Pain Managment: Goal: General experience of comfort will improve Outcome: Progressing   Problem: Safety: Goal: Ability to remain free from injury will improve Outcome: Progressing   Problem: Skin Integrity: Goal: Risk for impaired skin integrity will decrease Outcome: Progressing   
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
## 2021-01-09 NOTE — Progress Notes (Signed)
Notified Dr Dema Severin pt c/o pain 14/10. states throbbing  anteriorly from from knee to foot. good CMS to foot, moves toes, toes warm. Leg/foot elevated, attempted ice to foot x 2 ice bags. Patient could not tolerate ice and stated it made pain worse. PRN meds given as ordered. RN will continue to monitor.

## 2021-01-09 NOTE — Progress Notes (Signed)
Inpatient Rehab Admissions Coordinator:   Met with pt at bedside to confirm plan to open insurance today.  She reports pain is a little better, but still very high, especially with movement.  Provided emotional support.  She is agreeable to start insurance auth process today, which I will do.   Shann Medal, PT, DPT Admissions Coordinator [phone] 01/09/21  12:27 PM
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
## 2021-01-10 LAB — CBC
HCT: 29.9 % — ABNORMAL LOW (ref 36.0–46.0)
Hemoglobin: 9.8 g/dL — ABNORMAL LOW (ref 12.0–15.0)
MCH: 32.6 pg (ref 26.0–34.0)
MCHC: 32.8 g/dL (ref 30.0–36.0)
MCV: 99.3 fL (ref 80.0–100.0)
Platelets: 320 10*3/uL (ref 150–400)
RBC: 3.01 MIL/uL — ABNORMAL LOW (ref 3.87–5.11)
RDW: 13.2 % (ref 11.5–15.5)
WBC: 7.4 10*3/uL (ref 4.0–10.5)
nRBC: 0 % (ref 0.0–0.2)

## 2021-01-10 LAB — BASIC METABOLIC PANEL
Anion gap: 8 (ref 5–15)
BUN: 7 mg/dL (ref 6–20)
CO2: 24 mmol/L (ref 22–32)
Calcium: 8.2 mg/dL — ABNORMAL LOW (ref 8.9–10.3)
Chloride: 104 mmol/L (ref 98–111)
Creatinine, Ser: 0.77 mg/dL (ref 0.44–1.00)
GFR, Estimated: >60 mL/min (ref >60)
Glucose, Bld: 98 mg/dL (ref 70–99)
Potassium: 3.4 mmol/L — ABNORMAL LOW (ref 3.5–5.1)
Sodium: 136 mmol/L (ref 135–145)

## 2021-01-10 MED ORDER — BISACODYL 10 MG RE SUPP: 10.0000 mg | Freq: Every day | RECTAL | Status: DC | PRN
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
## 2021-01-10 NOTE — Plan of Care (Signed)
  Problem: Education: Goal: Knowledge of General Education information will improve Description: Including pain rating scale, medication(s)/side effects and non-pharmacologic comfort measures Outcome: Progressing   Problem: Health Behavior/Discharge Planning: Goal: Ability to manage health-related needs will improve Outcome: Progressing   Problem: Clinical Measurements: Goal: Ability to maintain clinical measurements within normal limits will improve Outcome: Progressing   Problem: Activity: Goal: Risk for activity intolerance will decrease Outcome: Progressing   Problem: Elimination: Goal: Will not experience complications related to bowel motility Outcome: Progressing   Problem: Pain Managment: Goal: General experience of comfort will improve Outcome: Progressing   Problem: Safety: Goal: Ability to remain free from injury will improve Outcome: Progressing   Problem: Skin Integrity: Goal: Risk for impaired skin integrity will decrease Outcome: Progressing
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
## 2021-01-10 NOTE — Progress Notes (Signed)
Inpatient Rehab Admissions Coordinator:   I have insurance authorization for pt to admit to CIR, but I do not have a bed available for her today.  I let her know at bedside.  She reports pain control is adequate.  Hopeful for admission tomorrow, pending bed availability.   Shann Medal, PT, DPT Admissions Coordinator [phone] 01/10/21  10:51 AM
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
## 2021-01-11 ENCOUNTER — Other Ambulatory Visit: Payer: Self-pay

## 2021-01-11 ENCOUNTER — Encounter (HOSPITAL_COMMUNITY): Payer: Self-pay | Admitting: Student

## 2021-01-11 ENCOUNTER — Inpatient Hospital Stay (HOSPITAL_COMMUNITY)
Admission: RE | Admit: 2021-01-11 | Discharge: 2021-01-20 | DRG: 560 | Disposition: A | Discharge status: Home / Self Care | Payer: PRIVATE HEALTH INSURANCE | Source: Intra-hospital | Attending: Physical Medicine and Rehabilitation | Admitting: Physical Medicine and Rehabilitation

## 2021-01-11 DIAGNOSIS — E669 Obesity, unspecified: Secondary | ICD-10-CM | POA: Diagnosis present

## 2021-01-11 DIAGNOSIS — D75839 Thrombocytosis, unspecified: Secondary | ICD-10-CM

## 2021-01-11 DIAGNOSIS — E8809 Other disorders of plasma-protein metabolism, not elsewhere classified: Secondary | ICD-10-CM | POA: Diagnosis present

## 2021-01-11 DIAGNOSIS — S82875S Nondisplaced pilon fracture of left tibia, sequela: Secondary | ICD-10-CM | POA: Diagnosis not present

## 2021-01-11 DIAGNOSIS — E559 Vitamin D deficiency, unspecified: Secondary | ICD-10-CM | POA: Diagnosis present

## 2021-01-11 DIAGNOSIS — S62300D Unspecified fracture of second metacarpal bone, right hand, subsequent encounter for fracture with routine healing: Secondary | ICD-10-CM | POA: Diagnosis not present

## 2021-01-11 DIAGNOSIS — S82401A Unspecified fracture of shaft of right fibula, initial encounter for closed fracture: Secondary | ICD-10-CM

## 2021-01-11 DIAGNOSIS — R079 Chest pain, unspecified: Secondary | ICD-10-CM

## 2021-01-11 DIAGNOSIS — Z8249 Family history of ischemic heart disease and other diseases of the circulatory system: Secondary | ICD-10-CM | POA: Diagnosis not present

## 2021-01-11 DIAGNOSIS — S52571D Other intraarticular fracture of lower end of right radius, subsequent encounter for closed fracture with routine healing: Secondary | ICD-10-CM | POA: Diagnosis not present

## 2021-01-11 DIAGNOSIS — Z79899 Other long term (current) drug therapy: Secondary | ICD-10-CM | POA: Diagnosis not present

## 2021-01-11 DIAGNOSIS — Z713 Dietary counseling and surveillance: Secondary | ICD-10-CM

## 2021-01-11 DIAGNOSIS — Z6834 Body mass index (BMI) 34.0-34.9, adult: Secondary | ICD-10-CM | POA: Diagnosis not present

## 2021-01-11 DIAGNOSIS — D62 Acute posthemorrhagic anemia: Secondary | ICD-10-CM | POA: Diagnosis present

## 2021-01-11 DIAGNOSIS — S82872D Displaced pilon fracture of left tibia, subsequent encounter for closed fracture with routine healing: Secondary | ICD-10-CM | POA: Diagnosis present

## 2021-01-11 DIAGNOSIS — I10 Essential (primary) hypertension: Secondary | ICD-10-CM | POA: Diagnosis present

## 2021-01-11 DIAGNOSIS — S82302A Unspecified fracture of lower end of left tibia, initial encounter for closed fracture: Secondary | ICD-10-CM | POA: Diagnosis present

## 2021-01-11 DIAGNOSIS — S82492D Other fracture of shaft of left fibula, subsequent encounter for closed fracture with routine healing: Secondary | ICD-10-CM | POA: Diagnosis not present

## 2021-01-11 DIAGNOSIS — K5901 Slow transit constipation: Secondary | ICD-10-CM | POA: Diagnosis present

## 2021-01-11 DIAGNOSIS — G8918 Other acute postprocedural pain: Secondary | ICD-10-CM

## 2021-01-11 DIAGNOSIS — S82209S Unspecified fracture of shaft of unspecified tibia, sequela: Secondary | ICD-10-CM

## 2021-01-11 DIAGNOSIS — S82409S Unspecified fracture of shaft of unspecified fibula, sequela: Secondary | ICD-10-CM

## 2021-01-11 DIAGNOSIS — R739 Hyperglycemia, unspecified: Secondary | ICD-10-CM | POA: Diagnosis present

## 2021-01-11 DIAGNOSIS — S82875D Nondisplaced pilon fracture of left tibia, subsequent encounter for closed fracture with routine healing: Secondary | ICD-10-CM | POA: Diagnosis not present

## 2021-01-11 DIAGNOSIS — M7989 Other specified soft tissue disorders: Secondary | ICD-10-CM | POA: Diagnosis not present

## 2021-01-11 DIAGNOSIS — S82201A Unspecified fracture of shaft of right tibia, initial encounter for closed fracture: Secondary | ICD-10-CM

## 2021-01-11 DIAGNOSIS — R0789 Other chest pain: Secondary | ICD-10-CM | POA: Diagnosis present

## 2021-01-11 DIAGNOSIS — S82302S Unspecified fracture of lower end of left tibia, sequela: Secondary | ICD-10-CM | POA: Diagnosis not present

## 2021-01-11 LAB — BASIC METABOLIC PANEL
Anion gap: 7 (ref 5–15)
BUN: 8 mg/dL (ref 6–20)
CO2: 26 mmol/L (ref 22–32)
Calcium: 8.4 mg/dL — ABNORMAL LOW (ref 8.9–10.3)
Chloride: 103 mmol/L (ref 98–111)
Creatinine, Ser: 0.74 mg/dL (ref 0.44–1.00)
GFR, Estimated: >60 mL/min (ref >60)
Glucose, Bld: 106 mg/dL — ABNORMAL HIGH (ref 70–99)
Potassium: 3.7 mmol/L (ref 3.5–5.1)
Sodium: 136 mmol/L (ref 135–145)

## 2021-01-11 LAB — CREATININE, SERUM
Creatinine, Ser: 0.87 mg/dL (ref 0.44–1.00)
GFR, Estimated: >60 mL/min (ref >60)

## 2021-01-11 LAB — CBC
HCT: 30.1 % — ABNORMAL LOW (ref 36.0–46.0)
Hemoglobin: 9.9 g/dL — ABNORMAL LOW (ref 12.0–15.0)
MCH: 32.1 pg (ref 26.0–34.0)
MCHC: 32.9 g/dL (ref 30.0–36.0)
MCV: 97.7 fL (ref 80.0–100.0)
Platelets: 395 10*3/uL (ref 150–400)
RBC: 3.08 MIL/uL — ABNORMAL LOW (ref 3.87–5.11)
RDW: 13.2 % (ref 11.5–15.5)
WBC: 6 10*3/uL (ref 4.0–10.5)
nRBC: 0 % (ref 0.0–0.2)

## 2021-01-11 MED ORDER — ONDANSETRON HCL 4 MG PO TABS: 4.0000 mg | ORAL_TABLET | Freq: Four times a day (QID) | ORAL | Status: DC | PRN

## 2021-01-11 MED ORDER — OXYCODONE HCL 5 MG PO TABS
5.0000 mg | ORAL_TABLET | ORAL | Status: DC | PRN
  Administered 2021-01-11 – 2021-01-19 (×15): 10 mg via ORAL
  Filled 2021-01-11 (×15): qty 2

## 2021-01-11 MED ORDER — VITAMIN D (ERGOCALCIFEROL) 1.25 MG (50000 UNIT) PO CAPS
50000.0000 [IU] | ORAL_CAPSULE | ORAL | Status: DC
  Administered 2021-01-18: 50000 [IU] via ORAL
  Filled 2021-01-11 (×3): qty 1

## 2021-01-11 MED ORDER — ENOXAPARIN SODIUM 30 MG/0.3ML ~~LOC~~ SOLN
30.0000 mg | Freq: Two times a day (BID) | SUBCUTANEOUS | Status: DC
  Administered 2021-01-11 – 2021-01-16 (×10): 30 mg via SUBCUTANEOUS
  Filled 2021-01-11 (×10): qty 0.3

## 2021-01-11 MED ORDER — ENOXAPARIN SODIUM 30 MG/0.3ML ~~LOC~~ SOLN: 30.0000 mg | Freq: Two times a day (BID) | SUBCUTANEOUS | Status: DC

## 2021-01-11 MED ORDER — GABAPENTIN 100 MG PO CAPS
100.0000 mg | ORAL_CAPSULE | Freq: Three times a day (TID) | ORAL | Status: DC
  Administered 2021-01-12 – 2021-01-20 (×25): 100 mg via ORAL
  Filled 2021-01-11 (×25): qty 1

## 2021-01-11 MED ORDER — AMLODIPINE BESYLATE 10 MG PO TABS
10.0000 mg | ORAL_TABLET | Freq: Every day | ORAL | Status: DC
  Administered 2021-01-12 – 2021-01-20 (×9): 10 mg via ORAL
  Filled 2021-01-11 (×9): qty 1

## 2021-01-11 MED ORDER — METHOCARBAMOL 500 MG PO TABS
1000.0000 mg | ORAL_TABLET | Freq: Four times a day (QID) | ORAL | Status: DC
  Administered 2021-01-12 – 2021-01-20 (×33): 1000 mg via ORAL
  Filled 2021-01-11 (×35): qty 2

## 2021-01-11 MED ORDER — BISACODYL 10 MG RE SUPP: 10.0000 mg | Freq: Every day | RECTAL | Status: DC | PRN

## 2021-01-11 MED ORDER — POLYETHYLENE GLYCOL 3350 17 G PO PACK
17.0000 g | PACK | Freq: Every day | ORAL | Status: DC
  Administered 2021-01-12 – 2021-01-18 (×7): 17 g via ORAL
  Filled 2021-01-11 (×9): qty 1

## 2021-01-11 MED ORDER — ONDANSETRON HCL 4 MG/2ML IJ SOLN: 4.0000 mg | Freq: Four times a day (QID) | INTRAMUSCULAR | Status: DC | PRN

## 2021-01-11 MED ORDER — DOCUSATE SODIUM 100 MG PO CAPS
100.0000 mg | ORAL_CAPSULE | Freq: Two times a day (BID) | ORAL | Status: DC
  Administered 2021-01-11 – 2021-01-19 (×16): 100 mg via ORAL
  Filled 2021-01-11 (×18): qty 1

## 2021-01-11 MED ORDER — ACETAMINOPHEN 325 MG PO TABS
325.0000 mg | ORAL_TABLET | ORAL | Status: DC | PRN
  Administered 2021-01-19: 650 mg via ORAL
  Filled 2021-01-11: qty 2
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
## 2021-01-11 NOTE — Progress Notes (Signed)
Report given to Ed, Pt to transfer to 5C02. Pt not in distress, Pt updated regarding the transfer, family at bedside.
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
## 2021-01-11 NOTE — Progress Notes (Signed)
Transferred to [facility] 02, pt still alert oriented, not in distress on room air, transferred via wheelchair with all personal belongings, pt aware
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
## 2021-01-11 NOTE — Progress Notes (Signed)
Inpatient Rehabilitation Medication Review by a Pharmacist  A complete drug regimen review was completed for this patient to identify any potential clinically significant medication issues.  Clinically significant medication issues were identified:  No  Check AMION for pharmacist assigned to patient if future medication questions/issues arise during this admission.  Time spent performing this drug regimen review (minutes):  [place], PharmD, BCPS, [facility] Clinical Pharmacist 01/11/2021 10:26 PM

## 2021-01-11 NOTE — Plan of Care (Signed)

## 2021-01-12 ENCOUNTER — Inpatient Hospital Stay (HOSPITAL_COMMUNITY): Payer: PRIVATE HEALTH INSURANCE

## 2021-01-12 DIAGNOSIS — M7989 Other specified soft tissue disorders: Secondary | ICD-10-CM

## 2021-01-12 DIAGNOSIS — S82302S Unspecified fracture of lower end of left tibia, sequela: Secondary | ICD-10-CM

## 2021-01-12 LAB — COMPREHENSIVE METABOLIC PANEL
ALT: 18 U/L (ref 0–44)
AST: 23 U/L (ref 15–41)
Albumin: 3 g/dL — ABNORMAL LOW (ref 3.5–5.0)
Alkaline Phosphatase: 56 U/L (ref 38–126)
Anion gap: 10 (ref 5–15)
BUN: 7 mg/dL (ref 6–20)
CO2: 24 mmol/L (ref 22–32)
Calcium: 8.9 mg/dL (ref 8.9–10.3)
Chloride: 102 mmol/L (ref 98–111)
Creatinine, Ser: 0.78 mg/dL (ref 0.44–1.00)
GFR, Estimated: >60 mL/min (ref >60)
Glucose, Bld: 126 mg/dL — ABNORMAL HIGH (ref 70–99)
Potassium: 3.7 mmol/L (ref 3.5–5.1)
Sodium: 136 mmol/L (ref 135–145)
Total Bilirubin: 0.5 mg/dL (ref 0.3–1.2)
Total Protein: 6.7 g/dL (ref 6.5–8.1)

## 2021-01-12 LAB — CBC WITH DIFFERENTIAL/PLATELET
Abs Immature Granulocytes: 0.05 10*3/uL (ref 0.00–0.07)
Basophils Absolute: 0 10*3/uL (ref 0.0–0.1)
Basophils Relative: 0 %
Eosinophils Absolute: 0.1 10*3/uL (ref 0.0–0.5)
Eosinophils Relative: 2 %
HCT: 31.2 % — ABNORMAL LOW (ref 36.0–46.0)
Hemoglobin: 10.3 g/dL — ABNORMAL LOW (ref 12.0–15.0)
Immature Granulocytes: 1 %
Lymphocytes Relative: 21 %
Lymphs Abs: 1.3 10*3/uL (ref 0.7–4.0)
MCH: 31.7 pg (ref 26.0–34.0)
MCHC: 33 g/dL (ref 30.0–36.0)
MCV: 96 fL (ref 80.0–100.0)
Monocytes Absolute: 0.5 10*3/uL (ref 0.1–1.0)
Monocytes Relative: 9 %
Neutro Abs: 4 10*3/uL (ref 1.7–7.7)
Neutrophils Relative %: 67 %
Platelets: 445 10*3/uL — ABNORMAL HIGH (ref 150–400)
RBC: 3.25 MIL/uL — ABNORMAL LOW (ref 3.87–5.11)
RDW: 12.9 % (ref 11.5–15.5)
WBC: 5.9 10*3/uL (ref 4.0–10.5)
nRBC: 0 % (ref 0.0–0.2)

## 2021-01-12 LAB — TROPONIN I (HIGH SENSITIVITY): Troponin I (High Sensitivity): 4 ng/L (ref <18)

## 2021-01-12 LAB — D-DIMER, QUANTITATIVE: D-Dimer, Quant: 2.25 ug/mL-FEU — ABNORMAL HIGH (ref 0.00–0.50)

## 2021-01-12 MED ORDER — MELATONIN 3 MG PO TABS
3.0000 mg | ORAL_TABLET | Freq: Every day | ORAL | Status: DC
  Administered 2021-01-12 – 2021-01-19 (×7): 3 mg via ORAL
  Filled 2021-01-12 (×7): qty 1

## 2021-01-12 MED ORDER — IOHEXOL 350 MG/ML SOLN
70.0000 mL | Freq: Once | INTRAVENOUS | Status: AC | PRN
  Administered 2021-01-12: 70 mL via INTRAVENOUS

## 2021-01-12 NOTE — Progress Notes (Addendum)
Pt transferred to 5C02 from [place] around 2010. Pt is A&Ox4.  Pt is Oriented to Safety and call light. Charting and vitals back timed to time of arrival on unit. Pt is in no distress will complete assessment and continue to monitor patient.
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
## 2021-01-12 NOTE — Plan of Care (Signed)
  Problem: RH Balance Goal: LTG Patient will maintain dynamic standing balance (PT) Description: LTG:  Patient will maintain dynamic standing balance with assistance during mobility activities (PT) Flowsheets (Taken 01/12/2021 1720) LTG: Pt will maintain dynamic standing balance during mobility activities with:: Supervision/Verbal cueing   Problem: Sit to Stand Goal: LTG:  Patient will perform sit to stand with assistance level (PT) Description: LTG:  Patient will perform sit to stand with assistance level (PT) Flowsheets (Taken 01/12/2021 1720) LTG: PT will perform sit to stand in preparation for functional mobility with assistance level: Independent with assistive device   Problem: RH Bed Mobility Goal: LTG Patient will perform bed mobility with assist (PT) Description: LTG: Patient will perform bed mobility with assistance, with/without cues (PT). Flowsheets (Taken 01/12/2021 1720) LTG: Pt will perform bed mobility with assistance level of: Independent with assistive device    Problem: RH Bed to Chair Transfers Goal: LTG Patient will perform bed/chair transfers w/assist (PT) Description: LTG: Patient will perform bed to chair transfers with assistance (PT). Flowsheets (Taken 01/12/2021 1720) LTG: Pt will perform Bed to Chair Transfers with assistance level: Supervision/Verbal cueing   Problem: RH Car Transfers Goal: LTG Patient will perform car transfers with assist (PT) Description: LTG: Patient will perform car transfers with assistance (PT). Flowsheets (Taken 01/12/2021 1720) LTG: Pt will perform car transfers with assist:: Supervision/Verbal cueing   Problem: RH Ambulation Goal: LTG Patient will ambulate in controlled environment (PT) Description: LTG: Patient will ambulate in a controlled environment, # of feet with assistance (PT). Flowsheets (Taken 01/12/2021 1720) LTG: Pt will ambulate in controlled environ  assist needed:: Supervision/Verbal cueing LTG: Ambulation distance in  controlled environment: 150 Goal: LTG Patient will ambulate in home environment (PT) Description: LTG: Patient will ambulate in home environment, # of feet with assistance (PT). Flowsheets (Taken 01/12/2021 1720) LTG: Pt will ambulate in home environ  assist needed:: Supervision/Verbal cueing LTG: Ambulation distance in home environment: at least 50 feet with LRAD   Problem: RH Wheelchair Mobility Goal: LTG Patient will propel w/c in controlled environment (PT) Description: LTG: Patient will propel wheelchair in controlled environment, # of feet with assist (PT) Flowsheets (Taken 01/12/2021 1720) LTG: Pt will propel w/c in controlled environ  assist needed:: Independent with assistive device LTG: Propel w/c distance in controlled environment: 100 ft Goal: LTG Patient will propel w/c in home environment (PT) Description: LTG: Patient will propel wheelchair in home environment, # of feet with assistance (PT). Flowsheets (Taken 01/12/2021 1720) LTG: Pt will propel w/c in home environ  assist needed:: Independent with assistive device Distance: wheelchair distance in controlled environment: 100 LTG: Propel w/c distance in home environment: at least 50 feet with at least two 90 degree turns   Problem: RH Stairs Goal: LTG Patient will ambulate up and down stairs w/assist (PT) Description: LTG: Patient will ambulate up and down # of stairs with assistance (PT) Flowsheets (Taken 01/12/2021 1720) LTG: Pt will ambulate up/down stairs assist needed:: Supervision/Verbal cueing LTG: Pt will  ambulate up and down number of stairs: at least 8 steps using HR setup as per home environment

## 2021-01-12 NOTE — Progress Notes (Signed)
Notified Dan PA about patients lab results, directed to call attending physician. Left voicemail for attending physician to call back.

## 2021-01-12 NOTE — Progress Notes (Signed)
Physical Therapy Session Note  Patient Details  Name: Joanna Wright MRN: [id number] Date of Birth: 11/12/1970  Today's Date: 01/12/2021 PT Individual Time: 1545-1600 PT Individual Time Calculation (min): 15 min   Short Term Goals: Week 1:  PT Short Term Goal 1 (Week 1): STG = LTG d/t ELOS  Skilled Therapeutic Interventions/Progress Updates:   Pt received supine in bed and agreeable to PT. Supine>sit transfer with supervision  assist and cues NWB RUE with transition to sitting. Pt tnoted to have no ELR. PT retrieved ELR and returned to Room. RN then present to report that transport coming to pict pt up to perform scans to rule out PT. Therapy held at this time. Pt returned to supine with supervision assist and cues for management of the LLE. Pt left supine in bed with call bell in reach and all needs met.    Therapy Documentation Precautions:  Precautions Precautions: Fall Precaution Comments: Soft cast on L lower leg, R wrist with brace. Required Braces or Orthoses: Splint/Cast Splint/Cast: R wrist cock- up; LLE Soft Cast Restrictions Weight Bearing Restrictions: Yes RUE Weight Bearing: Weight bear through elbow only LLE Weight Bearing: Non weight bearing Other Position/Activity Restrictions: Pt NWB on R wrist/ hand, but is allowed to WB through elbow General: PT Amount of Missed Time (min): 45 Minutes (potential PE) PT Missed Treatment Reason: MD hold (Comment)   Pain: Pain Assessment Pain Scale: 0-10 Pain Score: 4  Pain Type: Acute pain;Surgical pain Pain Location: Wrist Pain Orientation: Left Pain Descriptors / Indicators: Throbbing Patients Stated Pain Goal: 2 Pain Intervention(s): Elevated extremity;Repositioned Multiple Pain Sites: Yes 2nd Pain Site Pain Score: 3 Pain Location: Ankle Pain Orientation: Left Pain Descriptors / Indicators: Aching;Throbbing Patient's Stated Pain Goal: 2 Pain Intervention(s): Repositioned;Elevated extremity M  Therapy/Group:  Individual Therapy  Lorie Phenix 01/12/2021, 7:35 PM

## 2021-01-12 NOTE — Progress Notes (Signed)
Inpatient Rehabilitation Center Individual Statement of Services  Patient Name:  KHRYSTINA BONNES  Date:  01/12/2021  Welcome to the [facility].  Our goal is to provide you with an individualized program based on your diagnosis and situation, designed to meet your specific needs.  With this comprehensive rehabilitation program, you will be expected to participate in at least 3 hours of rehabilitation therapies Monday-Friday, with modified therapy programming on the weekends.  Your rehabilitation program will include the following services:  Physical Therapy (PT), Occupational Therapy (OT), Speech Therapy (ST), 24 hour per day rehabilitation nursing, Therapeutic Recreaction (TR), Neuropsychology, Care Coordinator, Rehabilitation Medicine, Nutrition Services, Pharmacy Services and Other  Weekly team conferences will be held on Tuesdays to discuss your progress.  Your Inpatient Rehabilitation Care Coordinator will talk with you frequently to get your input and to update you on team discussions.  Team conferences with you and your family in attendance may also be held.  Expected length of stay: 10-14 Days  Overall anticipated outcome: MOD I  Depending on your progress and recovery, your program may change. Your Inpatient Rehabilitation Care Coordinator will coordinate services and will keep you informed of any changes. Your Inpatient Rehabilitation Care Coordinator's name and contact numbers are listed  below.  The following services may also be recommended but are not provided by the [facility]:    [place] will be made to provide these services after discharge if needed.  Arrangements include referral to agencies that provide these services.  Your insurance has been verified to be:  Risk analyst Your primary doctor is:  [name], NP  Pertinent information will be  shared with your doctor and your insurance company.  Inpatient Rehabilitation Care Coordinator:  Erlene Quan, [place] or [phone]  Information discussed with and copy given to patient by: Dyanne Iha, 01/12/2021, 11:51 AM

## 2021-01-12 NOTE — Progress Notes (Signed)
Bilateral lower extremity venous duplex completed. Refer to "CV Proc" under chart review to view preliminary results.  01/12/2021 3:27 PM Kelby Aline., MHA, RVT, RDCS, RDMS

## 2021-01-12 NOTE — Progress Notes (Signed)
Inpatient Rehabilitation  Patient information reviewed and entered into eRehab system by Maurice Fotheringham M. Deshon Hsiao, M.A., CCC/SLP, PPS Coordinator.  Information including medical coding, functional ability and quality indicators will be reviewed and updated through discharge.    
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
## 2021-01-12 NOTE — Plan of Care (Signed)
  Problem: RH Balance Goal: LTG: Patient will maintain dynamic sitting balance (OT) Description: LTG:  Patient will maintain dynamic sitting balance with assistance during activities of daily living (OT) Flowsheets (Taken 01/12/2021 1252) LTG: Pt will maintain dynamic sitting balance during ADLs with: Independent with assistive device Goal: LTG Patient will maintain dynamic standing with ADLs (OT) Description: LTG:  Patient will maintain dynamic standing balance with assist during activities of daily living (OT)  Flowsheets (Taken 01/12/2021 1252) LTG: Pt will maintain dynamic standing balance during ADLs with: Independent with assistive device   Problem: Sit to Stand Goal: LTG:  Patient will perform sit to stand in prep for activites of daily living with assistance level (OT) Description: LTG:  Patient will perform sit to stand in prep for activites of daily living with assistance level (OT) Flowsheets (Taken 01/12/2021 1252) LTG: PT will perform sit to stand in prep for activites of daily living with assistance level: Independent with assistive device   Problem: RH Eating Goal: LTG Patient will perform eating w/assist, cues/equip (OT) Description: LTG: Patient will perform eating with assist, with/without cues using equipment (OT) Flowsheets (Taken 01/12/2021 1252) LTG: Pt will perform eating with assistance level of: Independent   Problem: RH Grooming Goal: LTG Patient will perform grooming w/assist,cues/equip (OT) Description: LTG: Patient will perform grooming with assist, with/without cues using equipment (OT) Flowsheets (Taken 01/12/2021 1252) LTG: Pt will perform grooming with assistance level of: Independent with assistive device    Problem: RH Bathing Goal: LTG Patient will bathe all body parts with assist levels (OT) Description: LTG: Patient will bathe all body parts with assist levels (OT) Flowsheets (Taken 01/12/2021 1252) LTG: Pt will perform bathing with assistance  level/cueing: Independent with assistive device    Problem: RH Dressing Goal: LTG Patient will perform upper body dressing (OT) Description: LTG Patient will perform upper body dressing with assist, with/without cues (OT). Flowsheets (Taken 01/12/2021 1252) LTG: Pt will perform upper body dressing with assistance level of: Independent Goal: LTG Patient will perform lower body dressing w/assist (OT) Description: LTG: Patient will perform lower body dressing with assist, with/without cues in positioning using equipment (OT) Flowsheets (Taken 01/12/2021 1252) LTG: Pt will perform lower body dressing with assistance level of: Independent with assistive device   Problem: RH Toileting Goal: LTG Patient will perform toileting task (3/3 steps) with assistance level (OT) Description: LTG: Patient will perform toileting task (3/3 steps) with assistance level (OT)  Flowsheets (Taken 01/12/2021 1252) LTG: Pt will perform toileting task (3/3 steps) with assistance level: Independent with assistive device   Problem: RH Light Housekeeping Goal: LTG Patient will perform light housekeeping w/assist (OT) Description: LTG: Patient will perform light housekeeping with assistance, with/without cues (OT). Flowsheets (Taken 01/12/2021 1252) LTG: Pt will perform light housekeeping with assistance level of: Independent with assistive device   Problem: RH Toilet Transfers Goal: LTG Patient will perform toilet transfers w/assist (OT) Description: LTG: Patient will perform toilet transfers with assist, with/without cues using equipment (OT) Flowsheets (Taken 01/12/2021 1252) LTG: Pt will perform toilet transfers with assistance level of: Independent with assistive device   Problem: RH Furniture Transfers Goal: LTG Patient will perform furniture transfers w/assist (OT/PT) Description: LTG: Patient will perform furniture transfers  with assistance (OT/PT). Flowsheets (Taken 01/12/2021 1252) LTG: Pt will perform  furniture transfers with assist:: Independent with assistive device
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
## 2021-01-12 NOTE — Progress Notes (Signed)
Inpatient Rehabilitation Care Coordinator Assessment and Plan Patient Details  Name: Joanna Wright MRN: [id number] Date of Birth: [birth year]  Today's Date: 01/12/2021  Hospital Problems: Principal Problem:   Closed fracture of left distal tibia  Past Medical History:  Past Medical History:  Diagnosis Date  . DUB (dysfunctional uterine bleeding)    Hx  . MHA (microangiopathic hemolytic anemia) (HCC)    Hx  . Night sweats    Hx  . Psoriasis of scalp    Past Surgical History:  Past Surgical History:  Procedure Laterality Date  . ACHILLES TENDON REPAIR Right 2010  . BREAST BIOPSY    . BREAST EXCISIONAL BIOPSY    . CERVICAL POLYPECTOMY N/A 05/19/2014   Procedure: CERVICAL POLYPECTOMY;  Surgeon: Sharene Butters, MD;  Location: [facility];  Service: Gynecology;  Laterality: N/A;  . EXTERNAL FIXATION LEG Left 01/03/2021   Procedure: EXTERNAL FIXATION ANKLE LEFT and right distal radius fracture ORIF;  Surgeon: Shona Needles, MD;  Location: [place];  Service: Orthopedics;  Laterality: Left;  . EXTERNAL FIXATION REMOVAL Left 01/08/2021   Procedure: REMOVAL EXTERNAL FIXATION LEG;  Surgeon: Shona Needles, MD;  Location: [facility];  Service: Orthopedics;  Laterality: Left;  . HYSTEROSCOPY N/A 05/19/2014   Procedure: HYSTEROSCOPY;  Surgeon: Sharene Butters, MD;  Location: [facility];  Service: Gynecology;  Laterality: N/A;  . OPEN REDUCTION INTERNAL FIXATION (ORIF) TIBIA/FIBULA FRACTURE Left 01/08/2021   Procedure: OPEN REDUCTION INTERNAL FIXATION (ORIF) PILON FRACTURE;  Surgeon: Shona Needles, MD;  Location: [place];  Service: Orthopedics;  Laterality: Left;  . WISDOM TOOTH EXTRACTION     Social History:  reports that she has never smoked. She has never used smokeless tobacco. She reports current alcohol use. She reports that she does not use drugs.  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 Return to Work Plans: yes Public relations account executive Issues: n/a Guardian/Conservator: n/a   Abuse/Neglect Abuse/Neglect Assessment Can Be Completed: Yes Physical Abuse: Denies Verbal Abuse: Denies Sexual Abuse: Denies Exploitation of patient/patient's resources: Denies Self-Neglect: Denies  Emotional Status Pt's affect, behavior and adjustment status: no pt plesant Recent Psychosocial Issues: n/a Psychiatric History: n/a Substance Abuse History: n/a  Patient / Family Perceptions, Expectations & Goals Pt/Family understanding of illness & functional limitations: yes Premorbid pt/family roles/activities: Fully independent, working, driving Anticipated changes in roles/activities/participation: Spouse able to asssit with tasks and meals Pt/family expectations/goals: MOD I  Recruitment consultant: None Premorbid Home Care/DME Agencies: None Transportation available at discharge: Spouse able to transport  Discharge Planning Living Arrangements: Spouse/significant other [place]: Spouse/significant other Type of Residence: Private residence (1 level home. 7/8 steps to enter) Insurance Resources: Forensic psychologist) Financial Resources: Employment Museum/gallery curator Screen Referred: No Living Expenses: Medical laboratory scientific officer Management: Patient Does the patient have any problems obtaining your medications?: No Home Management: Independent Patient/Family Preliminary Plans: Spouse able to assist with medications Care Coordinator Barriers to Discharge: Other (comments) Care Coordinator Barriers to Discharge Comments: [place] Follow Up due to [place] Coordinator  Anticipated Follow Up Needs: HH/OP Expected length of stay: 10-14 Days  Clinical Impression SW met with pt introduced self, explained role and addressed questions  and concerns. Pt very pleasant, finishing up PT session. No questions at the moment SW will con tot follow up.    Dyanne Iha 01/12/2021, 1:20 PM

## 2021-01-13 DIAGNOSIS — I10 Essential (primary) hypertension: Secondary | ICD-10-CM

## 2021-01-13 DIAGNOSIS — S82875S Nondisplaced pilon fracture of left tibia, sequela: Secondary | ICD-10-CM

## 2021-01-13 DIAGNOSIS — R0789 Other chest pain: Secondary | ICD-10-CM

## 2021-01-13 NOTE — Progress Notes (Signed)
Physical Therapy Session Note  Patient Details  Name: Joanna Wright MRN: [id number] Date of Birth: [birth year]  Today's Date: 01/13/2021 PT Individual Time:  0800-0900 PT Individual Time Calculation (min): 60 min   Short Term Goals: Week 1:  PT Short Term Goal 1 (Week 1): STG = LTG d/t ELOS  Skilled Therapeutic Interventions/Progress Updates:  Patient long sitting in bed upon PT arrival. Pt's husband in room. Patient alert and agreeable to PT session. Patient relates 6/ 10 pain in R wrist > LLE. Has had pain medication just prior to session with expectations to feel 4/ 10 pain.   Therapeutic Activity: Bed Mobility: Patient performed supine <> sit with supervision. Good balance demonstrated with reach for clothing items and able to don sock to R foot with L hand. Can don shoe but requires Max A to tie laces.  Transfers: Patient performed all STS transfers this session with supervision and vc for safe push-to-stand from w/c and for safe stand with L hand placed on PFW.  SPVT transfers initially performed with CGA and progresses to supervision with good balance at PFW. Provided verbal cues for technique to decrease torque to knee.   Gait Training:  Patient ambulated 150' x1/ 100' x1/ 44' x1using PFW with CGA throughout. Provided verbal cues for for softening landing during hop, and not to continue if pt starts to feel fatigue. Family education with husband re: positioning of w/c behind pt for fatigue and for hand placement to pt's R hip to guard balance and potential guidance of pt back into w/c with any LOB.   Step negotiation training with husband re: use of modified shower stool for assist up each 6" step. Demonstrated sequencing and assist required from husband. Pt is able to complete with supervision of STS at each step and only assist with advancement of stool at each step. VC to pt for proper sequencing of foot placement intermittently throughout.   Wheelchair Mobility:  Patient  propelled wheelchair 50 feet with supervision. Provided verbal cues for propel vs steering with improvement in maintaining straight path.  Patient seated on EOB at end of session with brakes locked, bed alarm set, husband present, and all needs within reach. RN and NT informed of pt positioning.    Therapy Documentation Precautions:  Precautions Precautions: Fall Precaution Comments: Soft cast on L lower leg, R wrist with brace. Required Braces or Orthoses: Splint/Cast Splint/Cast: R wrist cock- up; LLE Soft Cast Restrictions Weight Bearing Restrictions: Yes RUE Weight Bearing: Weight bear through elbow only LLE Weight Bearing: Non weight bearing Other Position/Activity Restrictions: Pt NWB on R wrist/ hand, but is allowed to WB through elbow Pain: Pain Assessment Pain Scale: 0-10 Pain Score: 6  Pain Type: Acute pain Pain Location: Wrist Pain Orientation: Right Pain Descriptors / Indicators: Aching Pain Frequency: Intermittent Pain Onset: Gradual Patients Stated Pain Goal: 2 Pain Intervention(s): Medication (See eMAR)   Therapy/Group: Individual Therapy  Alger Simons PT, DPT 01/13/2021, 8:03 AM
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
## 2021-01-13 NOTE — Plan of Care (Signed)
  Problem: RH BOWEL ELIMINATION Goal: RH STG MANAGE BOWEL WITH ASSISTANCE Description: STG Manage Bowel with Mod I Assistance. Outcome: Not Progressing Goal: RH STG MANAGE BOWEL W/MEDICATION W/ASSISTANCE Description: STG Manage Bowel with Medication with Mod I Assistance. Outcome: Not Progressing

## 2021-01-13 NOTE — Progress Notes (Signed)
Occupational Therapy Session Note  Patient Details  Name: Joanna Wright MRN: [id number] Date of Birth: [birth year]  Today's Date: 01/13/2021 OT Individual Time: 3354-5625 OT Individual Time Calculation (min): 73 min + 43 min   Short Term Goals: Week 1:  OT Short Term Goal 1 (Week 1): STG = LTG 2/2 ELOS  Skilled Therapeutic Interventions/Progress Updates:    Session 1 [phone]): Pt received semi-reclined in bed, agreeable to therapy. Session focus on energy conservation, functional mobility, activity tolerance, dynamic standing balance in prep for community/home reintegration and improved ADL/IADL performance. Donned R shoe set-up bed level, req A to tie/doff 2/2 fatigue. Came to sitting EOB with use of bed features and close S. Stand-pivot throughout session with PFRW and CGA for balance. Self-propelled w/c with hemi-technique throughout session with close S and good navigation of hallway obstacles. Req min A to navigate tight corners and going up inclines. Additionally educated on w/c part management and provided tubing for extended R brake, although will need to be readjusted in future sessions. Functional mobility in w/c throughout hospital atrium, gift shop, and on uneven surfaces outside with overall good tolerance/navigation of obstacles. Improved affect after going outside noted, as well. In ADL apartment, completed 1 TTB transfer after demonstration with PFRW and CGA for balance, discussed where to purchase and energy conservation techniques for bathing when she is medically cleared to shower. Discussed kitchen navigation as well, rec pt remain in w/c at home kitchen to remove items from fridge, husband to assist in setting up all frequently used objects within her reach, as well. Finally, stood at [facility] to complete 2 min of single target game with unilateral UE support. C/o  R wrist pain at end of session, RN aware. Stand-pivot back to bed same manner as above and returned to supine close  S.  Pt left semi-reclined in bed with RN present, bed alarm engaged, call bell in reach, and all immediate needs met.    Session 2 [phone]): Pt received in w/c with NT exiting bathroom, agreeable to therapy. Cont session focus on community reintegration, functional mobility/transfers, activity tolerance, and BUE/RLE strengthening. Pt self-propelled w/c to and from gym with close S, increased ind with L leg rest management this session. Self-propelled w/c through hospital atrium and able to utilize ATM with min VCs to lock w/c brakes, and amb within [employer] to order/pay for food with overall CGA + PFRW. In gym, completed 2x10 of chest openers, shoulder press, arm circles, and STS with no AD. 2 Lb weights placed on B biceps. Stand-pivot transfer back to bed CGA + PFRW. Doffed R shoe mod I. C/o of minimal R wrist pain, ceases with rest.  Pt left long-sitting in bed awaiting RN with bed alarm engaged, call bell in reach, and all immediate needs met.    Therapy Documentation Precautions:  Precautions Precautions: Fall Precaution Comments: Soft cast on L lower leg, R wrist with brace. Required Braces or Orthoses: Splint/Cast Splint/Cast: R wrist cock- up; LLE Soft Cast Restrictions Weight Bearing Restrictions: Yes RUE Weight Bearing: Weight bear through elbow only LLE Weight Bearing: Non weight bearing Other Position/Activity Restrictions: Pt NWB on R wrist/ hand, but is allowed to WB through elbow Pain: See session notes ADL: See Care Tool for more details.   Therapy/Group: Individual Therapy  Volanda Napoleon MS, OTR/L   01/13/2021, 6:52 AM

## 2021-01-13 NOTE — Plan of Care (Signed)
  Problem: Consults Goal: RH GENERAL PATIENT EDUCATION Description: See Patient Education module for education specifics. Outcome: Progressing   Problem: RH BOWEL ELIMINATION Goal: RH STG MANAGE BOWEL WITH ASSISTANCE Description: STG Manage Bowel with Mod I Assistance. Outcome: Progressing   Problem: RH SKIN INTEGRITY Goal: RH STG MAINTAIN SKIN INTEGRITY WITH ASSISTANCE Description: STG Maintain Skin Integrity With Mod I Assistance. Outcome: Progressing   Problem: RH SAFETY Goal: RH STG ADHERE TO SAFETY PRECAUTIONS W/ASSISTANCE/DEVICE Description: STG Adhere to Safety Precautions With Mod I Assistance/Device. Outcome: Progressing Goal: RH STG DECREASED RISK OF FALL WITH ASSISTANCE Description: STG Decreased Risk of Fall With Mod I Assistance. Outcome: Progressing   Problem: RH PAIN MANAGEMENT Goal: RH STG PAIN MANAGED AT OR BELOW PT'S PAIN GOAL Description: < 4 on a 0-10 pain scale. Outcome: Progressing   Problem: RH KNOWLEDGE DEFICIT GENERAL Goal: RH STG INCREASE KNOWLEDGE OF SELF CARE AFTER HOSPITALIZATION Description: Patient will be able to demonstrate knowledge of medication management, pain management, weight bearing precautions with educational materials and handout provided by staff, at discharge independently. Outcome: Progressing

## 2021-01-14 DIAGNOSIS — K5901 Slow transit constipation: Secondary | ICD-10-CM

## 2021-01-14 MED ORDER — FLEET ENEMA 7-19 GM/118ML RE ENEM: 1.0000 | ENEMA | Freq: Once | RECTAL | Status: DC

## 2021-01-14 MED ORDER — SORBITOL 70 % SOLN
60.0000 mL | Status: AC
  Administered 2021-01-14: 60 mL via ORAL
  Filled 2021-01-14: qty 60

## 2021-01-14 MED ORDER — FLEET ENEMA 7-19 GM/118ML RE ENEM
1.0000 | ENEMA | Freq: Once | RECTAL | Status: DC
  Filled 2021-01-14: qty 1
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
## 2021-01-15 LAB — CBC
HCT: 28.9 % — ABNORMAL LOW (ref 36.0–46.0)
Hemoglobin: 9.4 g/dL — ABNORMAL LOW (ref 12.0–15.0)
MCH: 32 pg (ref 26.0–34.0)
MCHC: 32.5 g/dL (ref 30.0–36.0)
MCV: 98.3 fL (ref 80.0–100.0)
Platelets: 441 10*3/uL — ABNORMAL HIGH (ref 150–400)
RBC: 2.94 MIL/uL — ABNORMAL LOW (ref 3.87–5.11)
RDW: 13.1 % (ref 11.5–15.5)
WBC: 5.1 10*3/uL (ref 4.0–10.5)
nRBC: 0 % (ref 0.0–0.2)

## 2021-01-15 LAB — HEMOGLOBIN A1C
Hgb A1c MFr Bld: 5.5 % (ref 4.8–5.6)
Mean Plasma Glucose: 111.15 mg/dL

## 2021-01-15 NOTE — Progress Notes (Signed)
Physical Therapy Session Note  Patient Details  Name: Joanna Wright MRN: [id number] Date of Birth: May 12, 1971  Today's Date: 01/15/2021 PT Individual Time: 1335-1400 PT Individual Time Calculation (min): 25 min   Short Term Goals: Week 1:  PT Short Term Goal 1 (Week 1): STG = LTG d/t ELOS Week 2:    Week 3:     Skilled Therapeutic Interventions/Progress Updates:    Pain:  Pt reports R wrist  Pain 4/10, denies pain in LE.  Treatment to tolerance.  Rest breaks and repositioning as needed.  Pt initially long sitting in bed, husband at bedside and agreeable to treatment session w/focus on strengthening.  Standing therex LLE only: Hamstring curls x 15 Marching x 15  Repeated Sit to stand 2x20 Standing forward to overhead repeated reach w/RUE support on platform x20 Standing diagonal reach low R to overhead L x 20 Side stepping 56ft x 2 Forward step/reverse step x 2  Pt left seated on edge of bed w/alarm set, needs in reach, husband at side.   Therapy Documentation Precautions:  Precautions Precautions: Fall Precaution Comments: Soft cast on L lower leg, R wrist with brace. Required Braces or Orthoses: Splint/Cast Splint/Cast: R wrist cock- up; LLE Soft Cast Restrictions Weight Bearing Restrictions: Yes RUE Weight Bearing: Weight bearing as tolerated LLE Weight Bearing: Non weight bearing Other Position/Activity Restrictions: Pt NWB on R wrist/ hand, but is allowed to WB through elbow    Therapy/Group: Individual Therapy  Callie Fielding, [place] 01/15/2021, 4:08 PM

## 2021-01-15 NOTE — Progress Notes (Signed)
Occupational Therapy Session Note  Patient Details  Name: Joanna Wright MRN: [id number] Date of Birth: 08-26-1971  Today's Date: 01/15/2021 OT Individual Time: 1130-1200 OT Individual Time Calculation (min): 30 min    Short Term Goals: Week 1:  OT Short Term Goal 1 (Week 1): STG = LTG 2/2 ELOS  Skilled Therapeutic Interventions/Progress Updates:    Treatment session with focus on functional mobility and d/c planning.  Pt received semi-reclined in bed agreeable to therapy session.  Pt completed bed mobility and stand pivot transfer to w/c with close supervision.  Pt propelled w/c 100' for BUE strengthening and endurance, therapist transported pt remainder of way outside for time management.  Engaged in d/c planning and discussion of home bathroom setup, as pt concerned about difficulty mobilizing in bathroom with [place].  Discussed tub/shower transfers with tub bench and plan for shower tomorrow.  Pt returned to room and transferred back to bed stand pivot CGA when transferring towards LLE.  Pt remained semi-reclined in bed with all needs in reach.  Therapy Documentation Precautions:  Precautions Precautions: Fall Precaution Comments: Soft cast on L lower leg, R wrist with brace. Required Braces or Orthoses: Splint/Cast Splint/Cast: R wrist cock- up; LLE Soft Cast Restrictions Weight Bearing Restrictions: Yes RUE Weight Bearing: Weight bearing as tolerated LLE Weight Bearing: Non weight bearing Other Position/Activity Restrictions: Pt NWB on R wrist/ hand, but is allowed to WB through elbow Pain: Pain Assessment Pain Score: 5    Therapy/Group: Individual Therapy  Simonne Come 01/15/2021, 12:19 PM
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
## 2021-01-15 NOTE — Progress Notes (Signed)
Occupational Therapy Session Note  Patient Details  Name: Joanna Wright MRN: [id number] Date of Birth: 09-19-1971  Today's Date: 01/15/2021 OT Individual Time: 5701-7793 OT Individual Time Calculation (min): 60 min    Short Term Goals: Week 1:  OT Short Term Goal 1 (Week 1): STG = LTG 2/2 ELOS  Skilled Therapeutic Interventions/Progress Updates:    Treatment session with focus on functional transfers, self-care tasks, and functional mobility.  Pt received upright in bed reporting discomfort in RUE but agreeable to therapy session.  Pt declined bathing and dressing this AM as she reports washing up last night.  Pt asking questions about when she will be able to shower.  Per ortho PA note from 4/20 pt able to remove wrist splint for shower.  Plan for shower with tub bench during next OT session. Pt completed stand pivot transfer bed > w/c with CGA while maintaining NWB through LLE.  Pt completed oral care seated at sink after setup and donned sock and shoe with setup, requiring assist to fasten shoe.  Engaged in [place] and strengthening with 2 sets of 10 arm circles, chest openers, shoulder press with pt reporting increased pain in RUE.  RN notified of pain, awaiting pain meds.  Pt propelled w/c 200' for UE strengthening and endurance.  Pt reports most likely will use PFRW and w/c at home.  Pt transferred back to bed stand pivot with min assist.  Pt left semi-reclined in bed with all needs in reach.  Therapy Documentation Precautions:  Precautions Precautions: Fall Precaution Comments: Soft cast on L lower leg, R wrist with brace. Required Braces or Orthoses: Splint/Cast Splint/Cast: R wrist cock- up; LLE Soft Cast Restrictions Weight Bearing Restrictions: Yes RUE Weight Bearing: Weight bearing as tolerated LLE Weight Bearing: Non weight bearing Other Position/Activity Restrictions: Pt NWB on R wrist/ hand, but is allowed to WB through elbow General:   Vital Signs: Therapy  Vitals Temp: 98.4 F (36.9 C) Temp Source: Oral Pulse Rate: 93 Resp: 18 BP: (!) 142/89 Patient Position (if appropriate): Lying Oxygen Therapy SpO2: 94 % O2 Device: Room Air Pain: Pain Assessment Pain Scale: 0-10 Pain Score: 8  Pain Type: Surgical pain Pain Location: Hand Pain Orientation: Right Pain Descriptors / Indicators: Throbbing Pain Frequency: Intermittent Patients Stated Pain Goal: 2 Pain Intervention(s): Medication (See eMAR)   Therapy/Group: Individual Therapy  Simonne Come 01/15/2021, 8:44 AM

## 2021-01-15 NOTE — Progress Notes (Signed)
Physical Therapy Session Note  Patient Details  Name: Joanna Wright MRN: [id number] Date of Birth: [birth year]  Today's Date: 01/15/2021 PT Individual Time: 0915-1008 PT Individual Time Calculation (min): 53 min   Short Term Goals: Week 1:  PT Short Term Goal 1 (Week 1): STG = LTG d/t ELOS  Skilled Therapeutic Interventions/Progress Updates:   Received pt supine in bed, pt agreeable to therapy, and reported pain 4/10 in R wrist (premedicated) but reported no pain at end of session. Session with emphasis on functional mobility/transfers, generalized strengthening, dynamic standing balance/coordination, ambulation, stair navigation, and improved activity tolerance. Pt performed bed mobility with supervision x 2 trials throughout session and performed all stand<>pivot transfers with PFRW and CGA throughout. Pt demonstrated good adherence to WB precautions throughout session. Pt transported to 61M therapy gym in [facility] total A for time management purposes and navigated 4 steps with L handrail and min A using shower chair. Pt with increased difficulty standing from floor level and required total A from therapist to move shower chair up/down steps due to increased wrist pain this morning. Discussed equipment for discharge home including 18x18 manual WC, PFRW, and shower chair for stair navigation. Pt reports a WC will not fit through bathroom and going through the bathroom door forwards is not a possibility. Educated pt on option for "side stepping" through doorway and pt stated this technique may work. Pt then ambulated 96ft with PFRW and CGA/supervision using a "hop to" pattern. Pt demonstrated difficulty with RLE foot clearance especially with increased fatigue and required cues for energy conservation and improved positioning of RW for optimal R foot clearance. Pt reported increased chest/rib pain after ambulation requiring seated rest break. Worked on dynamic standing balance playing cornhole using LUE and  supervision for balance x 2 trials using PFRW with no LOB noted. Pt transported back to room in [facility] total A and requested to return to bed. Concluded session with pt sitting in bed, needs within reach, and bed alarm on.   Therapy Documentation Precautions:  Precautions Precautions: Fall Precaution Comments: Soft cast on L lower leg, R wrist with brace. Required Braces or Orthoses: Splint/Cast Splint/Cast: R wrist cock- up; LLE Soft Cast Restrictions Weight Bearing Restrictions: Yes RUE Weight Bearing: Weight bearing as tolerated LLE Weight Bearing: Non weight bearing Other Position/Activity Restrictions: Pt NWB on R wrist/ hand, but is allowed to WB through elbow  Therapy/Group: Individual Therapy Alfonse Alpers PT, DPT   01/15/2021, 7:17 AM

## 2021-01-15 NOTE — Progress Notes (Signed)
Patient ID: Joanna Wright, female   DOB: Feb 05, 1971, 50 y.o.   MRN: [id number]   Pt provided with medical records contact information  Erlene Quan, [place]

## 2021-01-16 MED ORDER — APIXABAN 2.5 MG PO TABS
2.5000 mg | ORAL_TABLET | Freq: Two times a day (BID) | ORAL | Status: DC
  Administered 2021-01-16 – 2021-01-20 (×9): 2.5 mg via ORAL
  Filled 2021-01-16 (×9): qty 1

## 2021-01-16 MED ORDER — LIDOCAINE 5 % EX PTCH
1.0000 | MEDICATED_PATCH | CUTANEOUS | Status: DC
  Administered 2021-01-16 – 2021-01-19 (×4): 1 via TRANSDERMAL
  Filled 2021-01-16 (×5): qty 1

## 2021-01-16 NOTE — Progress Notes (Signed)
Physical Therapy Session Note  Patient Details  Name: MAHIKA VANVOORHIS MRN: [id number] Date of Birth: 1971/03/16  Today's Date: 01/16/2021 PT Individual Time: 1300-1355 PT Individual Time Calculation (min): 55 min   Short Term Goals: Week 1:  PT Short Term Goal 1 (Week 1): STG = LTG d/t ELOS  Skilled Therapeutic Interventions/Progress Updates:   Received pt sitting on commode, pt agreeable to therapy, and reported 4/10 pain in LLE (premedicated); pain improved throughout session. Session with emphasis on toileting, functional mobility/transfers, generalized strengthening, dynamic standing balance/coordination, ambulation, stair navigation, and improved activity tolerance. Pt performed WC mobility 134ft x 1 and 159ft x 1 using LUE and RLE with supervision to therapy gym. Pt navigated 9 steps with L handrail and CGA using shower chair technique. Pt required total A from therapist to move shower chair up/down steps and min cues for technique (specifically foot placement on step). 3 rest/water breaks taken at bottom of stairs due to fatigue. Pt transferred sit<>stand with PFRW and supervision and ambulated 41ft with PFRW and close supervision using a "hop to" pattern; stopped due to fatigue in RLE and noted pt with decreased R foot clearance. Pt performed the following exercises on LUE sitting in WC with supervision and verbal cues for technique: -bicep curls 2x15 with 7lb dumbbell and 1x15 with 4lb dumbbell -overhead shoulder press with 4lb dumbbell 1x10 and 1x10 with 7lb dumbbell  -hammer curls 2x10 with 7lb dumbbell Pt transported back to room in [facility] total A and transferred WC<>bed stand<>pivot with supervision while maintaining WB precautions. Concluded session with pt sitting EOB eating lunch, needs within reach, and bed alarm on.   Therapy Documentation Precautions:  Precautions Precautions: Fall Precaution Comments: Soft cast on L lower leg, R wrist with brace. Required Braces or Orthoses:  Splint/Cast Splint/Cast: R wrist cock- up; LLE Soft Cast Restrictions Weight Bearing Restrictions: Yes RUE Weight Bearing: Weight bearing as tolerated LLE Weight Bearing: Non weight bearing Other Position/Activity Restrictions: Pt NWB on R wrist/ hand, but is allowed to WB through elbow  Therapy/Group: Individual Therapy Alfonse Alpers PT, DPT   01/16/2021, 7:35 AM

## 2021-01-16 NOTE — Progress Notes (Signed)
Patient ID: Joanna Wright, female   DOB: [birth year], 50 y.o.   MRN: [id number]  WC, platform RW and shower seat ordered through [place], [place]
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
## 2021-01-16 NOTE — Progress Notes (Signed)
Spoke with orthopedic services in regards to transitioning patient from Lovenox to Eliquis for DVT prophylaxis for upcoming discharge.  Plan is to initiate Eliquis 5 mg twice daily x14 more days and stop.  Lovenox has now been discontinued.

## 2021-01-16 NOTE — Progress Notes (Signed)
Patient ID: Joanna Wright, female   DOB: 1971-09-17, 50 y.o.   MRN: [id number] Team Conference Report to Patient/Family  Team Conference discussion was reviewed with the patient and caregiver, including goals, any changes in plan of care and target discharge date.  Patient and caregiver express understanding and are in agreement.  The patient has a target discharge date of 01/20/21.  SW met wit pt, pt pleased with d/c date. Currently sleeping would like sw to return with updates  Dyanne Iha 01/16/2021, 1:33 PM
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
## 2021-01-16 NOTE — Progress Notes (Signed)
Physical Therapy Session Note  Patient Details  Name: Joanna Wright MRN: [id number] Date of Birth: 1971-05-06  Today's Date: 01/16/2021 PT Individual Time: 1033-1130 PT Individual Time Calculation (min): 57 min   Short Term Goals: Week 1:  PT Short Term Goal 1 (Week 1): STG = LTG d/t ELOS  Skilled Therapeutic Interventions/Progress Updates:    Patient in supine and reports hand throbbing more today.  States worked on steps with shower chair, agreeable to try.  First performed in bed therex as noted below. Supine to sit with S.  Patient transfer to w/c using PFRW and CGA.  Pushed in w/c to therapy gym.  Performed stair negotiation using shower chair with mod cues and CGA x 8 steps.  Patient ambulated with PFRW x 70' min A to CGA, but noted pt hopping and not stabilizing with UE's.  Lowered walker for height and demonstrated using UE's more and jumping less to lessen shock to spine, etc.  Patient able to perform intermittently after practice first 3 steps forward and 3 back, then lifting both feet to stabilize with UE's.  Then pt ambulated x 40' with mod cues for technique, stopping walker when stepping and for safety with CGA.  Patient assisted in w/c to room and performed stand pivot using bed rail and not walker with close S.  Sit to supine with S.  Left with bed alarm active and needs in reach.   Therapy Documentation Precautions:  Precautions Precautions: Fall Precaution Comments: Soft cast on L lower leg, R wrist with brace. Required Braces or Orthoses: Splint/Cast Splint/Cast: R wrist cock- up; LLE Soft Cast Restrictions Weight Bearing Restrictions: Yes RUE Weight Bearing: Weight bearing as tolerated LLE Weight Bearing: Weight bearing as tolerated Other Position/Activity Restrictions: Pt NWB on R wrist/ hand, but is allowed to WB through elbow Pain: Pain Assessment Pain Score: 6  Pain Type: Surgical pain Pain Location: Hand Pain Orientation: Right Pain Descriptors / Indicators:  Aching;Throbbing Pain Onset: On-going Pain Intervention(s): Rest   Exercises: General Exercises - Lower Extremity Long Arc Quad: Strengthening;Left;10 reps;Supine Straight Leg Raises: Strengthening;Left;10 reps;Supine Total Joint Exercises Bridges: Strengthening;Right;10 reps;Supine   Therapy/Group: Individual Therapy  Reginia Naas  Magda Kiel, PT 01/16/2021, 12:54 PM

## 2021-01-16 NOTE — Progress Notes (Signed)
Occupational Therapy Session Note  Patient Details  Name: Joanna Wright MRN: [id number] Date of Birth: Aug 13, 1971  Today's Date: 01/16/2021 OT Individual Time: 2876-8115 OT Individual Time Calculation (min): 60 min    Short Term Goals: Week 1:  OT Short Term Goal 1 (Week 1): STG = LTG 2/2 ELOS  Skilled Therapeutic Interventions/Progress Updates:    Treatment session with focus on self-care retraining and functional mobility. Pt received semi-reclined in bed agreeable to therapy session.  Pt requested to wait to shower at the end of the day, therefore scheduled to allow for PM OT session tomorrow.  Pt completed dressing from EOB with lateral leans to pull underwear and pants over hips at Mod I level. Pt then stood with CGA to fully adjust pants over hips.  Pt ambulated to sink with PFRW with CGA.  Pt completed oral care and washing face in standing with close supervision.  Pt returned to bed to rest, reports LLE "jumping" in dependent position.  Pt completed forward/backward ambulation 5' with PFRW with CGA to simulate accessing home bathroom. Pt reports will most likely need to back in to bathroom as unable to maneuver PFRW in bathroom.  Pt returned to supine due to reports of increased pain and discomfort in LLE.  Pt reports pain subsiding once elevated in bed.  Discussed energy conservation strategies and modifying typical routines to increase activity tolerance, endurance, and pain management.  Therapy Documentation Precautions:  Precautions Precautions: Fall Precaution Comments: Soft cast on L lower leg, R wrist with brace. Required Braces or Orthoses: Splint/Cast Splint/Cast: R wrist cock- up; LLE Soft Cast Restrictions Weight Bearing Restrictions: Yes RUE Weight Bearing: Weight bearing as tolerated LLE Weight Bearing: Weight bearing as tolerated Other Position/Activity Restrictions: Pt NWB on R wrist/ hand, but is allowed to WB through elbow General:   Vital Signs: Therapy  Vitals Temp: 98.1 F (36.7 C) Temp Source: Oral Pulse Rate: 93 Resp: 18 BP: 135/85 Patient Position (if appropriate): Lying Oxygen Therapy SpO2: (!) 89 % O2 Device: Room Air Pain: Pain Assessment Pain Scale: 0-10 Pain Score: 0-No pain   Therapy/Group: Individual Therapy  Kelvin Burpee, [place] 01/16/2021, 10:00 AM

## 2021-01-17 DIAGNOSIS — D75839 Thrombocytosis, unspecified: Secondary | ICD-10-CM

## 2021-01-17 DIAGNOSIS — D62 Acute posthemorrhagic anemia: Secondary | ICD-10-CM

## 2021-01-17 DIAGNOSIS — S82875D Nondisplaced pilon fracture of left tibia, subsequent encounter for closed fracture with routine healing: Secondary | ICD-10-CM

## 2021-01-17 DIAGNOSIS — G8918 Other acute postprocedural pain: Secondary | ICD-10-CM
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
## 2021-01-17 NOTE — Progress Notes (Signed)
Occupational Therapy Session Note  Patient Details  Name: Joanna Wright MRN: [id number] Date of Birth: Feb 09, 1971  Today's Date: 01/17/2021 OT Individual Time: 8786-7672 OT Individual Time Calculation (min): 62 min    Short Term Goals: Week 1:  OT Short Term Goal 1 (Week 1): STG = LTG 2/2 ELOS  Skilled Therapeutic Interventions/Progress Updates:    Treatment session with focus on self-care retraining and functional transfers and mobility. Pt received semi-reclined in bed reporting slight headache but agreeable to therapy session.  Pt completed bed mobility supervision to EOB.  Pt completed sit > stand with PFRW and CGA.  Pt ambulated to room shower with PFRW with CGA.  Pt completed shower transfer with tub transfer bench and close supervision.  Therapist covered LLE cast and RUE brace prior to shower to ensure remaining dry.  Pt completed bathing at overall distant supervision when seated and supervision when standing to wash buttocks.  Pt completed dressing with increased time and supervision, but no physical assistance this session.  Pt ambulated back to bed with PFRW with close supervision and returned to supine without assistance. Pt reports pleased with ability to shower.  Pt remained semi-reclined in bed with all needs in reach.  Therapy Documentation Precautions:  Precautions Precautions: Fall Precaution Comments: Soft cast on L lower leg, R wrist with brace. Required Braces or Orthoses: Splint/Cast Splint/Cast: R wrist cock- up; LLE Soft Cast Restrictions Weight Bearing Restrictions: Yes RUE Weight Bearing: Weight bear through elbow only LLE Weight Bearing: Non weight bearing Other Position/Activity Restrictions: Pt NWB on R wrist/ hand, but is allowed to WB through elbow General:   Vital Signs: Therapy Vitals Temp: 98.7 F (37.1 C) Temp Source: Oral Pulse Rate: 99 Resp: 17 BP: 138/83 Patient Position (if appropriate): Lying Oxygen Therapy SpO2: 98 % O2 Device: Room  Air Pain:  Pt with no c/o pain   Therapy/Group: Individual Therapy  Simonne Come 01/17/2021, 3:45 PM
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
## 2021-01-17 NOTE — Progress Notes (Signed)
Physical Therapy Session Note  Patient Details  Name: Joanna Wright MRN: [id number] Date of Birth: [birth year]  Today's Date: 01/17/2021 PT Individual Time: 0800-0908 PT Individual Time Calculation (min): 68 min   Short Term Goals: Week 1:  PT Short Term Goal 1 (Week 1): STG = LTG d/t ELOS  Skilled Therapeutic Interventions/Progress Updates:   Received pt supine in bed, pt agreeable to therapy, and denied any pain but reported fatigue from not sleeping well last night. RN present to administer morning medications. Session with emphasis on functional mobility/transfers, generalized strengthening, dynamic standing balance/coordination, gait training, and improved activity tolerance. Supine<>sit with supervision and donned R shoe with supervision. Pt continues to demonstrate good adherence to WB precautions throughout session. Stand<>pivot bed<>WC without AD to R with CGA. Pt performed WC mobility 120ft x 1 and >339ft x 1 using RLE and LUE with supervision (including getting on/off elevator). Sit<>stand with PFRW and supervision and ambulated 61ft with PFRW and close supervision using a "hop to" pattern while maintaining WB precautions. Pt demonstrates improvements in ability to use UEs to take pressure off R knee but with fatigue, demonstrates decreased R foot clearance. Pt performed x 2 additional squat<>pivot transfers without AD and CGA/close supervision with min cues for set up of WC placement. Sit<>semi-reclined on wedge with supervision and performed the following exercises: -SLR 3x10 bilaterally with 2lb ankle weight on RLE -hip abduction 3x10 bilaterally with 2lb ankle weight on RLE -marches 3x10 bilaterally with 2lb ankle weight on RLE -R single leg bridge 3x8 Provided pt with HEP and educated on frequency/duration/technique for the following exercises: -Supine Active Straight Leg Raise - 1 x daily - 7 x weekly - 3 sets - 10 reps -Supine Hip Abduction - 1 x daily - 7 x weekly - 3 sets - 10  reps -Supine March - 1 x daily - 7 x weekly - 3 sets - 10 reps -Single Leg Bridge - 1 x daily - 7 x weekly - 3 sets - 8 reps Semi-reclined on wedge<>sitting on mat with supervision. Pt transported back to room in [facility] total A and requested to go to [place] for smoothie. Due to time restrictions, left pt in care of NT escorting pt downstairs to [place].   Therapy Documentation Precautions:  Precautions Precautions: Fall Precaution Comments: Soft cast on L lower leg, R wrist with brace. Required Braces or Orthoses: Splint/Cast Splint/Cast: R wrist cock- up; LLE Soft Cast Restrictions Weight Bearing Restrictions: Yes RUE Weight Bearing: Weight bearing as tolerated LLE Weight Bearing: Weight bearing as tolerated Other Position/Activity Restrictions: Pt NWB on R wrist/ hand, but is allowed to WB through elbow   Therapy/Group: Individual Therapy Alfonse Alpers PT, DPT   01/17/2021, 7:17 AM

## 2021-01-18 ENCOUNTER — Other Ambulatory Visit (HOSPITAL_COMMUNITY): Payer: Self-pay

## 2021-01-18 MED ORDER — APIXABAN 2.5 MG PO TABS
2.5000 mg | ORAL_TABLET | Freq: Two times a day (BID) | ORAL | 0 refills | Status: DC
  Filled 2021-01-18: qty 24, 12d supply, fill #0

## 2021-01-18 MED ORDER — DOCUSATE SODIUM 100 MG PO CAPS: 100.0000 mg | ORAL_CAPSULE | Freq: Two times a day (BID) | ORAL | 0 refills | Status: DC

## 2021-01-18 MED ORDER — LIDOCAINE 5 % EX PTCH
1.0000 | MEDICATED_PATCH | CUTANEOUS | 0 refills | Status: DC
  Filled 2021-01-18: qty 30, 30d supply, fill #0

## 2021-01-18 MED ORDER — POLYETHYLENE GLYCOL 3350 17 G PO PACK: 17.0000 g | PACK | Freq: Every day | ORAL | 0 refills | Status: DC

## 2021-01-18 MED ORDER — OXYCODONE HCL 5 MG PO TABS
5.0000 mg | ORAL_TABLET | ORAL | 0 refills | Status: DC | PRN
  Filled 2021-01-18: qty 30, 3d supply, fill #0

## 2021-01-18 MED ORDER — VITAMIN D (ERGOCALCIFEROL) 1.25 MG (50000 UNIT) PO CAPS
50000.0000 [IU] | ORAL_CAPSULE | ORAL | 0 refills | Status: DC
  Filled 2021-01-18: qty 5, 35d supply, fill #0
  Filled 2021-01-18: qty 5, 30d supply, fill #0

## 2021-01-18 MED ORDER — AMLODIPINE BESYLATE 10 MG PO TABS
10.0000 mg | ORAL_TABLET | Freq: Every day | ORAL | 0 refills | Status: DC
  Filled 2021-01-18: qty 30, 30d supply, fill #0

## 2021-01-18 MED ORDER — MELATONIN 3 MG PO TABS
3.0000 mg | ORAL_TABLET | Freq: Every day | ORAL | 0 refills | Status: DC
  Filled 2021-01-18: qty 30, 30d supply, fill #0

## 2021-01-18 MED ORDER — GABAPENTIN 100 MG PO CAPS
100.0000 mg | ORAL_CAPSULE | Freq: Three times a day (TID) | ORAL | 0 refills | Status: DC
  Filled 2021-01-18: qty 90, 30d supply, fill #0

## 2021-01-18 MED ORDER — ACETAMINOPHEN 325 MG PO TABS: 325.0000 mg | ORAL_TABLET | ORAL | Status: DC | PRN

## 2021-01-18 MED ORDER — METHOCARBAMOL 500 MG PO TABS
1000.0000 mg | ORAL_TABLET | Freq: Four times a day (QID) | ORAL | 0 refills | Status: DC
  Filled 2021-01-18: qty 240, 30d supply, fill #0

## 2021-01-18 MED ORDER — SIMETHICONE 80 MG PO CHEW
80.0000 mg | CHEWABLE_TABLET | Freq: Four times a day (QID) | ORAL | Status: DC | PRN
  Filled 2021-01-18: qty 1

## 2021-01-18 NOTE — Progress Notes (Signed)
Occupational Therapy Session Note  Patient Details  Name: Joanna Wright MRN: [id number] Date of Birth: [birth year]  Today's Date: 01/18/2021 OT Individual Time: 1301-1403 OT Individual Time Calculation (min): 62 min    Short Term Goals: Week 1:  OT Short Term Goal 1 (Week 1): STG = LTG 2/2 ELOS  Skilled Therapeutic Interventions/Progress Updates:    Pt received sat up in bed, agreeable to therapy. Session focus on functional mobility, activity tolerance, and dynamic standing balance in prep for improved ADL/IADL performance. Pt reports no current concerns in prep for DC. Amb total of 250 ft with PFRW + close S. Req 2 seated rest reaks throughout. Self-propelled self to gym with distant S, ind managing w/c parts. Stood to complete game of connect four, able to squat on RLE to retrieve pieces from knee height on L side. W/c transport back to room 2/2 pt fatigue. Stand-pivot back to bed same manner as above. Pt able to doff/don R shoe distant S. C/o R wrist pain at end of session , ceases with elevation.  Pt left seated up in bed with bed alarm engaged, call bell in reach, and all immediate needs met.    Therapy Documentation Precautions:  Precautions Precautions: Fall Precaution Comments: Soft cast on L lower leg, R wrist with brace. Required Braces or Orthoses: Splint/Cast Splint/Cast: R wrist cock- up; LLE Soft Cast Restrictions Weight Bearing Restrictions: Yes RUE Weight Bearing: Weight bear through elbow only LLE Weight Bearing: Non weight bearing Other Position/Activity Restrictions: Pt NWB on R wrist/ hand, but is allowed to WB through elbow Pain: see session note   ADL: See Care Tool for more details.   Therapy/Group: Individual Therapy  Volanda Napoleon MS, OTR/L  01/18/2021, 2:49 PM
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
## 2021-01-18 NOTE — Discharge Instructions (Signed)
Inpatient Rehab Discharge Instructions  [place] Discharge date and time: No discharge date for patient encounter.   Activities/Precautions/ Functional Status: Activity: Nonweightbearing right upper extremity through the wrist okay to bear weight through the elbow.  Nonweightbearing left lower extremity Diet: regular diet Wound Care: Routine skin checks Functional status:  ___ No restrictions     ___ Walk up steps independently ___ 24/7 supervision/assistance   ___ Walk up steps with assistance ___ Intermittent supervision/assistance  ___ Bathe/dress independently ___ Walk with walker     _x__ Bathe/dress with assistance ___ Walk Independently    ___ Shower independently ___ Walk with assistance    ___ Shower with assistance ___ No alcohol     ___ Return to work/school ________  COMMUNITY REFERRALS UPON DISCHARGE:    Outpatient: PT    OT                Agency: [facility]  Phone: [phone]              Appointment Date/Time: TBD at Discharge Upon WB Status  Medical Equipment/Items Ordered: Quarry manager, Radio broadcast assistant, Civil engineer, contracting                                                 Agency/Supplier: [employer]   Special Instructions: No driving smoking or alcohol   My questions have been answered and I understand these instructions. I will adhere to these goals and the provided educational materials after my discharge from the hospital.  Patient/Caregiver Signature _______________________________ Date __________  Clinician Signature _______________________________________ Date __________  Please bring this form and your medication list with you to all your follow-up doctor's appointments.   Information on my medicine - ELIQUIS (apixaban)  This medication education was reviewed with me or my healthcare representative as part of my discharge preparation.  The pharmacist that spoke with me during my hospital stay was:  Onnie Boer,  RPH-CPP  Why was Eliquis prescribed for you? Eliquis was prescribed for you to reduce the risk of blood clots forming after orthopedic surgery.    What do You need to know about Eliquis? Take your Eliquis TWICE DAILY - one tablet in the morning and one tablet in the evening with or without food.  It would be best to take the dose about the same time each day.  If you have difficulty swallowing the tablet whole please discuss with your pharmacist how to take the medication safely.  Take Eliquis exactly as prescribed by your doctor and DO NOT stop taking Eliquis without talking to the doctor who prescribed the medication.  Stopping without other medication to take the place of Eliquis may increase your risk of developing a clot.  After discharge, you should have regular check-up appointments with your healthcare provider that is prescribing your Eliquis.  What do you do if you miss a dose? If a dose of ELIQUIS is not taken at the scheduled time, take it as soon as possible on the same day and twice-daily administration should be resumed.  The dose should not be doubled to make up for a missed dose.  Do not take more than one tablet of ELIQUIS at the same time.  Important Safety Information A possible side effect of Eliquis is bleeding. You should call your healthcare provider right away if you experience any of  the following: ? Bleeding from an injury or your nose that does not stop. ? Unusual colored urine (red or dark brown) or unusual colored stools (red or black). ? Unusual bruising for unknown reasons. ? A serious fall or if you hit your head (even if there is no bleeding).  Some medicines may interact with Eliquis and might increase your risk of bleeding or clotting while on Eliquis. To help avoid this, consult your healthcare provider or pharmacist prior to using any new prescription or non-prescription medications, including herbals, vitamins, non-steroidal anti-inflammatory  drugs (NSAIDs) and supplements.  This website has more information on Eliquis (apixaban): http://www.eliquis.com/eliquis/home

## 2021-01-18 NOTE — Progress Notes (Signed)
Physical Therapy Session Note  Patient Details  Name: Joanna Wright MRN: [id number] Date of Birth: [birth year]  Today's Date: 01/18/2021 PT Individual Time: 1000-1055 PT Individual Time Calculation (min): 55 min   Short Term Goals: Week 1:  PT Short Term Goal 1 (Week 1): STG = LTG d/t ELOS  Skilled Therapeutic Interventions/Progress Updates:   Received pt supine in bed, pt agreeable to therapy, and reported pain 3/10 in R knee (premedicated). Repositioning and rest breaks done to reduce pain levels. Session with emphasis on functional mobility/transfers, generalized strengthening, dynamic standing balance/coordination, gait training, and improved activity tolerance. Supine<>sitting EOB with supervision and donned R shoe with set up assist. Pt demonstrates excellent adherence to all WB precautions. Squat<>pivot bed<>WC without AD and supervision. Pt transported to 4W dayroom in [facility] total A for energy conversation purposes. Pt requested female OT for afternoon session; notified scheduling. Pt performed seated RLE strengthening on Kinetron with therapist providing manual counter resistance for 1 minute x 1 trial at 30 cm/sec and for 1 minute x 3 trials at 10 cm/sec with emphasis on quad/glute strengthening. Pt ambulated 30ft with PFRW and supervision while maintaining WB precautions using a "hop to" pattern with emphasis on using UE's to take pressure off R knee. Pt performed the following seated exercises with supervision and verbal cues for technique: -LAQ 1x20 with 5lb ankle weight on RLE increasing to 1x20 with 7lb weight and 2lb ankle weight on LLE for 1x20 increasing to 5lb weight 1x20 -hip flexion 2x20 with 7lb weight on RLE and 5lb weight on LLE Pt transported back to room in [facility] total A and transferred WC<>bed squat<>pivot with supervision and sit<>supine with supervision. Concluded session with pt supine in bed, needs within reach, and bed alarm on. Provided pt with ice pack for R knee.    Therapy Documentation Precautions:  Precautions Precautions: Fall Precaution Comments: Soft cast on L lower leg, R wrist with brace. Required Braces or Orthoses: Splint/Cast Splint/Cast: R wrist cock- up; LLE Soft Cast Restrictions Weight Bearing Restrictions: Yes RUE Weight Bearing: Weight bear through elbow only LLE Weight Bearing: Non weight bearing Other Position/Activity Restrictions: Pt NWB on R wrist/ hand, but is allowed to WB through elbow  Therapy/Group: Individual Therapy Alfonse Alpers PT, DPT   01/18/2021, 7:24 AM

## 2021-01-18 NOTE — Progress Notes (Signed)
Inpatient Rehabilitation Care Coordinator Discharge Note  The overall goal for the admission was met for:   Discharge location: Yes, home  Length of Stay: Yes, 9 days  Discharge activity level: Yes  Home/community participation: Yes  Services provided included: MD, RD, PT, OT, SLP, RN, CM, TR, Pharmacy, Neuropsych and SW  Financial Services: Private Insurance: [employer]  Choices offered to/list presented to:pt  Follow-up services arranged: Outpatient: [facility]  Comments (or additional information): PT OT Wheelchair, Platform Rolling [place], Shower Chair   Patient/Family verbalized understanding of follow-up arrangements: Yes  Individual responsible for coordination of the follow-up plan: Edwyna Ready [phone]  Confirmed correct DME delivered: Dyanne Iha 01/18/2021    Dyanne Iha
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
## 2021-01-18 NOTE — Progress Notes (Signed)
Occupational Therapy Session Note  Patient Details  Name: Joanna Wright MRN: [id number] Date of Birth: 1971-01-27  Time in : 0829 Time Out: 0929 Total: 60 mins   Short Term Goals: Week 1:  OT Short Term Goal 1 (Week 1): STG = LTG 2/2 ELOS  Skilled Therapeutic Interventions/Progress Updates:    Pt received in room in bed and consented to OT tx. Session focused on functional transfer training, functional mobility with PFRW, w/c mgmt and mobility, and RUE strengthening HEP. Pt progressed to SUP with functional transfers with PFRW this day from EOB<>w/c with STS and hopping/pivoting. Pt trained in hopping backwards 6' to sit in chair to simulate home bathroom environment as pt's bathroom is small and pt will not be able to turn around with walker at home. Pt instructed in w/c propulsion and mobility, managing small spaces and tight turns with min cues, and educated in leg rest mgmt with good teach back. Pt taken outside and instructed in UE HEP with 2-3# in RUE, no weight in LUE. See below for specific exercises. After tx, pt helped back to bed with SUP for SPT with walker and cuing to reach back for bed with L hand. Pt left semifowler in bed with all needs met after tx.   Therapy Documentation Precautions:  Precautions Precautions: Fall Precaution Comments: Soft cast on L lower leg, R wrist with brace. Required Braces or Orthoses: Splint/Cast Splint/Cast: R wrist cock- up; LLE Soft Cast Restrictions Weight Bearing Restrictions: Yes RUE Weight Bearing: Weight bear through elbow only LLE Weight Bearing: Non weight bearing Other Position/Activity Restrictions: Pt NWB on R wrist/ hand, but is allowed to WB through elbow Pain: Pain Assessment Pain Scale: 0-10 Pain Score: 0-No pain ADL: ADL Eating: Set up Where Assessed-Eating: Wheelchair,Bed level Grooming: Contact guard Where Assessed-Grooming: Standing at sink Upper Body Bathing: Supervision/safety Where Assessed-Upper Body  Bathing: Sitting at sink Lower Body Bathing: Contact guard Where Assessed-Lower Body Bathing: Standing at sink Upper Body Dressing: Minimal assistance Where Assessed-Upper Body Dressing: Wheelchair Lower Body Dressing: Minimal assistance Where Assessed-Lower Body Dressing: Standing at sink,Sitting at sink Toileting: Contact guard Where Assessed-Toileting: Glass blower/designer: Therapist, music Method: Counselling psychologist: Grab bars Exercises:  Instructed in BUE strengthening HEP to increase strength and activity tolerance for ADLs and functional transfers. 3# db (weight in L hand only, exercises completed with RUE with no weight) exercises including elbow flexion, shoulder press, chest press, 2#db used in L hand for shoulder flexion, abduction, and upright row, all for 3x10 with min cuing for proper tech with good carryover.    Therapy/Group: Individual Therapy  Joanna Wright 01/18/2021, 11:53 AM

## 2021-01-19 MED ORDER — OXYCODONE HCL 5 MG PO TABS: 5.0000 mg | ORAL_TABLET | Freq: Four times a day (QID) | ORAL | Status: DC | PRN

## 2021-01-19 NOTE — Progress Notes (Signed)
Occupational Therapy Session Note  Patient Details  Name: Joanna Wright MRN: [id number] Date of Birth: [birth year]  Today's Date: 01/19/2021 OT Individual Time: 1400-1502 OT Individual Time Calculation (min): 62 min    Short Term Goals: Week 1:  OT Short Term Goal 1 (Week 1): STG = LTG 2/2 ELOS  Skilled Therapeutic Interventions/Progress Updates:    Pt received in room in bed and consented to OT tx. Session focused on ADL routine including showering, dressing, grooming, and functional mobility with new PFRW that was issued to her in room. Therapist wrapped LLE and RUE in trash bags to remain dry during shower. Therapist adjusted new PFRW to pt's correct height and pt preferences in order for safe use in home environment. Pt reports it being much better now and feels safer. Pt required increased time for dressing, bathing, and grooming thoroughness. All precautions maintained throughout tx. Instructed pt in use of PFRW to get in and out of the bathroom and hopping to bed in room distances with pt completing with SUP for safety. Pt c/o increased pain in R wrist at beginning and end of session, given ice pack to relieve pain. After tx, pt left in bed with all needs met, all questions asked and no concerns for discharge tomorrow.   Therapy Documentation Precautions:  Precautions Precautions: Fall Precaution Comments: Soft cast on L lower leg, R wrist with brace. Required Braces or Orthoses: Splint/Cast Splint/Cast: R wrist cock- up; LLE Soft Cast Restrictions Weight Bearing Restrictions: Yes RUE Weight Bearing: Weight bearing as tolerated LLE Weight Bearing: Non weight bearing Other Position/Activity Restrictions: Pt NWB on R wrist/ hand, but is allowed to WB through elbow    Vital Signs: Therapy Vitals Temp: 98.6 F (37 C) Temp Source: Oral Pulse Rate: 90 Resp: 17 BP: 139/88 Patient Position (if appropriate): Lying Oxygen Therapy SpO2: 100 % O2 Device: Room Air Pain: Pain  Assessment Pain Scale: 0-10 Pain Score: 6  ADL: ADL Eating: Modified independent Where Assessed-Eating: Wheelchair Grooming: Modified independent Where Assessed-Grooming: Sitting at sink,Standing at sink Upper Body Bathing: Modified independent Where Assessed-Upper Body Bathing: Shower Lower Body Bathing: Modified independent Where Assessed-Lower Body Bathing: Shower Upper Body Dressing: Modified independent (Device) Where Assessed-Upper Body Dressing: Wheelchair Lower Body Dressing: Modified independent Where Assessed-Lower Body Dressing: Edge of bed Toileting: Supervision/safety Where Assessed-Toileting: Glass blower/designer: Close supervision Toilet Transfer Method: Counselling psychologist: Ambulance person Transfer: Close supervison Clinical cytogeneticist Method: Optometrist: Radio broadcast assistant   Therapy/Group: Individual Therapy  Kymoni Lesperance 01/19/2021, 2:39 PM
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
## 2021-01-19 NOTE — Progress Notes (Signed)
Physical Therapy Discharge Summary  Patient Details  Name: Joanna Wright MRN: [id number] Date of Birth: August 07, 1971  Today's Date: 01/19/2021 PT Individual Time: 0900-0957 PT Individual Time Calculation (min): 57 min   Patient has met 9 of 10 long term goals due to improved activity tolerance, improved balance, improved postural control, increased strength, increased range of motion, decreased pain, improved awareness and improved coordination. Patient to discharge at a wheelchair level Modified Independent.  Patient's care partner is independent to provide the necessary physical assistance at discharge. Pt's family did not attend family education training, however pt to discharge at a mod I level overall and demonstrates good safety awareness and adherence to NWB restrictions.   Reasons goals not met: Pt did not meet ambulation goal of 174ft at pt is only able to ambulate up to 33ft with PFRW and supervision due to fatigue, generalized weakness, and increased strain on R knee.   Recommendation:  Patient will benefit from ongoing skilled PT services in  outpatient setting once able to weight bear  to continue to advance safe functional mobility, address ongoing impairments in transfers, generalized strengthening, dynamic standing balance/coordination, gait training, endurance, and to minimize fall risk.  Equipment: 18x18 manual WC, PFRW , pt will be ordering shower chair for stair navigation  Reasons for discharge: treatment goals met  Patient/family agrees with progress made and goals achieved: Yes  Today's Interventions: Received pt sitting in WC, pt agreeable to therapy, and reported pain 6/10 in R wrist (premedicated). Repositioning and rest breaks done to reduce pain levels. Session with emphasis on discharge planning, functional mobility/transfers, generalized strengthening, dynamic standing balance/coordination, simulated car transfers, stair navigation, and improved activity  tolerance. Pt performed WC mobility 177ft x 1 and 13ft x 1 using RLE and LUE mod I to ortho gym. Pt demonstrates excellent adherence to all NWB precautions. Pt performed simulated car transfer with PFRW and supervision and ambulated 17ft on uneven surfaces (ramp) with PFRW and CGA. Pt navigated 9 steps with L handrail using shower chair with supervision. Pt able to move the shower chair down stairs independently using LUE but required total A to move up stairs due to inability to use RUE. Pt transported back to room in [facility] total A and educated on WC parts management (including donning/doffing legrests and adjusting height of legrests). Adjusted equipment and demonstrated how to fold up WC to get into back of car. Practiced standing and short distance gait with new PFRW and supervision; pt prefers to have both attachments on RW for comfort. Concluded session with pt sitting EOB with all needs within reach.   PT Discharge Precautions/Restrictions Precautions Precautions: Fall Precaution Comments: Soft cast on L lower leg, R wrist with brace. Required Braces or Orthoses: Splint/Cast Splint/Cast: R wrist cock- up; LLE Soft Cast Restrictions Weight Bearing Restrictions: Yes RUE Weight Bearing: Weight bear through elbow only LLE Weight Bearing: Non weight bearing Other Position/Activity Restrictions: Pt NWB on R wrist/ hand, but is allowed to WB through elbow Cognition Overall Cognitive Status: Within Functional Limits for tasks assessed Arousal/Alertness: Awake/alert Orientation Level: Oriented X4 Memory: Appears intact Awareness: Appears intact Problem Solving: Appears intact Safety/Judgment: Appears intact Sensation Sensation Light Touch: Impaired by gross assessment Proprioception: Appears Intact Additional Comments: decreased sensation along L great toe and absent sensation on L medial/lateral malleoli Coordination Gross Motor Movements are Fluid and Coordinated: No Fine Motor Movements  are Fluid and Coordinated: No Coordination and Movement Description: uncoordinated due to NWB restrictions, pain, generalized weakness, and decreased  balance/postural control Finger Nose Finger Test: slower on RUE and painful Heel Shin Test: [facility] for RLE; slower on LLE Motor  Motor Motor: Abnormal postural alignment and control Motor - Skilled Clinical Observations: uncoordinated due to NWB restrictions, pain, generalized weakness, and decreased balance/postural control  Mobility Bed Mobility Bed Mobility: Rolling Right;Rolling Left;Sit to Supine;Supine to Sit Rolling Right: Independent with assistive device Rolling Left: Independent with assistive device Supine to Sit: Independent with assistive device Sit to Supine: Independent with assistive device Transfers Transfers: Sit to Stand;Stand to Sit;Stand Pivot Transfers;Squat Pivot Transfers Sit to Stand: Independent with assistive device Stand to Sit: Independent with assistive device Stand Pivot Transfers: Independent with assistive device Squat Pivot Transfers: Independent with assistive device Transfer (Assistive device): Right platform walker Locomotion  Gait Ambulation: Yes Gait Assistance: Supervision/Verbal cueing Gait Distance (Feet): 90 Feet Assistive device: Right platform walker Gait Assistance Details: Verbal cues for safe use of DME/AE;Verbal cues for precautions/safety;Verbal cues for technique Gait Assistance Details: verbal cues to keep PFRW underneath shoulders and for RLE foot clearance Gait Gait: Yes Gait Pattern: Impaired Gait Pattern: Step-to pattern;Decreased step length - right;Poor foot clearance - right;Antalgic;Decreased stride length Gait velocity: decreased Stairs / Additional Locomotion Stairs: Yes Stairs Assistance: Supervision/Verbal cueing Stair Management Technique: Other (comment) (with shower chair) Number of Stairs: 9 Height of Stairs: 6 Ramp: Contact Guard/touching assist (PFRW) Wheelchair  Mobility Wheelchair Mobility: Yes Wheelchair Assistance: Independent with assistive device Wheelchair Propulsion: Left upper extremity;Right lower extremity Wheelchair Parts Management: Needs assistance Distance: 141ft  Trunk/Postural Assessment  Cervical Assessment Cervical Assessment: Within Functional Limits Thoracic Assessment Thoracic Assessment: Within Functional Limits Lumbar Assessment Lumbar Assessment: Within Functional Limits Postural Control Postural Control: Within Functional Limits  Balance Balance Balance Assessed: Yes Static Sitting Balance Static Sitting - Balance Support: Feet supported;No upper extremity supported Static Sitting - Level of Assistance: 7: Independent Dynamic Sitting Balance Dynamic Sitting - Balance Support: Feet supported;No upper extremity supported Dynamic Sitting - Level of Assistance: 6: Modified independent (Device/Increase time) Static Standing Balance Static Standing - Balance Support: Bilateral upper extremity supported (PFRW) Static Standing - Level of Assistance: 6: Modified independent (Device/Increase time) Dynamic Standing Balance Dynamic Standing - Balance Support: Bilateral upper extremity supported (PFRW) Dynamic Standing - Level of Assistance: 6: Modified independent (Device/Increase time) Dynamic Standing - Comments: during transfers Extremity Assessment  RLE Assessment RLE Assessment: Within Functional Limits LLE Assessment LLE Assessment: Exceptions to [facility] General Strength Comments: grossly generalized to 4/5 (unable to perform PF/DF due to cast)  Alfonse Alpers PT, DPT  01/19/2021, 7:23 AM

## 2021-01-19 NOTE — Progress Notes (Signed)
Occupational Therapy Discharge Summary  Patient Details  Name: Joanna Wright MRN: [id number] Date of Birth: 10-06-1970   Patient has met 11 of 12 long term goals due to improved activity tolerance, improved balance, postural control, ability to compensate for deficits and improved awareness.  Patient to discharge at overall Modified Independent level with bathing and dressing tasks, Supervision with transfers.  Patient's care partner is independent to provide the necessary physical assistance at discharge.  Patient reports husband will be working part time and in addition to step children she will have 24/7 supervision.  No formal family education completed, however pt can direct care as needed.  Reasons goals not met: Pt still requires supervision with ambulatory transfers and dynamic standing balance during toileting tasks.  Recommendation:  Patient will benefit from ongoing skilled OT services in outpatient setting to continue to advance functional skills in the area of BADL and Reduce care partner burden.  Equipment: No equipment provided  Reasons for discharge: treatment goals met and discharge from hospital  Patient/family agrees with progress made and goals achieved: Yes  OT Discharge Precautions/Restrictions  Precautions Precautions: Fall Precaution Comments: Soft cast on L lower leg, R wrist with brace. Required Braces or Orthoses: Splint/Cast Splint/Cast: R wrist cock- up; LLE Soft Cast Restrictions Weight Bearing Restrictions: Yes RUE Weight Bearing: Weight bear through elbow only LLE Weight Bearing: Non weight bearing Other Position/Activity Restrictions: Pt NWB on R wrist/ hand, but is allowed to WB through elbow General   Vital Signs Therapy Vitals Temp: 98.7 F (37.1 C) Temp Source: Oral Pulse Rate: 95 Resp: 18 BP: 126/79 Patient Position (if appropriate): Lying Oxygen Therapy SpO2: 98 % O2 Device: Room Air Pain Pain Assessment Pain Scale: 0-10 Pain  Score: 5  Pain Type: Surgical pain Pain Location: Hand Pain Orientation: Right Pain Descriptors / Indicators: Aching;Throbbing Pain Onset: On-going Pain Intervention(s): RN made aware;Repositioned (premedicated) Multiple Pain Sites: Yes 2nd Pain Site Pain Score: 5 Pain Type: Surgical pain Pain Location: Ankle Pain Orientation: Left Pain Descriptors / Indicators: Aching;Throbbing Pain Intervention(s): RN made aware;Repositioned (premedicated) ADL ADL Eating: Modified independent Where Assessed-Eating: Wheelchair Grooming: Modified independent Where Assessed-Grooming: Sitting at sink,Standing at sink Upper Body Bathing: Modified independent Where Assessed-Upper Body Bathing: Shower Lower Body Bathing: Modified independent Where Assessed-Lower Body Bathing: Shower Upper Body Dressing: Modified independent (Device) Where Assessed-Upper Body Dressing: Wheelchair Lower Body Dressing: Modified independent Where Assessed-Lower Body Dressing: Edge of bed Toileting: Supervision/safety Where Assessed-Toileting: Glass blower/designer: Close supervision Toilet Transfer Method: Counselling psychologist: Grab bars Tub/Shower Transfer: Close supervison Clinical cytogeneticist Method: Optometrist: Gaffer Baseline Vision/History: Wears glasses (contacts) Wears Glasses: At all times Patient Visual Report: No change from baseline Vision Assessment?: No apparent visual deficits Perception  Perception: Within Functional Limits Praxis Praxis: Intact Cognition Overall Cognitive Status: Within Functional Limits for tasks assessed Arousal/Alertness: Awake/alert Orientation Level: Oriented X4 Memory: Appears intact Awareness: Appears intact Problem Solving: Appears intact Safety/Judgment: Appears intact Sensation Sensation Light Touch: Appears Intact Proprioception: Appears Intact Additional Comments: Unable to fully assess LLE 2/2 cast,  denies N/T Coordination Fine Motor Movements are Fluid and Coordinated: No Coordination and Movement Description: impaired L [facility] 2/2 wrist splint req A for clasps/opening objects;  slowed gross motor movement 2/2 pain/NWB restrictions Motor  Motor Motor: Abnormal postural alignment and control Motor - Skilled Clinical Observations: uncoordinated due to NWB restrictions, pain, generalized weakness, and decreased balance/postural control Mobility  Bed Mobility Bed Mobility: Rolling Right;Rolling Left;Sit to Supine;Supine to  Sit Rolling Right: Independent with assistive device Rolling Left: Independent with assistive device Supine to Sit: Independent with assistive device Sit to Supine: Independent with assistive device Transfers Sit to Stand: Independent with assistive device Stand to Sit: Independent with assistive device  Trunk/Postural Assessment  Cervical Assessment Cervical Assessment: Within Functional Limits Thoracic Assessment Thoracic Assessment: Within Functional Limits Lumbar Assessment Lumbar Assessment: Within Functional Limits Postural Control Postural Control: Within Functional Limits  Balance Balance Balance Assessed: Yes Static Sitting Balance Static Sitting - Balance Support: Feet supported;No upper extremity supported Static Sitting - Level of Assistance: 7: Independent Dynamic Sitting Balance Dynamic Sitting - Balance Support: Feet supported;No upper extremity supported Dynamic Sitting - Level of Assistance: 6: Modified independent (Device/Increase time) Static Standing Balance Static Standing - Balance Support: Bilateral upper extremity supported (PFRW) Static Standing - Level of Assistance: 6: Modified independent (Device/Increase time) Dynamic Standing Balance Dynamic Standing - Balance Support: Bilateral upper extremity supported (PFRW) Dynamic Standing - Level of Assistance: 6: Modified independent (Device/Increase time) Dynamic Standing - Comments:  during transfers Extremity/Trunk Assessment RUE Assessment RUE Assessment: Exceptions to [facility] Active Range of Motion (AROM) Comments: WFL General Strength Comments: RUE NWB, ok to WB through elbow, limited assessment 2/2 wrist pain/wrist cock-up splint donned RUE Body System: Ortho LUE Assessment LUE Assessment: Within Functional Limits   Joanna Wright, [facility] 01/19/2021, 7:54 AM
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
## 2021-01-19 NOTE — Progress Notes (Signed)
Occupational Therapy Session Note  Patient Details  Name: Joanna Wright MRN: [id number] Date of Birth: 1970/12/19  Today's Date: 01/19/2021 OT Individual Time: 4098-1191 OT Individual Time Calculation (min): 56 min    Short Term Goals: Week 1:  OT Short Term Goal 1 (Week 1): STG = LTG 2/2 ELOS  Skilled Therapeutic Interventions/Progress Updates:    Treatment session with focus on self-care retraining, functional transfers, and dynamic standing balance in preparation for d/c home.  Pt received semi-reclined in bed reporting fatigue and increased pain this session.  Pt notes having received tylenol but no pain medication - therefore notified RN.  Pt completed dressing from bed level and EOB at Mod I level.  Pt completed bed mobility Mod I and sit > stand supervision from EOB with PFRW.  Pt ambulated to sink and completed oral care in standing with supervision for standing balance.  Pt ambulated backwards 6' to w/c with PFRW to simulate accessing home bathroom.  Pt transported down to ADL apt to complete shower transfers.  Completed tub/shower transfer with tub transfer bench supervision.  Pt reports having walk-in shower at home.  Setup shower seat directly next to shower ledge, therefore pt then able to back up to shower ledge and sit directly on to shower chair. Discussed placement of grab bars and WB precautions in relation to completing perineal hygiene.  Pt returned to room and remained upright in w/c with all needs in reach.  RN arriving to provide pain meds.  Therapy Documentation Precautions:  Precautions Precautions: Fall Precaution Comments: Soft cast on L lower leg, R wrist with brace. Required Braces or Orthoses: Splint/Cast Splint/Cast: R wrist cock- up; LLE Soft Cast Restrictions Weight Bearing Restrictions: Yes RUE Weight Bearing: Weight bear through elbow only LLE Weight Bearing: Non weight bearing Other Position/Activity Restrictions: Pt NWB on R wrist/ hand, but is allowed  to WB through elbow General:   Vital Signs: Therapy Vitals Temp: 98.7 F (37.1 C) Temp Source: Oral Pulse Rate: 95 Resp: 18 BP: 126/79 Patient Position (if appropriate): Lying Oxygen Therapy SpO2: 98 % O2 Device: Room Air Pain: Pain Assessment Pain Scale: 0-10 Pain Score: 5  Pain Type: Surgical pain Pain Location: Hand Pain Orientation: Right Pain Descriptors / Indicators: Aching;Throbbing Pain Onset: On-going Pain Intervention(s): RN made aware;Repositioned (premedicated) Multiple Pain Sites: Yes 2nd Pain Site Pain Score: 5 Pain Type: Surgical pain Pain Location: Ankle Pain Orientation: Left Pain Descriptors / Indicators: Aching;Throbbing Pain Intervention(s): RN made aware;Repositioned (premedicated)   Therapy/Group: Individual Therapy  Simonne Come 01/19/2021, 7:45 AM

## 2021-01-20 NOTE — Plan of Care (Addendum)
DC education and meds reviewed with patient who verbalizes understanding. All questions/concerns addressed. Pt in no distress, breathing even and unlabored, vitals WNL. DC medications delivered. Pt escorted to lobby by nursing staff via wheelchair and to DC home with spouse.

## 2021-01-22 ENCOUNTER — Ambulatory Visit: Payer: PRIVATE HEALTH INSURANCE | Attending: Physician Assistant

## 2021-01-22 ENCOUNTER — Telehealth: Payer: Self-pay

## 2021-01-22 ENCOUNTER — Other Ambulatory Visit: Payer: Self-pay | Admitting: Physical Medicine and Rehabilitation

## 2021-01-22 ENCOUNTER — Other Ambulatory Visit: Payer: Self-pay

## 2021-01-22 DIAGNOSIS — R600 Localized edema: Secondary | ICD-10-CM | POA: Diagnosis present

## 2021-01-22 DIAGNOSIS — R2689 Other abnormalities of gait and mobility: Secondary | ICD-10-CM | POA: Insufficient documentation

## 2021-01-22 DIAGNOSIS — M25672 Stiffness of left ankle, not elsewhere classified: Secondary | ICD-10-CM | POA: Insufficient documentation

## 2021-01-22 DIAGNOSIS — R278 Other lack of coordination: Secondary | ICD-10-CM | POA: Insufficient documentation

## 2021-01-22 DIAGNOSIS — M6281 Muscle weakness (generalized): Secondary | ICD-10-CM | POA: Diagnosis present

## 2021-01-22 DIAGNOSIS — M25641 Stiffness of right hand, not elsewhere classified: Secondary | ICD-10-CM | POA: Diagnosis present

## 2021-01-22 DIAGNOSIS — M25531 Pain in right wrist: Secondary | ICD-10-CM | POA: Insufficient documentation

## 2021-01-22 DIAGNOSIS — M25631 Stiffness of right wrist, not elsewhere classified: Secondary | ICD-10-CM | POA: Diagnosis present

## 2021-01-22 DIAGNOSIS — R262 Difficulty in walking, not elsewhere classified: Secondary | ICD-10-CM | POA: Insufficient documentation

## 2021-01-22 DIAGNOSIS — S52501S Unspecified fracture of the lower end of right radius, sequela: Secondary | ICD-10-CM
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
## 2021-01-23 ENCOUNTER — Telehealth: Payer: Self-pay | Admitting: Pharmacist

## 2021-01-23 ENCOUNTER — Other Ambulatory Visit: Payer: Self-pay

## 2021-01-23 ENCOUNTER — Other Ambulatory Visit (HOSPITAL_COMMUNITY): Payer: Self-pay

## 2021-01-23 ENCOUNTER — Telehealth: Payer: Self-pay

## 2021-01-23 NOTE — Telephone Encounter (Signed)
Pharmacy Transitions of Care Follow-up Telephone Call  Date of discharge: 01/19/21 Discharge Diagnosis: Right tibia fracture  How have you been since you were released from the hospital? Patient is doing well. Has been taking Eliquis as prescribed  Medication changes made at discharge:  - START: Eliquis 2.5mg  1tablet twice daily for 12 days then stop  -  Medication changes verified by the patient? Yes    Medication Accessibility:   Was the patient provided with refills on discharged medications? no  Have all prescriptions been transferred from [facility] to home pharmacy? N/A  Is the patient able to afford medications? yes  Medication Review:  APIXABAN (ELIQUIS)  Apixaban 2.5 mg BID initiated on 4/29. Will continue for 12 days and then stop.  - Discussed importance of taking medication around the same time everyday  - Reviewed potential DDIs with patient  - Advised patient of medications to avoid (NSAIDs, ASA)  - Educated that Tylenol (acetaminophen) will be the preferred analgesic to prevent risk of bleeding  - Emphasized importance of monitoring for signs and symptoms of bleeding (abnormal bruising, prolonged bleeding, nose bleeds, bleeding from gums, discolored urine, black tarry stools)  - Advised patient to alert all providers of anticoagulation therapy prior to starting a new medication or having a procedure    Follow-up Appointments:  PCP HPharmacy Transitions of Care Follow-up Telephone Call  Date of discharge: 01/19/21 Discharge Diagnosis: Right tibia fracture  How have you been since you were released from the hospital? Patient is doing well. Has been taking Eliquis as prescribed  Medication changes made at discharge:  - START: Eliquis 2.5mg  1tablet twice daily for 12 days then stop  -  Medication changes verified by the patient? Yes    Medication Accessibility:   Was the patient provided with refills on discharged medications? no  Have all  prescriptions been transferred from [facility] to home pharmacy? N/A  Is the patient able to afford medications? yes  Medication Review:  APIXABAN (ELIQUIS)  Apixaban 2.5 mg BID initiated on 4/29. Will continue for 12 days and then stop.  - Discussed importance of taking medication around the same time everyday  - Reviewed potential DDIs with patient  - Advised patient of medications to avoid (NSAIDs, ASA)  - Educated that Tylenol (acetaminophen) will be the preferred analgesic to prevent risk of bleeding  - Emphasized importance of monitoring for signs and symptoms of bleeding (abnormal bruising, prolonged bleeding, nose bleeds, bleeding from gums, discolored urine, black tarry stools)  - Advised patient to alert all providers of anticoagulation therapy prior to starting a new medication or having a procedure    Follow-up Appointments:  PCP Hospital f/u appt confirmed? [name] 03/06/21 Specialist Hospital f/u appt confirmed? 01/29/21 If their condition worsens, is the pt aware to call PCP or go to the Emergency Dept.? yes  Final Patient Assessment: Patient was doing well.  ospital f/u appt confirmed? [name] 03/06/21 Specialist Hospital f/u appt confirmed? 01/29/21 If their condition worsens, is the pt aware to call PCP or go to the Emergency Dept.? yes  Final Patient Assessment: Patient was doing well.

## 2021-01-23 NOTE — Telephone Encounter (Signed)
Transitional Care call--who you talked with patient itself Joanna Wright.    1. Are you/is patient experiencing  problems since coming home?no problems ,  Are there any questions regarding any aspect of care? No questions 2. Are there any questions regarding medications administration/dosing?no questions  Are meds being taken as prescribed? Not applying Lidoderm patch anymore , miralax as needed  3. Have there been any falls? No falls  4. Has Home Health been to the house and/or have they contacted you? If not, have you tried to contact them? Can we help you contact them? No pt is going to [facility]  5. Are bowels and bladder emptying properly? Are there any unexpected incontinence issues? If applicable, is patient following bowel/bladder programs? Bowels and bladder are normal. 6. Any fevers, problems with breathing, unexpected pain? No fever. No problems with breathing. 7. Are there any skin problems or new areas of breakdown? no 8. Has the patient/family member arranged specialty MD follow up (ie cardiology/neurology/renal/surgical/etc)?  Can we help arrange? Will call to schedule appt with orthopedic. 9. Does the patient need any other services or support that we can help arrange? no 10. Are caregivers following through as expected in assisting the patient? yes 11. Has the patient quit smoking, drinking alcohol, or using drugs as recommended? Patient is not a smoker . Pt does not drink.  Appointment time 9:20am , arrive time 9:00am  and who it is with here Dr.Raulkar . Hernandez

## 2021-01-29 ENCOUNTER — Encounter: Payer: Self-pay | Admitting: Physical Medicine and Rehabilitation

## 2021-01-29 ENCOUNTER — Encounter
Payer: PRIVATE HEALTH INSURANCE | Attending: Physical Medicine and Rehabilitation | Admitting: Physical Medicine and Rehabilitation

## 2021-01-29 ENCOUNTER — Other Ambulatory Visit (HOSPITAL_BASED_OUTPATIENT_CLINIC_OR_DEPARTMENT_OTHER): Payer: Self-pay

## 2021-01-29 ENCOUNTER — Other Ambulatory Visit: Payer: Self-pay

## 2021-01-29 VITALS — BP 135/87 | HR 119 | Ht 63.0 in | Wt 193.0 lb

## 2021-01-29 DIAGNOSIS — G4701 Insomnia due to medical condition: Secondary | ICD-10-CM | POA: Insufficient documentation

## 2021-01-29 DIAGNOSIS — S52501S Unspecified fracture of the lower end of right radius, sequela: Secondary | ICD-10-CM | POA: Insufficient documentation

## 2021-01-29 DIAGNOSIS — G8918 Other acute postprocedural pain: Secondary | ICD-10-CM | POA: Diagnosis present

## 2021-01-29 DIAGNOSIS — M25571 Pain in right ankle and joints of right foot: Secondary | ICD-10-CM | POA: Insufficient documentation

## 2021-01-29 MED ORDER — GABAPENTIN 300 MG PO CAPS: 300.0000 mg | ORAL_CAPSULE | Freq: Three times a day (TID) | ORAL | 3 refills | Status: DC

## 2021-01-29 MED ORDER — OXYCODONE-ACETAMINOPHEN 5-325 MG PO TABS: 1.0000 | ORAL_TABLET | Freq: Two times a day (BID) | ORAL | 0 refills | Status: DC | PRN

## 2021-01-29 MED ORDER — OXYCODONE-ACETAMINOPHEN 5-325 MG PO TABS
1.0000 | ORAL_TABLET | Freq: Two times a day (BID) | ORAL | 0 refills | Status: DC | PRN
  Filled 2021-01-29: qty 14, 7d supply, fill #0
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
## 2021-02-02 ENCOUNTER — Telehealth: Payer: Self-pay | Admitting: *Deleted

## 2021-02-02 NOTE — Telephone Encounter (Signed)
Patient left a message about her short term disability paperwork.  I called her back.  She states that she was told there was a $25.00 fee for filling out the paperwork and submission.  She states without disability coming in she as no money. She states she is unable to pay the fee.  She is asking for an exception.

## 2021-02-07 ENCOUNTER — Ambulatory Visit: Payer: PRIVATE HEALTH INSURANCE | Admitting: Occupational Therapy

## 2021-02-07 ENCOUNTER — Other Ambulatory Visit: Payer: Self-pay

## 2021-02-07 DIAGNOSIS — R600 Localized edema: Secondary | ICD-10-CM

## 2021-02-07 DIAGNOSIS — M25531 Pain in right wrist: Secondary | ICD-10-CM

## 2021-02-07 DIAGNOSIS — M6281 Muscle weakness (generalized): Secondary | ICD-10-CM

## 2021-02-07 DIAGNOSIS — M25641 Stiffness of right hand, not elsewhere classified: Secondary | ICD-10-CM

## 2021-02-07 DIAGNOSIS — R278 Other lack of coordination: Secondary | ICD-10-CM

## 2021-02-07 DIAGNOSIS — M25631 Stiffness of right wrist, not elsewhere classified: Secondary | ICD-10-CM

## 2021-02-07 DIAGNOSIS — R262 Difficulty in walking, not elsewhere classified: Secondary | ICD-10-CM | POA: Diagnosis not present

## 2021-02-07 NOTE — Patient Instructions (Signed)
Combination Movement (Active)    Keep elbow firmly at side, turn palm up as you move hand towards face (like washing face), turn palm down when coming down towards lap Repeat __10__ times. Do _6___ sessions per day.   AROM: Wrist Extension    With right palm down, bend wrist up. Repeat _10___ times per set.  Do _6___ sessions per day.  Thumb Circles    Move right thumb in a BIG circle. Repeat __10__ times each direction per session. Do __6__ sessions per day.   Opposition (Active)    Touch tip of thumb to nail tip of each finger in turn, making an "O" shape. Can help with other hand Repeat _10___ times. Do __6__ sessions per day.    Flexor Tendon Gliding (Active Hook Fist)   With fingers and knuckles straight, bend middle and tip joints. Do not bend large knuckles. Repeat _10___ times. Do _6___ sessions per day.  MP Flexion (Active)   With back of hand on table, bend large knuckles as far as they will go, keeping small joints straight. Repeat _10_ times. Do __6__ sessions per day. Can bend last 3 digits further with other hand       Finger Flexion / Extension   With palm up, bend fingers of left hand toward palm, making a  fist. Straighten fingers, opening fist. Repeat sequence _10___ times per session. Do  6__ sessions per day.
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
## 2021-02-08 ENCOUNTER — Telehealth: Payer: Self-pay

## 2021-02-08 ENCOUNTER — Encounter: Payer: Self-pay | Admitting: Family

## 2021-02-08 MED ORDER — OXYCODONE-ACETAMINOPHEN 5-325 MG PO TABS: 1.0000 | ORAL_TABLET | Freq: Two times a day (BID) | ORAL | 0 refills | Status: DC | PRN

## 2021-02-08 MED ORDER — APIXABAN 2.5 MG PO TABS: 2.5000 mg | ORAL_TABLET | Freq: Two times a day (BID) | ORAL | 1 refills | Status: DC

## 2021-02-08 NOTE — Telephone Encounter (Signed)
She does not have a follow up appointment scheduled.  Last clinic note stated,"Return if symptoms worsen or fail to improve". She does not have a contract nor was a drug screen performed.  She is applying for disability and had previously left a message that was forwarded to Zorita Pang asking for the $25 dollar filing fee to be waived due to financial hardship.

## 2021-02-08 NOTE — Telephone Encounter (Signed)
We received a prior auth for Eliquis.  In reviewing chart it looks like we did not initially start her on this and her physical med rehab doctor advised her to call the orthopedic doctor to see if they still want her on this. Also you have not seen her since she has been on this.  The refill was sent in.  Do you want to see patient before I complete this prior auth?

## 2021-02-08 NOTE — Telephone Encounter (Signed)
Patient called asking for refills on oxycodone and eliquis

## 2021-02-09 ENCOUNTER — Other Ambulatory Visit (HOSPITAL_BASED_OUTPATIENT_CLINIC_OR_DEPARTMENT_OTHER): Payer: Self-pay

## 2021-02-09 ENCOUNTER — Encounter: Payer: Self-pay | Admitting: *Deleted

## 2021-02-09 NOTE — Telephone Encounter (Signed)
Patient advised she needs to contact Ortho to find out how much longer does she need to be on the medication and to process PA if she needs to continue it. She has an appointment coming up with [place] in June

## 2021-02-09 NOTE — Telephone Encounter (Addendum)
I spoke with insurance this morning and medication does not need PA, patient needs to contact insurance to discuss mail order.  Seem a little weird.  I did not call call pharmacy because was not sure if we were going ok it.  Tried calling her ortho, since we have not heard back but they are closed.

## 2021-02-09 NOTE — Telephone Encounter (Signed)
Patient notified via My Chart message.

## 2021-02-09 NOTE — Telephone Encounter (Signed)
I doubt that ortho will want to continue much longer. Prior auth and refills should come from orthopedics.    Rod Holler- can you please contact pt and let her know that we sent the refill, but she needs to contact her orthopedic doctor to ask them how long she needs to continue the Eliquis.

## 2021-02-12 ENCOUNTER — Other Ambulatory Visit: Payer: Self-pay | Admitting: Physical Medicine and Rehabilitation

## 2021-02-12 ENCOUNTER — Telehealth: Payer: Self-pay

## 2021-02-12 ENCOUNTER — Other Ambulatory Visit: Payer: Self-pay

## 2021-02-12 MED ORDER — OXYCODONE-ACETAMINOPHEN 5-325 MG PO TABS: 1.0000 | ORAL_TABLET | Freq: Two times a day (BID) | ORAL | 0 refills | Status: DC | PRN

## 2021-02-12 NOTE — Telephone Encounter (Signed)
Will you please re-send Joanna Wright's Oxycodone 5-325. Per pharmacy it was not received on 02/08/2021.   Thank you

## 2021-02-12 NOTE — Telephone Encounter (Signed)
Just sent

## 2021-02-13 ENCOUNTER — Ambulatory Visit: Payer: PRIVATE HEALTH INSURANCE | Admitting: Occupational Therapy

## 2021-02-13 ENCOUNTER — Encounter: Payer: Self-pay | Admitting: Occupational Therapy

## 2021-02-13 ENCOUNTER — Other Ambulatory Visit: Payer: Self-pay | Admitting: Physical Medicine and Rehabilitation

## 2021-02-13 ENCOUNTER — Other Ambulatory Visit: Payer: Self-pay

## 2021-02-13 DIAGNOSIS — M25531 Pain in right wrist: Secondary | ICD-10-CM

## 2021-02-13 DIAGNOSIS — R600 Localized edema: Secondary | ICD-10-CM

## 2021-02-13 DIAGNOSIS — R278 Other lack of coordination: Secondary | ICD-10-CM

## 2021-02-13 DIAGNOSIS — M6281 Muscle weakness (generalized): Secondary | ICD-10-CM

## 2021-02-13 DIAGNOSIS — M25641 Stiffness of right hand, not elsewhere classified: Secondary | ICD-10-CM

## 2021-02-13 DIAGNOSIS — R262 Difficulty in walking, not elsewhere classified: Secondary | ICD-10-CM | POA: Diagnosis not present

## 2021-02-13 DIAGNOSIS — M25631 Stiffness of right wrist, not elsewhere classified: Secondary | ICD-10-CM

## 2021-02-13 MED ORDER — OXYCODONE-ACETAMINOPHEN 5-325 MG PO TABS: 1.0000 | ORAL_TABLET | Freq: Two times a day (BID) | ORAL | 0 refills | Status: DC | PRN
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
## 2021-02-13 NOTE — Patient Instructions (Addendum)
PROM: Finger MP Joints   Passively bend ________ finger of hand at big knuckle until stretch is felt. Hold _10___ seconds. Relax. Straighten finger as far as possible. Repeat __5__ times per set.  Do __4-6__ sessions per day.   PIP Flexion (Passive)   Use other hand to bend the middle joint of ______ finger down as far as possible. Hold _10___ seconds. Repeat __5__ times. Do _4-6___ sessions per day.    PROM: Finger DIP Joints   Passively bend ________ finger(s) of  hand at tip joint until stretch is felt. Hold __10__ seconds. Relax. Straighten finger as far as possible. Repeat _5___ times per set.  Do __4-6__ sessions per day.  Copyright  VHI. All rights reserved.    Supination (Passive)    Keep elbow bent at right angle and held firmly at side. Use other hand to turn forearm until palm faces upward. Hold _20_ seconds. Repeat _10_ times. Do 2-3_ sessions per day.  Extension (Passive)    Keep palm on table, using other hand on top to assist. Raise elbow. Hold 20__ seconds. Repeat _10_ times. Do 2-3_ sessions per day.

## 2021-02-20 ENCOUNTER — Ambulatory Visit: Payer: PRIVATE HEALTH INSURANCE | Admitting: Occupational Therapy

## 2021-02-20 ENCOUNTER — Other Ambulatory Visit: Payer: Self-pay

## 2021-02-20 DIAGNOSIS — R278 Other lack of coordination: Secondary | ICD-10-CM

## 2021-02-20 DIAGNOSIS — M25641 Stiffness of right hand, not elsewhere classified: Secondary | ICD-10-CM

## 2021-02-20 DIAGNOSIS — R262 Difficulty in walking, not elsewhere classified: Secondary | ICD-10-CM | POA: Diagnosis not present

## 2021-02-20 DIAGNOSIS — M25631 Stiffness of right wrist, not elsewhere classified: Secondary | ICD-10-CM
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
## 2021-02-27 ENCOUNTER — Ambulatory Visit: Payer: Self-pay | Admitting: Student

## 2021-02-27 DIAGNOSIS — S62101A Fracture of unspecified carpal bone, right wrist, initial encounter for closed fracture: Secondary | ICD-10-CM | POA: Insufficient documentation

## 2021-02-28 ENCOUNTER — Other Ambulatory Visit: Payer: Self-pay

## 2021-02-28 ENCOUNTER — Encounter (HOSPITAL_COMMUNITY): Payer: Self-pay | Admitting: Student

## 2021-02-28 ENCOUNTER — Ambulatory Visit: Payer: No Typology Code available for payment source | Attending: Physician Assistant | Admitting: Occupational Therapy

## 2021-02-28 DIAGNOSIS — M25641 Stiffness of right hand, not elsewhere classified: Secondary | ICD-10-CM | POA: Diagnosis not present

## 2021-02-28 DIAGNOSIS — R278 Other lack of coordination: Secondary | ICD-10-CM

## 2021-02-28 DIAGNOSIS — M25672 Stiffness of left ankle, not elsewhere classified: Secondary | ICD-10-CM | POA: Diagnosis present

## 2021-02-28 DIAGNOSIS — M25631 Stiffness of right wrist, not elsewhere classified: Secondary | ICD-10-CM | POA: Diagnosis present

## 2021-02-28 DIAGNOSIS — R2689 Other abnormalities of gait and mobility: Secondary | ICD-10-CM | POA: Insufficient documentation

## 2021-02-28 DIAGNOSIS — M6281 Muscle weakness (generalized): Secondary | ICD-10-CM

## 2021-02-28 DIAGNOSIS — R600 Localized edema: Secondary | ICD-10-CM

## 2021-02-28 DIAGNOSIS — R262 Difficulty in walking, not elsewhere classified: Secondary | ICD-10-CM | POA: Diagnosis present

## 2021-02-28 DIAGNOSIS — M25531 Pain in right wrist: Secondary | ICD-10-CM | POA: Diagnosis present

## 2021-02-28 NOTE — Progress Notes (Signed)
DUE TO COVID-19 ONLY ONE VISITOR IS ALLOWED TO COME WITH YOU AND STAY IN THE WAITING ROOM ONLY DURING PRE OP AND PROCEDURE DAY OF SURGERY.   PCP - [name], NP Cardiologist - n/a  Chest x-ray - 01/12/21 (2V) EKG - 01/12/21 Stress Test - n/a ECHO - n/a Cardiac Cath - n/a  STOP now taking any Aspirin (unless otherwise instructed by your surgeon), Aleve, Naproxen, Ibuprofen, Motrin, Advil, Goody's, BC's, all herbal medications, fish oil, and all vitamins.   Coronavirus Screening Do you have any of the following symptoms:  Cough yes/no: No Fever (>100.58F)  yes/no: No Runny nose yes/no: No Sore throat yes/no: No Difficulty breathing/shortness of breath  yes/no: No  Have you traveled in the last 14 days and where? yes/no: No  Patient verbalized understanding of instructions that were given via phone.
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
## 2021-03-02 ENCOUNTER — Ambulatory Visit (HOSPITAL_COMMUNITY): Payer: PRIVATE HEALTH INSURANCE | Admitting: Anesthesiology

## 2021-03-02 ENCOUNTER — Encounter (HOSPITAL_COMMUNITY)
Admission: RE | Disposition: A | Discharge status: Home / Self Care | Payer: Self-pay | Source: Home / Self Care | Attending: Student

## 2021-03-02 ENCOUNTER — Ambulatory Visit (HOSPITAL_COMMUNITY): Payer: PRIVATE HEALTH INSURANCE

## 2021-03-02 ENCOUNTER — Encounter (HOSPITAL_COMMUNITY): Payer: Self-pay | Admitting: Student

## 2021-03-02 ENCOUNTER — Ambulatory Visit (HOSPITAL_COMMUNITY)
Admission: RE | Admit: 2021-03-02 | Discharge: 2021-03-02 | Disposition: A | Discharge status: Home / Self Care | Payer: PRIVATE HEALTH INSURANCE | Attending: Student | Admitting: Student

## 2021-03-02 ENCOUNTER — Other Ambulatory Visit: Payer: Self-pay

## 2021-03-02 DIAGNOSIS — Z419 Encounter for procedure for purposes other than remedying health state, unspecified: Secondary | ICD-10-CM

## 2021-03-02 DIAGNOSIS — Z8249 Family history of ischemic heart disease and other diseases of the circulatory system: Secondary | ICD-10-CM | POA: Insufficient documentation

## 2021-03-02 DIAGNOSIS — I10 Essential (primary) hypertension: Secondary | ICD-10-CM | POA: Insufficient documentation

## 2021-03-02 DIAGNOSIS — S82872A Displaced pilon fracture of left tibia, initial encounter for closed fracture: Secondary | ICD-10-CM | POA: Diagnosis not present

## 2021-03-02 DIAGNOSIS — S62101A Fracture of unspecified carpal bone, right wrist, initial encounter for closed fracture: Secondary | ICD-10-CM | POA: Insufficient documentation

## 2021-03-02 DIAGNOSIS — S52571A Other intraarticular fracture of lower end of right radius, initial encounter for closed fracture: Secondary | ICD-10-CM | POA: Diagnosis not present

## 2021-03-02 HISTORY — PX: HARDWARE REMOVAL: SHX979

## 2021-03-02 HISTORY — DX: Essential (primary) hypertension: I10

## 2021-03-02 LAB — BASIC METABOLIC PANEL
Anion gap: 10 (ref 5–15)
BUN: 7 mg/dL (ref 6–20)
CO2: 27 mmol/L (ref 22–32)
Calcium: 9.7 mg/dL (ref 8.9–10.3)
Chloride: 103 mmol/L (ref 98–111)
Creatinine, Ser: 0.81 mg/dL (ref 0.44–1.00)
GFR, Estimated: >60 mL/min (ref >60)
Glucose, Bld: 107 mg/dL — ABNORMAL HIGH (ref 70–99)
Potassium: 3.5 mmol/L (ref 3.5–5.1)
Sodium: 140 mmol/L (ref 135–145)

## 2021-03-02 LAB — CBC
HCT: 37.4 % (ref 36.0–46.0)
Hemoglobin: 12.3 g/dL (ref 12.0–15.0)
MCH: 31.4 pg (ref 26.0–34.0)
MCHC: 32.9 g/dL (ref 30.0–36.0)
MCV: 95.4 fL (ref 80.0–100.0)
Platelets: 282 10*3/uL (ref 150–400)
RBC: 3.92 MIL/uL (ref 3.87–5.11)
RDW: 11.9 % (ref 11.5–15.5)
WBC: 4.1 10*3/uL (ref 4.0–10.5)
nRBC: 0 % (ref 0.0–0.2)

## 2021-03-02 LAB — POCT PREGNANCY, URINE: Preg Test, Ur: NEGATIVE

## 2021-03-02 SURGERY — REMOVAL, HARDWARE
Anesthesia: General | Site: Wrist | Laterality: Right

## 2021-03-02 MED ORDER — MEPERIDINE HCL 25 MG/ML IJ SOLN: 6.2500 mg | INTRAMUSCULAR | Status: DC | PRN

## 2021-03-02 MED ORDER — LACTATED RINGERS IV SOLN: INTRAVENOUS | Status: DC

## 2021-03-02 MED ORDER — ONDANSETRON HCL 4 MG/2ML IJ SOLN
INTRAMUSCULAR | Status: AC
  Filled 2021-03-02: qty 2

## 2021-03-02 MED ORDER — CHLORHEXIDINE GLUCONATE 0.12 % MT SOLN: 15.0000 mL | Freq: Once | OROMUCOSAL | Status: AC

## 2021-03-02 MED ORDER — PROPOFOL 10 MG/ML IV BOLUS
INTRAVENOUS | Status: DC | PRN
  Administered 2021-03-02: 200 mg via INTRAVENOUS

## 2021-03-02 MED ORDER — OXYCODONE HCL 5 MG PO TABS: 5.0000 mg | ORAL_TABLET | Freq: Once | ORAL | Status: DC | PRN

## 2021-03-02 MED ORDER — CEFAZOLIN SODIUM-DEXTROSE 2-4 GM/100ML-% IV SOLN
2.0000 g | INTRAVENOUS | Status: AC
  Administered 2021-03-02: 2 g via INTRAVENOUS

## 2021-03-02 MED ORDER — LIDOCAINE 2% (20 MG/ML) 5 ML SYRINGE
INTRAMUSCULAR | Status: DC | PRN
  Administered 2021-03-02: 60 mg via INTRAVENOUS

## 2021-03-02 MED ORDER — AMISULPRIDE (ANTIEMETIC) 5 MG/2ML IV SOLN: 10.0000 mg | Freq: Once | INTRAVENOUS | Status: DC | PRN

## 2021-03-02 MED ORDER — ORAL CARE MOUTH RINSE: 15.0000 mL | Freq: Once | OROMUCOSAL | Status: AC

## 2021-03-02 MED ORDER — BUPIVACAINE-EPINEPHRINE (PF) 0.25% -1:200000 IJ SOLN
INTRAMUSCULAR | Status: AC
  Filled 2021-03-02: qty 30

## 2021-03-02 MED ORDER — MIDAZOLAM HCL 5 MG/5ML IJ SOLN
INTRAMUSCULAR | Status: DC | PRN
  Administered 2021-03-02: 2 mg via INTRAVENOUS

## 2021-03-02 MED ORDER — FENTANYL CITRATE (PF) 250 MCG/5ML IJ SOLN
INTRAMUSCULAR | Status: DC | PRN
  Administered 2021-03-02 (×5): 50 ug via INTRAVENOUS

## 2021-03-02 MED ORDER — CEFAZOLIN SODIUM-DEXTROSE 2-4 GM/100ML-% IV SOLN
INTRAVENOUS | Status: AC
  Filled 2021-03-02: qty 100

## 2021-03-02 MED ORDER — ROCURONIUM BROMIDE 10 MG/ML (PF) SYRINGE
PREFILLED_SYRINGE | INTRAVENOUS | Status: AC
  Filled 2021-03-02: qty 10

## 2021-03-02 MED ORDER — OXYCODONE HCL 5 MG/5ML PO SOLN: 5.0000 mg | Freq: Once | ORAL | Status: DC | PRN

## 2021-03-02 MED ORDER — CHLORHEXIDINE GLUCONATE 0.12 % MT SOLN
OROMUCOSAL | Status: AC
  Administered 2021-03-02: 15 mL via OROMUCOSAL
  Filled 2021-03-02: qty 15

## 2021-03-02 MED ORDER — PROMETHAZINE HCL 25 MG/ML IJ SOLN: 6.2500 mg | INTRAMUSCULAR | Status: DC | PRN

## 2021-03-02 MED ORDER — MIDAZOLAM HCL 2 MG/2ML IJ SOLN
INTRAMUSCULAR | Status: AC
  Filled 2021-03-02: qty 2

## 2021-03-02 MED ORDER — FENTANYL CITRATE (PF) 250 MCG/5ML IJ SOLN
INTRAMUSCULAR | Status: AC
  Filled 2021-03-02: qty 5

## 2021-03-02 MED ORDER — OXYCODONE-ACETAMINOPHEN 5-325 MG PO TABS: 1.0000 | ORAL_TABLET | Freq: Four times a day (QID) | ORAL | 0 refills | Status: DC | PRN

## 2021-03-02 MED ORDER — HYDROMORPHONE HCL 1 MG/ML IJ SOLN
INTRAMUSCULAR | Status: AC
  Filled 2021-03-02: qty 1

## 2021-03-02 MED ORDER — METHOCARBAMOL 500 MG PO TABS: 1000.0000 mg | ORAL_TABLET | Freq: Two times a day (BID) | ORAL | 0 refills | Status: DC

## 2021-03-02 MED ORDER — BUPIVACAINE-EPINEPHRINE (PF) 0.25% -1:200000 IJ SOLN
INTRAMUSCULAR | Status: DC | PRN
  Administered 2021-03-02: 10 mL via PERINEURAL

## 2021-03-02 MED ORDER — ONDANSETRON HCL 4 MG/2ML IJ SOLN
INTRAMUSCULAR | Status: DC | PRN
  Administered 2021-03-02: 4 mg via INTRAVENOUS

## 2021-03-02 MED ORDER — LIDOCAINE HCL (PF) 2 % IJ SOLN
INTRAMUSCULAR | Status: AC
  Filled 2021-03-02: qty 5

## 2021-03-02 MED ORDER — DEXAMETHASONE SODIUM PHOSPHATE 10 MG/ML IJ SOLN
INTRAMUSCULAR | Status: DC | PRN
  Administered 2021-03-02: 10 mg via INTRAVENOUS

## 2021-03-02 MED ORDER — HYDROMORPHONE HCL 1 MG/ML IJ SOLN
0.2500 mg | INTRAMUSCULAR | Status: DC | PRN
  Administered 2021-03-02 (×2): 0.5 mg via INTRAVENOUS

## 2021-03-02 SURGICAL SUPPLY — 56 items
BANDAGE ESMARK 6X9 LF (GAUZE/BANDAGES/DRESSINGS) ×1 IMPLANT
BNDG COHESIVE 6X5 TAN STRL LF (GAUZE/BANDAGES/DRESSINGS) ×3 IMPLANT
BNDG ELASTIC 4X5.8 VLCR STR LF (GAUZE/BANDAGES/DRESSINGS) ×3 IMPLANT
BNDG ELASTIC 6X5.8 VLCR STR LF (GAUZE/BANDAGES/DRESSINGS) ×3 IMPLANT
BNDG ESMARK 6X9 LF (GAUZE/BANDAGES/DRESSINGS) ×3
BNDG GAUZE ELAST 4 BULKY (GAUZE/BANDAGES/DRESSINGS) ×6 IMPLANT
BRUSH SCRUB EZ PLAIN DRY (MISCELLANEOUS) ×3 IMPLANT
CHLORAPREP W/TINT 26 (MISCELLANEOUS) ×3 IMPLANT
CLOSURE WOUND 1/2 X4 (GAUZE/BANDAGES/DRESSINGS)
COVER SURGICAL LIGHT HANDLE (MISCELLANEOUS) ×6 IMPLANT
COVER WAND RF STERILE (DRAPES) ×3 IMPLANT
DRAPE C-ARM 42X72 X-RAY (DRAPES) ×3 IMPLANT
DRAPE C-ARMOR (DRAPES) IMPLANT
DRAPE U-SHAPE 47X51 STRL (DRAPES) ×3 IMPLANT
DRSG ADAPTIC 3X8 NADH LF (GAUZE/BANDAGES/DRESSINGS) ×3 IMPLANT
ELECT REM PT RETURN 9FT ADLT (ELECTROSURGICAL) ×3
ELECTRODE REM PT RTRN 9FT ADLT (ELECTROSURGICAL) ×1 IMPLANT
GAUZE SPONGE 4X4 12PLY STRL (GAUZE/BANDAGES/DRESSINGS) ×3 IMPLANT
GLOVE BIO SURGEON STRL SZ 6.5 (GLOVE) ×6 IMPLANT
GLOVE BIO SURGEON STRL SZ7.5 (GLOVE) ×12 IMPLANT
GLOVE BIO SURGEONS STRL SZ 6.5 (GLOVE) ×3
GLOVE BIOGEL PI IND STRL 7.5 (GLOVE) ×1 IMPLANT
GLOVE BIOGEL PI INDICATOR 7.5 (GLOVE) ×2
GLOVE SURG UNDER POLY LF SZ6.5 (GLOVE) ×3 IMPLANT
GOWN STRL REUS W/ TWL LRG LVL3 (GOWN DISPOSABLE) ×2 IMPLANT
GOWN STRL REUS W/TWL LRG LVL3 (GOWN DISPOSABLE) ×6
KIT BASIN OR (CUSTOM PROCEDURE TRAY) ×3 IMPLANT
KIT TURNOVER KIT B (KITS) ×3 IMPLANT
MANIFOLD NEPTUNE II (INSTRUMENTS) ×3 IMPLANT
NEEDLE 22X1 1/2 (OR ONLY) (NEEDLE) ×3 IMPLANT
NS IRRIG 1000ML POUR BTL (IV SOLUTION) IMPLANT
PACK ORTHO EXTREMITY (CUSTOM PROCEDURE TRAY) ×3 IMPLANT
PAD ARMBOARD 7.5X6 YLW CONV (MISCELLANEOUS) ×6 IMPLANT
PADDING CAST COTTON 6X4 STRL (CAST SUPPLIES) ×9 IMPLANT
SPONGE LAP 18X18 RF (DISPOSABLE) ×3 IMPLANT
STAPLER VISISTAT 35W (STAPLE) IMPLANT
STOCKINETTE IMPERVIOUS LG (DRAPES) ×3 IMPLANT
STRIP CLOSURE SKIN 1/2X4 (GAUZE/BANDAGES/DRESSINGS) IMPLANT
SUCTION FRAZIER HANDLE 10FR (MISCELLANEOUS)
SUCTION TUBE FRAZIER 10FR DISP (MISCELLANEOUS) IMPLANT
SUT ETHILON 3 0 PS 1 (SUTURE) IMPLANT
SUT MNCRL AB 3-0 PS2 18 (SUTURE) ×3 IMPLANT
SUT MON AB 2-0 CT1 36 (SUTURE) ×3 IMPLANT
SUT PDS AB 2-0 CT1 27 (SUTURE) IMPLANT
SUT VIC AB 0 CT1 27 (SUTURE)
SUT VIC AB 0 CT1 27XBRD ANBCTR (SUTURE) IMPLANT
SUT VIC AB 2-0 CT1 27 (SUTURE)
SUT VIC AB 2-0 CT1 TAPERPNT 27 (SUTURE) IMPLANT
SYR CONTROL 10ML LL (SYRINGE) ×3 IMPLANT
TOWEL GREEN STERILE (TOWEL DISPOSABLE) ×6 IMPLANT
TOWEL GREEN STERILE FF (TOWEL DISPOSABLE) ×6 IMPLANT
TUBE CONNECTING 12'X1/4 (SUCTIONS) ×1
TUBE CONNECTING 12X1/4 (SUCTIONS) ×2 IMPLANT
UNDERPAD 30X36 HEAVY ABSORB (UNDERPADS AND DIAPERS) ×3 IMPLANT
WATER STERILE IRR 1000ML POUR (IV SOLUTION) ×6 IMPLANT
YANKAUER SUCT BULB TIP NO VENT (SUCTIONS) ×3 IMPLANT

## 2021-03-02 NOTE — Anesthesia Procedure Notes (Signed)
Procedure Name: LMA Insertion Date/Time: 03/02/2021 8:35 AM Performed by: Kyung Rudd, CRNA Pre-anesthesia Checklist: Patient identified, Emergency Drugs available, Suction available and Patient being monitored Patient Re-evaluated:Patient Re-evaluated prior to induction Oxygen Delivery Method: Circle System Utilized Preoxygenation: Pre-oxygenation with 100% oxygen Induction Type: IV induction Ventilation: Mask ventilation without difficulty LMA: LMA inserted LMA Size: 4.0 Number of attempts: 1 Placement Confirmation: positive ETCO2 Tube secured with: Tape Dental Injury: Teeth and Oropharynx as per pre-operative assessment

## 2021-03-02 NOTE — Interval H&P Note (Signed)
History and Physical Interval Note:  03/02/2021 7:13 AM  Joanna Wright  has presented today for surgery, with the diagnosis of Wrist fracture.  The various methods of treatment have been discussed with the patient and family. After consideration of risks, benefits and other options for treatment, the patient has consented to  Procedure(s): HARDWARE REMOVAL RIGHT ARM AND MANIPULATION OF LEFT ANKLE (Right) as a surgical intervention.  The patient's history has been reviewed, patient examined, no change in status, stable for surgery.  I have reviewed the patient's chart and labs.  Questions were answered to the patient's satisfaction.     Lennette Bihari P Lorene Klimas
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
## 2021-03-02 NOTE — Discharge Instructions (Addendum)
Orthopaedic Trauma Service Discharge Instructions   General Discharge Instructions  WEIGHT BEARING STATUS:  - 50% partial weightbearing left lower extremity in cam boot - Weightbearing as tolerated through right wrist and removable wrist brace  RANGE OF MOTION/ACTIVITY: Unrestricted range of motion of left ankle.  Okay to come out of brace on left wrist for unrestricted range of motion as tolerated  Wound Care: You may remove your surgical dressing from your right wrist on Sunday, 03/04/2021.  Incisions can be left open to air if there is no drainage. If incision continues to have drainage, follow wound care instructions below. Okay to shower if no drainage from incisions.  DVT/PE prophylaxis: None  Diet: as you were eating previously.  Can use over the counter stool softeners and bowel preparations, such as Miralax, to help with bowel movements.  Narcotics can be constipating.  Be sure to drink plenty of fluids  PAIN MEDICATION USE AND EXPECTATIONS  You have likely been given narcotic medications to help control your pain.  After a traumatic event that results in an fracture (broken bone) with or without surgery, it is ok to use narcotic pain medications to help control one's pain.  We understand that everyone responds to pain differently and each individual patient will be evaluated on a regular basis for the continued need for narcotic medications. Ideally, narcotic medication use should last no more than 6-8 weeks (coinciding with fracture healing).   As a patient it is your responsibility as well to monitor narcotic medication use and report the amount and frequency you use these medications when you come to your office visit.   We would also advise that if you are using narcotic medications, you should take a dose prior to therapy to maximize you participation.  IF YOU ARE ON NARCOTIC MEDICATIONS IT IS NOT PERMISSIBLE TO OPERATE A MOTOR VEHICLE (MOTORCYCLE/CAR/TRUCK/MOPED) OR HEAVY  MACHINERY DO NOT MIX NARCOTICS WITH OTHER CNS (CENTRAL NERVOUS SYSTEM) DEPRESSANTS SUCH AS ALCOHOL   STOP SMOKING OR USING NICOTINE PRODUCTS!!!!  As discussed nicotine severely impairs your body's ability to heal surgical and traumatic wounds but also impairs bone healing.  Wounds and bone heal by forming microscopic blood vessels (angiogenesis) and nicotine is a vasoconstrictor (essentially, shrinks blood vessels).  Therefore, if vasoconstriction occurs to these microscopic blood vessels they essentially disappear and are unable to deliver necessary nutrients to the healing tissue.  This is one modifiable factor that you can do to dramatically increase your chances of healing your injury.    (This means no smoking, no nicotine gum, patches, etc)  DO NOT USE NONSTEROIDAL ANTI-INFLAMMATORY DRUGS (NSAID'S)  Using products such as Advil (ibuprofen), Aleve (naproxen), Motrin (ibuprofen) for additional pain control during fracture healing can delay and/or prevent the healing response.  If you would like to take over the counter (OTC) medication, Tylenol (acetaminophen) is ok.  However, some narcotic medications that are given for pain control contain acetaminophen as well. Therefore, you should not exceed more than 4000 mg of tylenol in a day if you do not have liver disease.  Also note that there are may OTC medicines, such as cold medicines and allergy medicines that my contain tylenol as well.  If you have any questions about medications and/or interactions please ask your doctor/PA or your pharmacist.      ICE AND ELEVATE INJURED/OPERATIVE EXTREMITY  Using ice and elevating the injured extremity above your heart can help with swelling and pain control.  Icing in a pulsatile fashion,  such as 20 minutes on and 20 minutes off, can be followed.    Do not place ice directly on skin. Make sure there is a barrier between to skin and the ice pack.    Using frozen items such as frozen peas works well as the  conform nicely to the are that needs to be iced.  USE AN ACE WRAP OR TED HOSE FOR SWELLING CONTROL  In addition to icing and elevation, Ace wraps or TED hose are used to help limit and resolve swelling.  It is recommended to use Ace wraps or TED hose until you are informed to stop.    When using Ace Wraps start the wrapping distally (farthest away from the body) and wrap proximally (closer to the body)   Example: If you had surgery on your leg or thing and you do not have a splint on, start the ace wrap at the toes and work your way up to the thigh        If you had surgery on your upper extremity and do not have a splint on, start the ace wrap at your fingers and work your way up to the upper arm  IF YOU ARE IN A CAM BOOT (BLACK BOOT)  You may remove boot periodically. Perform daily dressing changes as noted below.  Wash the liner of the boot regularly and wear a sock when wearing the boot. It is recommended that you sleep in the boot until told otherwise   CALL THE OFFICE WITH ANY QUESTIONS OR CONCERNS: [phone]   VISIT OUR WEBSITE FOR ADDITIONAL INFORMATION: [email or web address]    Discharge Wound Care Instructions  Do NOT apply any ointments, solutions or lotions to pin sites or surgical wounds.  These prevent needed drainage and even though solutions like hydrogen peroxide kill bacteria, they also damage cells lining the pin sites that help fight infection.  Applying lotions or ointments can keep the wounds moist and can cause them to breakdown and open up as well. This can increase the risk for infection. When in doubt call the office.  If any drainage is noted, use one layer of adaptic, then gauze, Kerlix, and an ace wrap.  Once the incision is completely dry and without drainage, it may be left open to air out.  Showering may begin 36-48 hours later.  Cleaning gently with soap and water.
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
## 2021-03-02 NOTE — Anesthesia Postprocedure Evaluation (Signed)
Anesthesia Post Note  Patient: Joanna Wright  Procedure(s) Performed: HARDWARE REMOVAL RIGHT ARM AND MANIPULATION OF LEFT ANKLE (Right: Wrist)     Patient location during evaluation: PACU Anesthesia Type: General Level of consciousness: awake and alert Pain management: pain level controlled Vital Signs Assessment: post-procedure vital signs reviewed and stable Respiratory status: spontaneous breathing, nonlabored ventilation and respiratory function stable Cardiovascular status: blood pressure returned to baseline and stable Postop Assessment: no apparent nausea or vomiting Anesthetic complications: no   No notable events documented.  Last Vitals:  Vitals:   03/02/21 1015 03/02/21 1029  BP: (!) 150/91 (!) 157/91  Pulse: 84 87  Resp: 14 15  Temp:    SpO2: 95% 97%    Last Pain:  Vitals:   03/02/21 1000  PainSc: Asleep                 Lynda Rainwater

## 2021-03-02 NOTE — Transfer of Care (Signed)
Immediate Anesthesia Transfer of Care Note  Patient: Joanna Wright  Procedure(s) Performed: HARDWARE REMOVAL RIGHT ARM AND MANIPULATION OF LEFT ANKLE (Right: Wrist)  Patient Location: PACU  Anesthesia Type:General  Level of Consciousness: awake, alert  and oriented  Airway & Oxygen Therapy: Patient Spontanous Breathing  Post-op Assessment: Report given to RN, Post -op Vital signs reviewed and stable and Patient moving all extremities X 4  Post vital signs: Reviewed and stable  Last Vitals:  Vitals Value Taken Time  BP    Temp 36.7 C 03/02/21 0930  Pulse 113 03/02/21 0931  Resp 18 03/02/21 0931  SpO2 97 % 03/02/21 0931  Vitals shown include unvalidated device data.  Last Pain: There were no vitals filed for this visit.       Complications: No notable events documented.

## 2021-03-02 NOTE — Anesthesia Preprocedure Evaluation (Signed)
Anesthesia Evaluation  Patient identified by MRN, date of birth, ID band Patient awake    Reviewed: Allergy & Precautions, NPO status , Patient's Chart, lab work & pertinent test results  Airway Mallampati: II  TM Distance: >3 FB Neck ROM: Full    Dental  (+) Dental Advisory Given   Pulmonary neg pulmonary ROS,    breath sounds clear to auscultation       Cardiovascular hypertension, Pt. on medications  Rhythm:Regular Rate:Normal     Neuro/Psych negative neurological ROS     GI/Hepatic negative GI ROS, Neg liver ROS,   Endo/Other  negative endocrine ROS  Renal/GU negative Renal ROS     Musculoskeletal   Abdominal (+) + obese,   Peds  Hematology  (+) anemia ,   Anesthesia Other Findings   Reproductive/Obstetrics                             Lab Results  Component Value Date   WBC 4.1 03/02/2021   HGB 12.3 03/02/2021   HCT 37.4 03/02/2021   MCV 95.4 03/02/2021   PLT 282 03/02/2021   Lab Results  Component Value Date   CREATININE 0.78 01/12/2021   BUN 7 01/12/2021   NA 136 01/12/2021   K 3.7 01/12/2021   CL 102 01/12/2021   CO2 24 01/12/2021    Anesthesia Physical  Anesthesia Plan  ASA: II  Anesthesia Plan: General   Post-op Pain Management:    Induction: Intravenous  PONV Risk Score and Plan: 3 and Dexamethasone, Ondansetron, Midazolam and Treatment may vary due to age or medical condition  Airway Management Planned: LMA  Additional Equipment:   Intra-op Plan:   Post-operative Plan: Extubation in OR  Informed Consent: I have reviewed the patients History and Physical, chart, labs and discussed the procedure including the risks, benefits and alternatives for the proposed anesthesia with the patient or authorized representative who has indicated his/her understanding and acceptance.     Dental advisory given  Plan Discussed with:   Anesthesia Plan Comments:          Anesthesia Quick Evaluation

## 2021-03-05 ENCOUNTER — Encounter (HOSPITAL_COMMUNITY): Payer: Self-pay | Admitting: Student

## 2021-03-06 ENCOUNTER — Encounter: Payer: PRIVATE HEALTH INSURANCE | Admitting: Family

## 2021-03-07 ENCOUNTER — Other Ambulatory Visit: Payer: Self-pay

## 2021-03-07 ENCOUNTER — Encounter: Payer: Self-pay | Admitting: *Deleted

## 2021-03-07 ENCOUNTER — Ambulatory Visit: Payer: No Typology Code available for payment source | Admitting: Occupational Therapy

## 2021-03-07 DIAGNOSIS — R278 Other lack of coordination: Secondary | ICD-10-CM

## 2021-03-07 DIAGNOSIS — M6281 Muscle weakness (generalized): Secondary | ICD-10-CM

## 2021-03-07 DIAGNOSIS — M25641 Stiffness of right hand, not elsewhere classified: Secondary | ICD-10-CM | POA: Diagnosis not present

## 2021-03-07 DIAGNOSIS — M25631 Stiffness of right wrist, not elsewhere classified: Secondary | ICD-10-CM

## 2021-03-07 DIAGNOSIS — M25531 Pain in right wrist: Secondary | ICD-10-CM

## 2021-03-07 DIAGNOSIS — R600 Localized edema: Secondary | ICD-10-CM

## 2021-03-07 NOTE — Patient Instructions (Signed)
Wrist Flexion / Extension    Hold elbows at 90 and close to body, with palms down, or rest on your elbow on table top.  Bend wrist so fingers point up. Then bend wrist so fingers point down. Repeat sequence __10-15__ times per session. 4-6x/day. Hand Variation: Thumbs up   Ulnar Deviation (Active)    With fingers extended flat on table, bend hand to side at wrist in direction of little finger and hold for 3-5 seconds. Repeat __10-15__ times. Do __4-6__ sessions per day.   Radial Deviation (Active With Finger Flexion)    With fingers extended flat on table, bend wrist toward thumb side and hold 3-5 seconds. Repeat _10-15___ times. Do _4-6__ sessions per day.  Pronation (Active)    With elbow held at side, wrist straight and palm facing up, turn until palm faces down completely. Hold _3-5__ seconds. Repeat __10__ times. Do __4-6__ sessions per day. Activity: Use this motion to turn pages of a book.*  Supination (Active)    With elbow held at right angle and kept at side, turn palm upward as far as possible. Hold __3-5__ seconds. Repeat __10__ times. Do __4-6__ sessions per day. Activity: Use this motion when turning cards over.*  Copyright  VHI. All rights reserved.
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
## 2021-03-14 ENCOUNTER — Encounter: Payer: Self-pay | Admitting: Family

## 2021-03-14 ENCOUNTER — Other Ambulatory Visit: Payer: Self-pay

## 2021-03-14 ENCOUNTER — Ambulatory Visit (INDEPENDENT_AMBULATORY_CARE_PROVIDER_SITE_OTHER): Payer: No Typology Code available for payment source | Admitting: Family

## 2021-03-14 ENCOUNTER — Ambulatory Visit: Payer: No Typology Code available for payment source

## 2021-03-14 ENCOUNTER — Telehealth: Payer: Self-pay

## 2021-03-14 ENCOUNTER — Encounter: Payer: Self-pay | Admitting: *Deleted

## 2021-03-14 ENCOUNTER — Ambulatory Visit: Payer: No Typology Code available for payment source | Admitting: Occupational Therapy

## 2021-03-14 VITALS — BP 150/84 | HR 102 | Temp 99.3°F | Resp 16 | Ht 63.0 in | Wt 193.0 lb

## 2021-03-14 DIAGNOSIS — M25631 Stiffness of right wrist, not elsewhere classified: Secondary | ICD-10-CM

## 2021-03-14 DIAGNOSIS — E785 Hyperlipidemia, unspecified: Secondary | ICD-10-CM

## 2021-03-14 DIAGNOSIS — I10 Essential (primary) hypertension: Secondary | ICD-10-CM

## 2021-03-14 DIAGNOSIS — M25641 Stiffness of right hand, not elsewhere classified: Secondary | ICD-10-CM

## 2021-03-14 DIAGNOSIS — R262 Difficulty in walking, not elsewhere classified: Secondary | ICD-10-CM

## 2021-03-14 DIAGNOSIS — M25531 Pain in right wrist: Secondary | ICD-10-CM

## 2021-03-14 DIAGNOSIS — R600 Localized edema: Secondary | ICD-10-CM

## 2021-03-14 DIAGNOSIS — R278 Other lack of coordination: Secondary | ICD-10-CM

## 2021-03-14 DIAGNOSIS — M6281 Muscle weakness (generalized): Secondary | ICD-10-CM

## 2021-03-14 DIAGNOSIS — E559 Vitamin D deficiency, unspecified: Secondary | ICD-10-CM

## 2021-03-14 DIAGNOSIS — M25672 Stiffness of left ankle, not elsewhere classified: Secondary | ICD-10-CM

## 2021-03-14 DIAGNOSIS — Z0000 Encounter for general adult medical examination without abnormal findings: Secondary | ICD-10-CM | POA: Diagnosis not present

## 2021-03-14 DIAGNOSIS — R2689 Other abnormalities of gait and mobility: Secondary | ICD-10-CM

## 2021-03-14 LAB — HEPATIC FUNCTION PANEL
ALT: 14 U/L (ref 0–35)
AST: 15 U/L (ref 0–37)
Albumin: 4.6 g/dL (ref 3.5–5.2)
Alkaline Phosphatase: 79 U/L (ref 39–117)
Bilirubin, Direct: 0.1 mg/dL (ref 0.0–0.3)
Total Bilirubin: 0.4 mg/dL (ref 0.2–1.2)
Total Protein: 7.3 g/dL (ref 6.0–8.3)

## 2021-03-14 LAB — LIPID PANEL
Cholesterol: 262 mg/dL — ABNORMAL HIGH (ref 0–200)
HDL: 95.7 mg/dL (ref >39.00)
LDL Cholesterol: 152 mg/dL — ABNORMAL HIGH (ref 0–99)
NonHDL: 166.27
Total CHOL/HDL Ratio: 3
Triglycerides: 73 mg/dL (ref 0.0–149.0)
VLDL: 14.6 mg/dL (ref 0.0–40.0)

## 2021-03-14 MED ORDER — AMLODIPINE BESYLATE 10 MG PO TABS: 10.0000 mg | ORAL_TABLET | Freq: Every day | ORAL | 0 refills | Status: DC

## 2021-03-14 NOTE — Assessment & Plan Note (Signed)
Refer for colo, mammogram. She will send a copy of her covid card for booster information via mychart. Plan shingrix at age 50. Pap up to date.

## 2021-03-14 NOTE — Therapy (Signed)
[facility] [address] [place], [place], [address] Phone: [phone]   Fax:  [phone]  Physical Therapy Treatment  Patient Details  Name: LAYLAH RIGA MRN: [id number] Date of Birth: 29-Sep-1970 Referring Provider (PT): Referred by Lauraine Rinne. Followed by Leeroy Cha, MD. Surgeon Katha Hamming, MD)   Encounter Date: 03/14/2021   PT End of Session - 03/14/21 0001     Visit Number 2    Number of Visits 11    Date for PT Re-Evaluation 04/22/21    Authorization Type [employer] (VL: 20 HARD Max)    PT Start Time 1231    PT Stop Time 2297    PT Time Calculation (min) 44 min    Activity Tolerance Patient tolerated treatment well    Behavior During Therapy WFL for tasks assessed/performed             Past Medical History:  Diagnosis Date   DUB (dysfunctional uterine bleeding)    Hx   Hypertension    MHA (microangiopathic hemolytic anemia) ([facility])    Hx   Night sweats    Hx   Psoriasis of scalp     Past Surgical History:  Procedure Laterality Date   ACHILLES TENDON REPAIR Right 2010   BREAST BIOPSY     BREAST EXCISIONAL BIOPSY     CERVICAL POLYPECTOMY N/A 05/19/2014   Procedure: CERVICAL POLYPECTOMY;  Surgeon: Sharene Butters, MD;  Location: [facility];  Service: Gynecology;  Laterality: N/A;   EXTERNAL FIXATION LEG Left 01/03/2021   Procedure: EXTERNAL FIXATION ANKLE LEFT and right distal radius fracture ORIF;  Surgeon: Shona Needles, MD;  Location: [facility];  Service: Orthopedics;  Laterality: Left;   EXTERNAL FIXATION REMOVAL Left 01/08/2021   Procedure: REMOVAL EXTERNAL FIXATION LEG;  Surgeon: Shona Needles, MD;  Location: [facility];  Service: Orthopedics;  Laterality: Left;   HARDWARE REMOVAL Right 03/02/2021   Procedure: HARDWARE REMOVAL RIGHT ARM AND MANIPULATION OF LEFT ANKLE;  Surgeon: Shona Needles, MD;  Location: [facility];  Service: Orthopedics;  Laterality: Right;   HYSTEROSCOPY N/A 05/19/2014    Procedure: HYSTEROSCOPY;  Surgeon: Sharene Butters, MD;  Location: [facility];  Service: Gynecology;  Laterality: N/A;   OPEN REDUCTION INTERNAL FIXATION (ORIF) TIBIA/FIBULA FRACTURE Left 01/08/2021   Procedure: OPEN REDUCTION INTERNAL FIXATION (ORIF) PILON FRACTURE;  Surgeon: Shona Needles, MD;  Location: [facility];  Service: Orthopedics;  Laterality: Left;   WISDOM TOOTH EXTRACTION      There were no vitals filed for this visit.   Subjective Assessment - 03/14/21 1234     Subjective Patient is cleared for WBAT with CAM Boot. Patient reports she is also WBAT with RUE with wrist splint donned. Paitent reports no pain. No falls. Manipulation on ankle was 6/10. Patient reports has started to try to ambulate without CAM Boot.    Patient is accompained by: Family member    Pertinent History HTN    Limitations Standing;House hold activities;Walking    How long can you walk comfortably? 100 ft    Currently in Pain? No/denies    Pain Onset More than a month ago                [facility] PT Assessment - 03/14/21 1237       Restrictions   Weight Bearing Restrictions Yes    RUE Weight Bearing Weight bearing as tolerated   with wrist brace donned   LLE Weight Bearing Weight bearing as  tolerated    Other Position/Activity Restrictions use CAM boot, and slow transition out of boot as tolerated (Per MD Note)      ROM / Strength   AROM / PROM / Strength AROM;Strength      AROM   Overall AROM  Deficits    Overall AROM Comments manipulation to L Ankle on 6/10    AROM Assessment Site Ankle    Right/Left Ankle Left    Left Ankle Dorsiflexion -5   pain at end range   Left Ankle Plantar Flexion 14   pain at end range     Strength   Overall Strength Deficits    Strength Assessment Site Ankle    Right/Left Ankle Left    Left Ankle Dorsiflexion 3-/5    Left Ankle Plantar Flexion 3-/5               [facility] Adult PT Treatment/Exercise - 03/14/21 1237       Transfers   Transfers Sit to  Stand;Stand to Sit;Stand Pivot Transfers    Sit to Stand 6: Modified independent (Device/Increase time)    Stand to Sit 6: Modified independent (Device/Increase time)    Stand Pivot Transfers 6: Modified independent (Device/Increase time)    Comments Use of RW with sit <> stand, no platform used. Wrist Splint Donned per MD prescription. transfer from w/c <> mat with supervision.      Ambulation/Gait   Ambulation/Gait Yes    Ambulation/Gait Assistance 4: Min guard;5: Supervision    Ambulation/Gait Assistance Details Completed ambulation with RW. R Cam Boot and R Wrist Splint donned. Patient cleared to WBAT through RUE with wrist splint therefore did not utilize platform. With standing patient putting approx 25% weight on LLE. PT providing cues for improved weight shift to LLE. Paitent very heistant to take step with RLE, continue to demo hop to pattern. Completed ambulation x 10 ft with CGA from PT.    Ambulation Distance (Feet) 10 Feet    Assistive device Rolling walker    Gait Pattern Decreased arm swing - right;Decreased arm swing - left;Decreased step length - left    Ambulation Surface Level;Indoor      Neuro Re-ed    Neuro Re-ed Details  standing in // bars completed standing balance working on improved and equal weight shift between BLE to promote improved weight acceptance onto LLE, completed x 5 reps. attempted to complete static standing with equal weight shift wihtout UE support but unable, continue to require at least single UE support. With UE support, completed steps forward with RLE to promote weight shift/stance onto LLE x 8 reps, increased challenge with weight shift/acceptance and continued want to hop forward vs. stepping forward. seated rest break required after completion.      Exercises   Exercises Other Exercises    Other Exercises  Initiated HEP focused on ROM deficits in LLE. See [place] program for details.            Completed the following exercises during  session and established initial HEP for ankle ROM.  Access Code: [email or web address] URL: [email or web address] Date: 03/14/2021 Prepared by: Baldomero Lamy  Exercises Supine Ankle Pumps - 2 x daily - 7 x weekly - 1 sets - 10 reps Seated Ankle Alphabet - 2 x daily - 7 x weekly - 1 sets - 10 reps Long Sitting Calf Stretch with Strap - 2 x daily - 7 x weekly - 1 sets - 3 reps - 30 seconds hold  PT Education - 03/14/21 1409     Education Details Initial HEP, Bring tennis shoe into next session; Ambulation at home with RW (remove platforms and ambulate with wrist brace donned per MD restrictions)    Person(s) Educated Patient;Spouse    Methods Explanation    Comprehension Verbalized understanding              PT Short Term Goals - 03/14/21 1429       PT SHORT TERM GOAL #1   Title Patient will be independent with intial strengtheing/ROM/balance HEP (all STGs Due: 02/26/21)    Baseline initiated HEP on 6/22    Time 5    Period Weeks    Status Achieved    Target Date 02/26/21      PT SHORT TERM GOAL #2   Title Patient will undergo further strength/ROM testing upon MD clearance and LTG to be set as applicable    Baseline see flow sheet; assessed on 6/22    Time 5    Period Weeks    Status Achieved               PT Long Term Goals - 03/14/21 1435       PT LONG TERM GOAL #1   Title Patient will be independent with Final HEP for ROM/Strengthening/Balance (All LTGs Due; 04/02/21)    Baseline no HEP established    Time 10    Period Weeks    Status New      PT LONG TERM GOAL #2   Title Patient will improve gait speed to >/= 1.5 ft/sec with LRAD to demonstrate imrpoved household/community mobility    Baseline 0.78 ft/sec    Time 10    Period Weeks    Status New      PT LONG TERM GOAL #3   Title Patient will improve DF to >/= neutral (0 deg) and PF to >/= 25 degs    Baseline DF -5 deg, PF 14 deg    Time 10    Period Weeks    Status Revised      PT LONG  TERM GOAL #4   Title Patient will improve 5x sit <> stand to <12 seconds to demonstrate improved balance and functional mobility    Baseline 16.25 secs    Time 10    Period Weeks    Status New      PT LONG TERM GOAL #5   Title Patient will be able to ambulate >/= 500 ft w/ LRAD vs. No AD with step through pattern to demonstrate improved activity tolerance    Baseline 70 ft use of PFRW    Time 10    Period Weeks    Status New                   Plan - 03/14/21 1430     Clinical Impression Statement Patient has been progressed to WBAT for LLE in [employer], as well as per MD allowed to slowly progress out of CAM boot as tolerated. RUE WBAT with wrist brace donned. Assessed L ankle ROM/strength with deficits noted. Inintiated gait training with RW without platform due to clearance for WBAT, patient continue to demo hop to pattern and increased challenge with weight shift onto LLE. Rest of session spent establishing initial HEP for L ankle strength/ROM. Will continue to progress toward all LTGs.    Personal Factors and Comorbidities Comorbidity 1    Comorbidities HTN    Examination-Activity Limitations Bathing;Stairs;Stand;Transfers;Locomotion Level;Dressing;Squat;Hygiene/Grooming  Examination-Participation Restrictions Driving;Community Activity;Occupation    Stability/Clinical Decision Making Stable/Uncomplicated    Rehab Potential Good    PT Frequency 1x / week    PT Duration Other (comment)   10 Weeks   PT Treatment/Interventions ADLs/Self Care Home Management;Cryotherapy;Electrical Stimulation;Moist Heat;DME Instruction;Gait training;Stair training;Functional mobility training;Therapeutic activities;Therapeutic exercise;Balance training;Neuromuscular re-education;Cognitive remediation;Patient/family education;Manual techniques;Passive range of motion;Dry needling;Taping;Joint Manipulations    PT Next Visit Plan How was Exercises. Manual Therapy as tolerated to L Ankle. Continued  strengthening/weight shift activities. ROM exercises for ankle. Gastroc stretch. Gait training with RW trial without CAM boot and lace up brace.    Consulted and Agree with Plan of Care Patient;Family member/caregiver             Patient will benefit from skilled therapeutic intervention in order to improve the following deficits and impairments:  Abnormal gait, Decreased activity tolerance, Decreased endurance, Decreased knowledge of use of DME, Decreased range of motion, Decreased strength, Impaired sensation, Pain, Difficulty walking, Decreased balance  Visit Diagnosis: Stiffness of left ankle, not elsewhere classified  Other abnormalities of gait and mobility  Difficulty in walking, not elsewhere classified  Muscle weakness (generalized)     Problem List Patient Active Problem List   Diagnosis Date Noted   Vitamin D deficiency 03/14/2021   Hyperlipidemia 03/14/2021   Closed fracture of right wrist 02/27/2021   Thrombocytosis    Closed fracture of left distal tibia 01/11/2021   Tibia/fibula fracture 01/03/2021   Closed left pilon fracture 01/03/2021   Closed fracture of right distal radius 01/03/2021   Closed disp fracture of base of second metacarpal bone of right hand 01/03/2021   Anemia 02/13/2015   Essential hypertension 02/13/2015   Psoriasis of scalp 06/22/2012   Preventative health care 06/20/2011   ANOSMIA 03/09/2010   Dysfunctional uterine bleeding 03/15/2008   CONTACT DERMATITIS&OTH ECZEMA DUE OTH [facility] AGENT 03/15/2008    Jones Bales,  PT, DPT 03/14/2021, 2:37 PM  [facility] [address] [place], [place], [address] Phone: [phone]   Fax:  [phone]  Name: KIMERLY ROWAND MRN: [id number] Date of Birth: Sep 24, 1970
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
## 2021-03-14 NOTE — Telephone Encounter (Signed)
Pharmacy sent over a fax to have Eliquis 2.5 mg refilled.  But it is not current on the medication list.

## 2021-03-14 NOTE — Patient Instructions (Signed)
Ulnar Deviation (Passive)    With palm and wrist on table, place other hand on top to steady while bringing elbow outward. Hold __5-10__ seconds. Repeat _10___ times. Do hourly while awake every day.   Radial Deviation (Passive)    With wrist and palm on table, place other hand on top to keep steady while bringing elbow inward. Hold _5-10__ seconds. Repeat __10__ times. Do hourly while awake.       Sitting with elbows on table and palms together, slowly lower wrists toward table until stretch is felt. Be sure to keep palms together throughout stretch. Hold __10_ seconds. Relax. Repeat __10__ times. Do hourly while awake.   [email or web address]://[email or web address].[email or web address]   Flexion (Passive)    With palm on table near edge, hold steady with other hand on top. Lower elbow. Hold _10___ seconds. Repeat __10__ times. Do hourly while awake.  Extension (Passive)    Keep palm on table, using other hand on top to assist. Raise elbow. Hold __10__ seconds. Repeat __10__ times. Do hourly while awake. Activity: Resting hand with palm on hip, move elbow out to side.*  MP / PIP / DIP Composite Flexion (Passive Stretch)    Use other hand to bend all of your fingers at all three joints. Hold __10__ seconds. Repeat __10__ times. Do hourly while awake.

## 2021-03-14 NOTE — Patient Instructions (Signed)
Access Code: [email or web address] URL: [email or web address] Date: 03/14/2021 Prepared by: Baldomero Lamy  Exercises Supine Ankle Pumps - 2 x daily - 7 x weekly - 1 sets - 10 reps Seated Ankle Alphabet - 2 x daily - 7 x weekly - 1 sets - 10 reps Long Sitting Calf Stretch with Strap - 2 x daily - 7 x weekly - 1 sets - 3 reps - 30 seconds hold

## 2021-03-14 NOTE — Patient Instructions (Signed)
Please complete lab work prior to leaving.   
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
## 2021-03-14 NOTE — Assessment & Plan Note (Signed)
On supplement per ortho. Plan to recheck level next visit.

## 2021-03-14 NOTE — Assessment & Plan Note (Addendum)
BP Readings from Last 3 Encounters:  03/14/21 (!) 150/84  03/02/21 (!) 157/91  01/29/21 135/87   Initial bp reading was elevated but repeat manual check was better. Continue amlodipine 10mg  once daily.

## 2021-03-21 ENCOUNTER — Other Ambulatory Visit: Payer: Self-pay

## 2021-03-21 ENCOUNTER — Ambulatory Visit: Payer: No Typology Code available for payment source | Admitting: Occupational Therapy

## 2021-03-21 ENCOUNTER — Encounter: Payer: Self-pay | Admitting: Physical Therapy

## 2021-03-21 ENCOUNTER — Ambulatory Visit: Payer: No Typology Code available for payment source | Admitting: Physical Therapy

## 2021-03-21 DIAGNOSIS — M6281 Muscle weakness (generalized): Secondary | ICD-10-CM

## 2021-03-21 DIAGNOSIS — M25672 Stiffness of left ankle, not elsewhere classified: Secondary | ICD-10-CM

## 2021-03-21 DIAGNOSIS — M25641 Stiffness of right hand, not elsewhere classified: Secondary | ICD-10-CM

## 2021-03-21 DIAGNOSIS — R262 Difficulty in walking, not elsewhere classified: Secondary | ICD-10-CM

## 2021-03-21 DIAGNOSIS — M25531 Pain in right wrist: Secondary | ICD-10-CM

## 2021-03-21 DIAGNOSIS — R2689 Other abnormalities of gait and mobility: Secondary | ICD-10-CM

## 2021-03-21 DIAGNOSIS — R278 Other lack of coordination: Secondary | ICD-10-CM

## 2021-03-21 DIAGNOSIS — M25631 Stiffness of right wrist, not elsewhere classified: Secondary | ICD-10-CM
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
## 2021-03-21 NOTE — Patient Instructions (Signed)
  1. Grip Strengthening (Resistive Putty)   Squeeze putty using thumb and all fingers. Repeat _20___ times. Do __3__ sessions per day.   2. Roll putty into tube on table and pinch between each finger and thumb x 10 reps each. (can do ring and small finger together). Do 3 sessions per day  3. Lateral Pinch Strengthening (Resistive Putty)    Squeeze between thumb and side of index finger in turn. Repeat _10___ times. Do __3__ sessions per day.

## 2021-03-22 NOTE — Therapy (Signed)
[facility] [address] [place], [place], [address] Phone: [phone]   Fax:  [phone]  Physical Therapy Treatment  Patient Details  Name: Joanna Wright MRN: [id number] Date of Birth: 1970/12/01 Referring Provider (PT): Referred by Lauraine Rinne. Followed by Leeroy Cha, MD. Surgeon Katha Hamming, MD)   Encounter Date: 03/21/2021   PT End of Session - 03/21/21 1237     Visit Number 3    Number of Visits 11    Date for PT Re-Evaluation 04/22/21    Authorization Type [employer] (VL: 59 HARD Max)    PT Start Time 1233    PT Stop Time 3664    PT Time Calculation (min) 42 min    Equipment Utilized During Treatment Gait belt    Activity Tolerance Patient tolerated treatment well    Behavior During Therapy WFL for tasks assessed/performed             Past Medical History:  Diagnosis Date   DUB (dysfunctional uterine bleeding)    Hx   Hypertension    MHA (microangiopathic hemolytic anemia) (HCC)    Hx   Night sweats    Hx   Psoriasis of scalp     Past Surgical History:  Procedure Laterality Date   ACHILLES TENDON REPAIR Right 2010   BREAST BIOPSY     BREAST EXCISIONAL BIOPSY     CERVICAL POLYPECTOMY N/A 05/19/2014   Procedure: CERVICAL POLYPECTOMY;  Surgeon: Sharene Butters, MD;  Location: [facility];  Service: Gynecology;  Laterality: N/A;   EXTERNAL FIXATION LEG Left 01/03/2021   Procedure: EXTERNAL FIXATION ANKLE LEFT and right distal radius fracture ORIF;  Surgeon: Shona Needles, MD;  Location: [facility];  Service: Orthopedics;  Laterality: Left;   EXTERNAL FIXATION REMOVAL Left 01/08/2021   Procedure: REMOVAL EXTERNAL FIXATION LEG;  Surgeon: Shona Needles, MD;  Location: [facility];  Service: Orthopedics;  Laterality: Left;   HARDWARE REMOVAL Right 03/02/2021   Procedure: HARDWARE REMOVAL RIGHT ARM AND MANIPULATION OF LEFT ANKLE;  Surgeon: Shona Needles, MD;  Location: [facility];  Service: Orthopedics;   Laterality: Right;   HYSTEROSCOPY N/A 05/19/2014   Procedure: HYSTEROSCOPY;  Surgeon: Sharene Butters, MD;  Location: [facility];  Service: Gynecology;  Laterality: N/A;   OPEN REDUCTION INTERNAL FIXATION (ORIF) TIBIA/FIBULA FRACTURE Left 01/08/2021   Procedure: OPEN REDUCTION INTERNAL FIXATION (ORIF) PILON FRACTURE;  Surgeon: Shona Needles, MD;  Location: [facility];  Service: Orthopedics;  Laterality: Left;   WISDOM TOOTH EXTRACTION      There were no vitals filed for this visit.   Subjective Assessment - 03/21/21 1236     Subjective No new complaitns. No falls. Reports she has been walking at home some with no shoes or boot. Brought her shoe today. Having some sensitivity/pain sesation on hand/wrist from where the hair is growing back and brace rubs on it.    Pertinent History HTN    Limitations Standing;House hold activities;Walking    How long can you walk comfortably? 100 ft    Currently in Pain? No/denies    Pain Score 0-No pain                  [facility] Adult PT Treatment/Exercise - 03/21/21 1238       Transfers   Transfers Sit to Stand;Stand to Sit;Stand Pivot Transfers    Sit to Stand 6: Modified independent (Device/Increase time)    Stand to Sit 6: Modified independent (Device/Increase time)  Ambulation/Gait   Ambulation/Gait Yes    Ambulation/Gait Assistance 4: Min guard;4: Min assist    Ambulation/Gait Assistance Details assist to shift weight over left LE in stance at pelvis with cues for increased right step lenght. less "hopping" noted this way.    Ambulation Distance (Feet) 127 Feet   x1   Assistive device Rolling walker    Gait Pattern Decreased arm swing - right;Decreased arm swing - left;Decreased step length - left    Ambulation Surface Level;Indoor      Exercises   Exercises Other Exercises    Other Exercises  seated at edge of mat: left foot on large wooden wedge with toes at wide end- overpressure at knee and then across ankle joint to push left heel  down for stretch for 30 sec's x 4 reps; with right foot on foam bubble next to left foot on the floor- sit<>stands with UE assist for 10 reps with cues/faciliation for weight shifting onto left LE.      Manual Therapy   Manual Therapy Passive ROM;Joint mobilization    Manual therapy comments no reports of pain or discomfort with any manual therapy performed.    Joint Mobilization grade 1 talocrual joint mobs for increased ROM    Passive ROM left ankle for DF, inversion and eversion 30 sec's holds for 3 reps each.                      PT Short Term Goals - 03/14/21 1429       PT SHORT TERM GOAL #1   Title Patient will be independent with intial strengtheing/ROM/balance HEP (all STGs Due: 02/26/21)    Baseline initiated HEP on 6/22    Time 5    Period Weeks    Status Achieved    Target Date 02/26/21      PT SHORT TERM GOAL #2   Title Patient will undergo further strength/ROM testing upon MD clearance and LTG to be set as applicable    Baseline see flow sheet; assessed on 6/22    Time 5    Period Weeks    Status Achieved               PT Long Term Goals - 03/14/21 1435       PT LONG TERM GOAL #1   Title Patient will be independent with Final HEP for ROM/Strengthening/Balance (All LTGs Due; 04/02/21)    Baseline no HEP established    Time 10    Period Weeks    Status New      PT LONG TERM GOAL #2   Title Patient will improve gait speed to >/= 1.5 ft/sec with LRAD to demonstrate imrpoved household/community mobility    Baseline 0.78 ft/sec    Time 10    Period Weeks    Status New      PT LONG TERM GOAL #3   Title Patient will improve DF to >/= neutral (0 deg) and PF to >/= 25 degs    Baseline DF -5 deg, PF 14 deg    Time 10    Period Weeks    Status Revised      PT LONG TERM GOAL #4   Title Patient will improve 5x sit <> stand to <12 seconds to demonstrate improved balance and functional mobility    Baseline 16.25 secs    Time 10    Period Weeks     Status New      PT LONG  TERM GOAL #5   Title Patient will be able to ambulate >/= 500 ft w/ LRAD vs. No AD with step through pattern to demonstrate improved activity tolerance    Baseline 70 ft use of PFRW    Time 10    Period Weeks    Status New                   Plan - 03/21/21 1238     Clinical Impression Statement Today's skilled session focused on left ankle range of motion, left LE strengthening with emphasis on weight bearing on this limb and gait with sneaker on (vs CAM boot).  Continued to emphasize increased stance/weight bearing on left LE with gait with increased right step length. Pt with improved technique and less "hop to" pattern after cues/faciliation in session. No issues noted or reported in session. The pt is progressing toward goals and should benefit from continued PT to progress toward unmet goals.    Personal Factors and Comorbidities Comorbidity 1    Comorbidities HTN    Examination-Activity Limitations Bathing;Stairs;Stand;Transfers;Locomotion Level;Dressing;Squat;Hygiene/Grooming    Examination-Participation Restrictions Driving;Community Activity;Occupation    Stability/Clinical Decision Making Stable/Uncomplicated    Rehab Potential Good    PT Frequency 1x / week    PT Duration Other (comment)   10 Weeks   PT Treatment/Interventions ADLs/Self Care Home Management;Cryotherapy;Electrical Stimulation;Moist Heat;DME Instruction;Gait training;Stair training;Functional mobility training;Therapeutic activities;Therapeutic exercise;Balance training;Neuromuscular re-education;Cognitive remediation;Patient/family education;Manual techniques;Passive range of motion;Dry needling;Taping;Joint Manipulations    PT Next Visit Plan How was Exercises. Manual Therapy as tolerated to L Ankle. Continued strengthening/weight shift activities. ROM exercises for ankle. Gastroc stretch. Gait training with RW with shoe on. Progress pt away from wheelchair to walking more as able.     Consulted and Agree with Plan of Care Patient;Family member/caregiver             Patient will benefit from skilled therapeutic intervention in order to improve the following deficits and impairments:  Abnormal gait, Decreased activity tolerance, Decreased endurance, Decreased knowledge of use of DME, Decreased range of motion, Decreased strength, Impaired sensation, Pain, Difficulty walking, Decreased balance  Visit Diagnosis: Muscle weakness (generalized)  Stiffness of left ankle, not elsewhere classified  Other abnormalities of gait and mobility  Difficulty in walking, not elsewhere classified     Problem List Patient Active Problem List   Diagnosis Date Noted   Vitamin D deficiency 03/14/2021   Hyperlipidemia 03/14/2021   Closed fracture of right wrist 02/27/2021   Thrombocytosis    Closed fracture of left distal tibia 01/11/2021   Tibia/fibula fracture 01/03/2021   Closed left pilon fracture 01/03/2021   Closed fracture of right distal radius 01/03/2021   Closed disp fracture of base of second metacarpal bone of right hand 01/03/2021   Anemia 02/13/2015   Essential hypertension 02/13/2015   Psoriasis of scalp 06/22/2012   Preventative health care 06/20/2011   ANOSMIA 03/09/2010   Dysfunctional uterine bleeding 03/15/2008   CONTACT DERMATITIS&OTH ECZEMA DUE OTH [facility] AGENT 03/15/2008    Willow Ora, PTA, [facility] [address] [place], [place] [address] [phone] 03/22/21, 3:52 PM   Name: Joanna Wright MRN: [id number] Date of Birth: [birth year]

## 2021-03-28 ENCOUNTER — Ambulatory Visit: Payer: PRIVATE HEALTH INSURANCE | Attending: Physician Assistant | Admitting: Physical Therapy

## 2021-03-28 ENCOUNTER — Other Ambulatory Visit: Payer: Self-pay

## 2021-03-28 ENCOUNTER — Ambulatory Visit: Payer: PRIVATE HEALTH INSURANCE | Admitting: Occupational Therapy

## 2021-03-28 ENCOUNTER — Encounter: Payer: Self-pay | Admitting: Physical Therapy

## 2021-03-28 DIAGNOSIS — R2689 Other abnormalities of gait and mobility: Secondary | ICD-10-CM | POA: Insufficient documentation

## 2021-03-28 DIAGNOSIS — M6281 Muscle weakness (generalized): Secondary | ICD-10-CM | POA: Insufficient documentation

## 2021-03-28 DIAGNOSIS — R262 Difficulty in walking, not elsewhere classified: Secondary | ICD-10-CM | POA: Diagnosis present

## 2021-03-28 DIAGNOSIS — M25641 Stiffness of right hand, not elsewhere classified: Secondary | ICD-10-CM | POA: Diagnosis present

## 2021-03-28 DIAGNOSIS — M25672 Stiffness of left ankle, not elsewhere classified: Secondary | ICD-10-CM | POA: Insufficient documentation

## 2021-03-28 DIAGNOSIS — M25631 Stiffness of right wrist, not elsewhere classified: Secondary | ICD-10-CM

## 2021-03-28 DIAGNOSIS — M25531 Pain in right wrist: Secondary | ICD-10-CM

## 2021-03-28 DIAGNOSIS — R278 Other lack of coordination: Secondary | ICD-10-CM | POA: Insufficient documentation

## 2021-03-28 DIAGNOSIS — M79641 Pain in right hand: Secondary | ICD-10-CM | POA: Diagnosis present
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
## 2021-03-29 NOTE — Therapy (Signed)
[facility] [address] [place], [place], [address] Phone: [phone]   Fax:  [phone]  Physical Therapy Treatment  Patient Details  Name: Joanna Wright MRN: [id number] Date of Birth: [birth year] Referring Provider (PT): Referred by Lauraine Rinne. Followed by Leeroy Cha, MD. Surgeon Katha Hamming, MD)   Encounter Date: 03/28/2021   03/28/21 1239  PT Visits / Re-Eval  Visit Number 4  Number of Visits 11  Date for PT Re-Evaluation 04/22/21  Authorization  Authorization Type [employer] (VL: 20 HARD Max)  PT Time Calculation  PT Start Time 1232  PT Stop Time 1315  PT Time Calculation (min) 43 min  PT - End of Session  Equipment Utilized During Treatment Gait belt  Activity Tolerance Patient tolerated treatment well  Behavior During Therapy WFL for tasks assessed/performed     Past Medical History:  Diagnosis Date   DUB (dysfunctional uterine bleeding)    Hx   Hypertension    MHA (microangiopathic hemolytic anemia) (HCC)    Hx   Night sweats    Hx   Psoriasis of scalp     Past Surgical History:  Procedure Laterality Date   ACHILLES TENDON REPAIR Right 2010   BREAST BIOPSY     BREAST EXCISIONAL BIOPSY     CERVICAL POLYPECTOMY N/A 05/19/2014   Procedure: CERVICAL POLYPECTOMY;  Surgeon: Sharene Butters, MD;  Location: [facility];  Service: Gynecology;  Laterality: N/A;   EXTERNAL FIXATION LEG Left 01/03/2021   Procedure: EXTERNAL FIXATION ANKLE LEFT and right distal radius fracture ORIF;  Surgeon: Shona Needles, MD;  Location: [facility];  Service: Orthopedics;  Laterality: Left;   EXTERNAL FIXATION REMOVAL Left 01/08/2021   Procedure: REMOVAL EXTERNAL FIXATION LEG;  Surgeon: Shona Needles, MD;  Location: [facility];  Service: Orthopedics;  Laterality: Left;   HARDWARE REMOVAL Right 03/02/2021   Procedure: HARDWARE REMOVAL RIGHT ARM AND MANIPULATION OF LEFT ANKLE;  Surgeon: Shona Needles, MD;  Location: [facility];   Service: Orthopedics;  Laterality: Right;   HYSTEROSCOPY N/A 05/19/2014   Procedure: HYSTEROSCOPY;  Surgeon: Sharene Butters, MD;  Location: [facility];  Service: Gynecology;  Laterality: N/A;   OPEN REDUCTION INTERNAL FIXATION (ORIF) TIBIA/FIBULA FRACTURE Left 01/08/2021   Procedure: OPEN REDUCTION INTERNAL FIXATION (ORIF) PILON FRACTURE;  Surgeon: Shona Needles, MD;  Location: [facility];  Service: Orthopedics;  Laterality: Left;   WISDOM TOOTH EXTRACTION      There were no vitals filed for this visit.    03/28/21 1237  Symptoms/Limitations  Subjective No new complaints. No falls. Walking with RW/CAM boot on arrival to therapy, no wheelchair! Denies any pain, just swelling in hand/wrist and left foot  Patient is accompained by: Family member  Pertinent History HTN  Limitations Standing;House hold activities;Walking  How long can you walk comfortably? 100 ft  Pain Assessment  Currently in Pain? No/denies  Pain Score 0       03/28/21 1240  Transfers  Transfers Sit to Stand;Stand to Sit;Stand Pivot Transfers  Sit to Stand 6: Modified independent (Device/Increase time)  Stand to Sit 6: Modified independent (Device/Increase time)  Ambulation/Gait  Ambulation/Gait Yes  Ambulation/Gait Assistance 5: Supervision;4: Min guard  Ambulation/Gait Assistance Details changed over to shoe (vs CAM boot) after manual therapy. Max cues for increased left weight bearing/stance time with increased right step length with gait.  Ambulation Distance (Feet) 115 Feet (total distance with stops for ex's around gym)  Assistive device Rolling walker  Gait  Pattern Decreased arm swing - right;Decreased arm swing - left;Decreased step length - left  Ambulation Surface Level;Indoor  Exercises  Exercises Other Exercises  Other Exercises  seated at edge of mat: left foot on large wooden wedge with toes at wide end- overpressure at knee and then across ankle joint to push left heel down for stretch for 30 sec's x 4  reps  Knee/Hip Exercises: Aerobic  Other Aerobic Scifit LE's only on level 2.5 x 8 minutes with goal >/= 90 steps per minute for strengthening, heel cord stretching (pt cued to keep heel down on peddle) and to promote weight through left LE.  Knee/Hip Exercises: Machines for Strengthening  Cybex Leg Press bil LE's- 100# 2 sets of 10 reps, then left LE only 40#'s for 2 sets of 10 reps. cues for full range and slow, controlled movemetns.  Manual Therapy  Manual Therapy Passive ROM;Joint mobilization  Manual therapy comments no reports of pain or discomfort with any manual therapy performed.  Joint Mobilization grade 1 talocrual joint mobs for increased ROM  Passive ROM left ankle for DF, inversion and eversion 30 sec's holds for 3 reps each.          PT Short Term Goals - 03/14/21 1429       PT SHORT TERM GOAL #1   Title Patient will be independent with intial strengtheing/ROM/balance HEP (all STGs Due: 02/26/21)    Baseline initiated HEP on 6/22    Time 5    Period Weeks    Status Achieved    Target Date 02/26/21      PT SHORT TERM GOAL #2   Title Patient will undergo further strength/ROM testing upon MD clearance and LTG to be set as applicable    Baseline see flow sheet; assessed on 6/22    Time 5    Period Weeks    Status Achieved               PT Long Term Goals - 03/14/21 1435       PT LONG TERM GOAL #1   Title Patient will be independent with Final HEP for ROM/Strengthening/Balance (All LTGs Due; 04/02/21)    Baseline no HEP established    Time 10    Period Weeks    Status New      PT LONG TERM GOAL #2   Title Patient will improve gait speed to >/= 1.5 ft/sec with LRAD to demonstrate imrpoved household/community mobility    Baseline 0.78 ft/sec    Time 10    Period Weeks    Status New      PT LONG TERM GOAL #3   Title Patient will improve DF to >/= neutral (0 deg) and PF to >/= 25 degs    Baseline DF -5 deg, PF 14 deg    Time 10    Period Weeks     Status Revised      PT LONG TERM GOAL #4   Title Patient will improve 5x sit <> stand to <12 seconds to demonstrate improved balance and functional mobility    Baseline 16.25 secs    Time 10    Period Weeks    Status New      PT LONG TERM GOAL #5   Title Patient will be able to ambulate >/= 500 ft w/ LRAD vs. No AD with step through pattern to demonstrate improved activity tolerance    Baseline 70 ft use of PFRW    Time 10  Period Weeks    Status New               03/28/21 1239  Plan  Clinical Impression Statement Today's skilled session continued to focus on LE strengthening, ankle ROM and gait training with sneaker on left foot. Pt with improved step length/stride length at end of session after manual therapy and ex's to promote increased weight bearing. The pt is making progress and should benefit from continued PT to progress toward unmet goals.  Personal Factors and Comorbidities Comorbidity 1  Comorbidities HTN  Examination-Activity Limitations Bathing;Stairs;Stand;Transfers;Locomotion Level;Dressing;Squat;Hygiene/Grooming  Examination-Participation Restrictions Driving;Community Activity;Occupation  Pt will benefit from skilled therapeutic intervention in order to improve on the following deficits Abnormal gait;Decreased activity tolerance;Decreased endurance;Decreased knowledge of use of DME;Decreased range of motion;Decreased strength;Impaired sensation;Pain;Difficulty walking;Decreased balance  Stability/Clinical Decision Making Stable/Uncomplicated  Rehab Potential Good  PT Frequency 1x / week  PT Duration Other (comment) (10 Weeks)  PT Treatment/Interventions ADLs/Self Care Home Management;Cryotherapy;Electrical Stimulation;Moist Heat;DME Instruction;Gait training;Stair training;Functional mobility training;Therapeutic activities;Therapeutic exercise;Balance training;Neuromuscular re-education;Cognitive remediation;Patient/family education;Manual techniques;Passive  range of motion;Dry needling;Taping;Joint Manipulations  PT Next Visit Plan How was Exercises. Manual Therapy as tolerated to L Ankle. Continued strengthening/weight shift activities. ROM exercises for ankle. Gastroc stretch. Gait training with RW with shoe on. Progress pt away from wheelchair to walking more as able.  Consulted and Agree with Plan of Care Patient;Family member/caregiver         Patient will benefit from skilled therapeutic intervention in order to improve the following deficits and impairments:  Abnormal gait, Decreased activity tolerance, Decreased endurance, Decreased knowledge of use of DME, Decreased range of motion, Decreased strength, Impaired sensation, Pain, Difficulty walking, Decreased balance  Visit Diagnosis: Muscle weakness (generalized)  Stiffness of left ankle, not elsewhere classified  Other abnormalities of gait and mobility  Difficulty in walking, not elsewhere classified      Problem List Patient Active Problem List   Diagnosis Date Noted   Vitamin D deficiency 03/14/2021   Hyperlipidemia 03/14/2021   Closed fracture of right wrist 02/27/2021   Thrombocytosis    Closed fracture of left distal tibia 01/11/2021   Tibia/fibula fracture 01/03/2021   Closed left pilon fracture 01/03/2021   Closed fracture of right distal radius 01/03/2021   Closed disp fracture of base of second metacarpal bone of right hand 01/03/2021   Anemia 02/13/2015   Essential hypertension 02/13/2015   Psoriasis of scalp 06/22/2012   Preventative health care 06/20/2011   ANOSMIA 03/09/2010   Dysfunctional uterine bleeding 03/15/2008   CONTACT DERMATITIS&OTH ECZEMA DUE OTH [facility] AGENT 03/15/2008    Willow Ora, PTA, [facility] [address], [place], [place] [address] [phone] 04/04/21, 9:18 AM   Name: Joanna Wright MRN: [id number] Date of Birth: December 08, 1970

## 2021-04-04 ENCOUNTER — Ambulatory Visit: Payer: PRIVATE HEALTH INSURANCE | Admitting: Physical Therapy

## 2021-04-04 ENCOUNTER — Encounter: Payer: Self-pay | Admitting: Physical Therapy

## 2021-04-04 ENCOUNTER — Other Ambulatory Visit: Payer: Self-pay

## 2021-04-04 ENCOUNTER — Ambulatory Visit: Payer: PRIVATE HEALTH INSURANCE | Admitting: Occupational Therapy

## 2021-04-04 DIAGNOSIS — M6281 Muscle weakness (generalized): Secondary | ICD-10-CM

## 2021-04-04 DIAGNOSIS — M25672 Stiffness of left ankle, not elsewhere classified: Secondary | ICD-10-CM

## 2021-04-04 DIAGNOSIS — M25631 Stiffness of right wrist, not elsewhere classified: Secondary | ICD-10-CM

## 2021-04-04 DIAGNOSIS — R262 Difficulty in walking, not elsewhere classified: Secondary | ICD-10-CM

## 2021-04-04 DIAGNOSIS — M25641 Stiffness of right hand, not elsewhere classified: Secondary | ICD-10-CM

## 2021-04-04 DIAGNOSIS — R2689 Other abnormalities of gait and mobility: Secondary | ICD-10-CM
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
## 2021-04-05 NOTE — Therapy (Signed)
[facility] [address] [place], [place], [address] Phone: [phone]   Fax:  [phone]  Physical Therapy Treatment  Patient Details  Name: Joanna Wright MRN: [id number] Date of Birth: May 25, 1971 Referring Provider (PT): Referred by Lauraine Rinne. Followed by Leeroy Cha, MD. Surgeon Katha Hamming, MD)   Encounter Date: 04/04/2021   PT End of Session - 04/04/21 1239     Visit Number 5    Number of Visits 11    Date for PT Re-Evaluation 04/22/21    Authorization Type Med[employer] (VL: 61 HARD Max)    PT Start Time 1232    PT Stop Time 7482    PT Time Calculation (min) 43 min    Equipment Utilized During Treatment Gait belt    Activity Tolerance Patient tolerated treatment well    Behavior During Therapy WFL for tasks assessed/performed             Past Medical History:  Diagnosis Date   DUB (dysfunctional uterine bleeding)    Hx   Hypertension    MHA (microangiopathic hemolytic anemia) (HCC)    Hx   Night sweats    Hx   Psoriasis of scalp     Past Surgical History:  Procedure Laterality Date   ACHILLES TENDON REPAIR Right 2010   BREAST BIOPSY     BREAST EXCISIONAL BIOPSY     CERVICAL POLYPECTOMY N/A 05/19/2014   Procedure: CERVICAL POLYPECTOMY;  Surgeon: Sharene Butters, MD;  Location: [facility];  Service: Gynecology;  Laterality: N/A;   EXTERNAL FIXATION LEG Left 01/03/2021   Procedure: EXTERNAL FIXATION ANKLE LEFT and right distal radius fracture ORIF;  Surgeon: Shona Needles, MD;  Location: [facility];  Service: Orthopedics;  Laterality: Left;   EXTERNAL FIXATION REMOVAL Left 01/08/2021   Procedure: REMOVAL EXTERNAL FIXATION LEG;  Surgeon: Shona Needles, MD;  Location: [facility];  Service: Orthopedics;  Laterality: Left;   HARDWARE REMOVAL Right 03/02/2021   Procedure: HARDWARE REMOVAL RIGHT ARM AND MANIPULATION OF LEFT ANKLE;  Surgeon: Shona Needles, MD;  Location: [facility];  Service: Orthopedics;   Laterality: Right;   HYSTEROSCOPY N/A 05/19/2014   Procedure: HYSTEROSCOPY;  Surgeon: Sharene Butters, MD;  Location: [facility];  Service: Gynecology;  Laterality: N/A;   OPEN REDUCTION INTERNAL FIXATION (ORIF) TIBIA/FIBULA FRACTURE Left 01/08/2021   Procedure: OPEN REDUCTION INTERNAL FIXATION (ORIF) PILON FRACTURE;  Surgeon: Shona Needles, MD;  Location: [facility];  Service: Orthopedics;  Laterality: Left;   WISDOM TOOTH EXTRACTION      There were no vitals filed for this visit.   Subjective Assessment - 04/04/21 1237     Subjective No new complaints. No falls or pain to report, still some swelling present. Intermittently using ice to help with swelling. To clinic today with shoes on using walker.    Patient is accompained by: Family member   daughter   Pertinent History HTN    Limitations Standing;House hold activities;Walking    How long can you walk comfortably? 100 ft    Currently in Pain? No/denies    Pain Score 0-No pain                     [facility] Adult PT Treatment/Exercise - 04/04/21 1240       Transfers   Transfers Sit to Stand;Stand to [employer] Transfers    Sit to Stand 6: Modified independent (Device/Increase time)    Stand to Sit 6: Modified independent (  Device/Increase time)      Ambulation/Gait   Ambulation/Gait Yes    Ambulation/Gait Assistance 5: Supervision    Ambulation/Gait Assistance Details cues for increased right step length with increased left stance time with gait to progress away from step to pattern towards reciprocal stepping pattern with improvement noted, not fully reciprocal though    Ambulation Distance (Feet) --   around clinic between tasks   Assistive device Rolling walker    Gait Pattern Decreased arm swing - right;Decreased arm swing - left;Decreased step length - left    Ambulation Surface Level;Indoor      Exercises   Exercises Other Exercises    Other Exercises  seated at edge of mat: left foot on large wooden wedge with toes  at wide end- overpressure at knee and then across ankle joint to push left heel down for stretch for 30 sec's x 4 rep then with Fitter board for ant/post taps, lateral taps, circles both ways. assist needed to keep foot on center of board and to stabilize knee to decrease compensatory movemetns.      Knee/Hip Exercises: Aerobic   Other Aerobic Scifit LE's only on level 3.0 x 5 minutes with goal >/= 90 steps per minute for strengthening, heel cord stretching (pt cued to keep heel down on peddle) and to promote weight through left LE.      Knee/Hip Exercises: Machines for Strengthening   Cybex Leg Press bil LE's- 100# 2 sets of 10 reps, then left LE only 40#'s for 1 sets of 10 reps. then 45# for 10 more reps, cues for full range and slow, controlled movemetns.      Manual Therapy   Manual Therapy Passive ROM;Joint mobilization    Manual therapy comments no reports of pain or discomfort with any manual therapy performed.    Joint Mobilization grade 1 talocrual joint mobs for increased ROM    Passive ROM left ankle for DF, inversion and eversion 30 sec's holds for 3 reps each.                      PT Short Term Goals - 03/14/21 1429       PT SHORT TERM GOAL #1   Title Patient will be independent with intial strengtheing/ROM/balance HEP (all STGs Due: 02/26/21)    Baseline initiated HEP on 6/22    Time 5    Period Weeks    Status Achieved    Target Date 02/26/21      PT SHORT TERM GOAL #2   Title Patient will undergo further strength/ROM testing upon MD clearance and LTG to be set as applicable    Baseline see flow sheet; assessed on 6/22    Time 5    Period Weeks    Status Achieved               PT Long Term Goals - 03/14/21 1435       PT LONG TERM GOAL #1   Title Patient will be independent with Final HEP for ROM/Strengthening/Balance (All LTGs Due; 04/02/21)    Baseline no HEP established    Time 10    Period Weeks    Status New      PT LONG TERM GOAL #2    Title Patient will improve gait speed to >/= 1.5 ft/sec with LRAD to demonstrate imrpoved household/community mobility    Baseline 0.78 ft/sec    Time 10    Period Weeks    Status New  PT LONG TERM GOAL #3   Title Patient will improve DF to >/= neutral (0 deg) and PF to >/= 25 degs    Baseline DF -5 deg, PF 14 deg    Time 10    Period Weeks    Status Revised      PT LONG TERM GOAL #4   Title Patient will improve 5x sit <> stand to <12 seconds to demonstrate improved balance and functional mobility    Baseline 16.25 secs    Time 10    Period Weeks    Status New      PT LONG TERM GOAL #5   Title Patient will be able to ambulate >/= 500 ft w/ LRAD vs. No AD with step through pattern to demonstrate improved activity tolerance    Baseline 70 ft use of PFRW    Time 10    Period Weeks    Status New                   Plan - 04/04/21 1239     Clinical Impression Statement Today's skilled session continued to focus on ankle ROM, LE strengthening and gait training with RW with emphasis on increased stance time on left LE. The pt was able to improved gait mechanics is session after cues/skilled intervention. The pt is making progress toward goals and should benefit from continued PT to progress toward unmet goals.    Personal Factors and Comorbidities Comorbidity 1    Comorbidities HTN    Examination-Activity Limitations Bathing;Stairs;Stand;Transfers;Locomotion Level;Dressing;Squat;Hygiene/Grooming    Examination-Participation Restrictions Driving;Community Activity;Occupation    Stability/Clinical Decision Making Stable/Uncomplicated    Rehab Potential Good    PT Frequency 1x / week    PT Duration Other (comment)   10 Weeks   PT Treatment/Interventions ADLs/Self Care Home Management;Cryotherapy;Electrical Stimulation;Moist Heat;DME Instruction;Gait training;Stair training;Functional mobility training;Therapeutic activities;Therapeutic exercise;Balance training;Neuromuscular  re-education;Cognitive remediation;Patient/family education;Manual techniques;Passive range of motion;Dry needling;Taping;Joint Manipulations    PT Next Visit Plan How was Exercises. Manual Therapy as tolerated to L Ankle. Continued strengthening/weight shift activities. ROM exercises for ankle. Gastroc stretch. Gait training with RW with shoe on. Progress pt away from wheelchair to walking more as able.    Consulted and Agree with Plan of Care Patient;Family member/caregiver             Patient will benefit from skilled therapeutic intervention in order to improve the following deficits and impairments:  Abnormal gait, Decreased activity tolerance, Decreased endurance, Decreased knowledge of use of DME, Decreased range of motion, Decreased strength, Impaired sensation, Pain, Difficulty walking, Decreased balance  Visit Diagnosis: Muscle weakness (generalized)  Stiffness of left ankle, not elsewhere classified  Other abnormalities of gait and mobility  Difficulty in walking, not elsewhere classified     Problem List Patient Active Problem List   Diagnosis Date Noted   Vitamin D deficiency 03/14/2021   Hyperlipidemia 03/14/2021   Closed fracture of right wrist 02/27/2021   Thrombocytosis    Closed fracture of left distal tibia 01/11/2021   Tibia/fibula fracture 01/03/2021   Closed left pilon fracture 01/03/2021   Closed fracture of right distal radius 01/03/2021   Closed disp fracture of base of second metacarpal bone of right hand 01/03/2021   Anemia 02/13/2015   Essential hypertension 02/13/2015   Psoriasis of scalp 06/22/2012   Preventative health care 06/20/2011   ANOSMIA 03/09/2010   Dysfunctional uterine bleeding 03/15/2008   CONTACT DERMATITIS&OTH [place] DUE OTH [facility] AGENT 03/15/2008    Willow Ora, PTA,  [facility] [address], [place], [place] [address] [phone] 04/05/21, 11:49 AM   Name: Joanna Wright MRN:  [id number] Date of Birth: Feb 12, 1971

## 2021-04-10 ENCOUNTER — Ambulatory Visit (HOSPITAL_BASED_OUTPATIENT_CLINIC_OR_DEPARTMENT_OTHER): Payer: No Typology Code available for payment source

## 2021-04-11 ENCOUNTER — Ambulatory Visit: Payer: PRIVATE HEALTH INSURANCE | Admitting: Physical Therapy

## 2021-04-11 ENCOUNTER — Other Ambulatory Visit: Payer: Self-pay

## 2021-04-11 ENCOUNTER — Encounter: Payer: Self-pay | Admitting: Physical Therapy

## 2021-04-11 ENCOUNTER — Ambulatory Visit: Payer: PRIVATE HEALTH INSURANCE | Admitting: Occupational Therapy

## 2021-04-11 DIAGNOSIS — M25631 Stiffness of right wrist, not elsewhere classified: Secondary | ICD-10-CM

## 2021-04-11 DIAGNOSIS — R262 Difficulty in walking, not elsewhere classified: Secondary | ICD-10-CM

## 2021-04-11 DIAGNOSIS — M6281 Muscle weakness (generalized): Secondary | ICD-10-CM

## 2021-04-11 DIAGNOSIS — R2689 Other abnormalities of gait and mobility: Secondary | ICD-10-CM

## 2021-04-11 DIAGNOSIS — R278 Other lack of coordination: Secondary | ICD-10-CM

## 2021-04-11 DIAGNOSIS — M25641 Stiffness of right hand, not elsewhere classified: Secondary | ICD-10-CM

## 2021-04-11 DIAGNOSIS — M25531 Pain in right wrist: Secondary | ICD-10-CM

## 2021-04-11 DIAGNOSIS — M25672 Stiffness of left ankle, not elsewhere classified: Secondary | ICD-10-CM

## 2021-04-11 DIAGNOSIS — M79641 Pain in right hand: Secondary | ICD-10-CM

## 2021-04-11 NOTE — Patient Instructions (Signed)
  Wrist Extension: Resisted    With right palm down, __1__ pound weight in hand, bend wrist up. Return slowly. Repeat __10__ times per set.  Do __3__ sessions per day.   Wrist Radial Deviation: Resisted    With right thumb up, __1__ pound weight in hand, bend wrist up. Return slowly. Repeat _10___ times per set.  Do __3__ sessions per day.
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
## 2021-04-11 NOTE — Therapy (Signed)
[facility] [address] [place], [place], [address] Phone: [phone]   Fax:  [phone]  Physical Therapy Treatment  Patient Details  Name: Joanna Wright MRN: [id number] Date of Birth: [birth year] Referring Provider (PT): Referred by Lauraine Rinne. Followed by Leeroy Cha, MD. Surgeon Katha Hamming, MD)   Encounter Date: 04/11/2021   PT End of Session - 04/11/21 1320     Visit Number 6    Number of Visits 11    Date for PT Re-Evaluation 04/22/21    Authorization Type [employer]: insurance reset on 03/23/21 with 15 visits for OT, 15 vists for PT per pt report    Authorization - Visit Number 3    Authorization - Number of Visits 15    PT Start Time 6073    PT Stop Time 1400    PT Time Calculation (min) 43 min    Equipment Utilized During Treatment Gait belt    Activity Tolerance Patient tolerated treatment well    Behavior During Therapy WFL for tasks assessed/performed             Past Medical History:  Diagnosis Date   DUB (dysfunctional uterine bleeding)    Hx   Hypertension    MHA (microangiopathic hemolytic anemia) (HCC)    Hx   Night sweats    Hx   Psoriasis of scalp     Past Surgical History:  Procedure Laterality Date   ACHILLES TENDON REPAIR Right 2010   BREAST BIOPSY     BREAST EXCISIONAL BIOPSY     CERVICAL POLYPECTOMY N/A 05/19/2014   Procedure: CERVICAL POLYPECTOMY;  Surgeon: Sharene Butters, MD;  Location: [facility];  Service: Gynecology;  Laterality: N/A;   EXTERNAL FIXATION LEG Left 01/03/2021   Procedure: EXTERNAL FIXATION ANKLE LEFT and right distal radius fracture ORIF;  Surgeon: Shona Needles, MD;  Location: [facility];  Service: Orthopedics;  Laterality: Left;   EXTERNAL FIXATION REMOVAL Left 01/08/2021   Procedure: REMOVAL EXTERNAL FIXATION LEG;  Surgeon: Shona Needles, MD;  Location: [facility];  Service: Orthopedics;  Laterality: Left;   HARDWARE REMOVAL Right 03/02/2021    Procedure: HARDWARE REMOVAL RIGHT ARM AND MANIPULATION OF LEFT ANKLE;  Surgeon: Shona Needles, MD;  Location: [facility];  Service: Orthopedics;  Laterality: Right;   HYSTEROSCOPY N/A 05/19/2014   Procedure: HYSTEROSCOPY;  Surgeon: Sharene Butters, MD;  Location: [facility];  Service: Gynecology;  Laterality: N/A;   OPEN REDUCTION INTERNAL FIXATION (ORIF) TIBIA/FIBULA FRACTURE Left 01/08/2021   Procedure: OPEN REDUCTION INTERNAL FIXATION (ORIF) PILON FRACTURE;  Surgeon: Shona Needles, MD;  Location: [facility];  Service: Orthopedics;  Laterality: Left;   WISDOM TOOTH EXTRACTION      There were no vitals filed for this visit.   Subjective Assessment - 04/11/21 1318     Subjective No new complaints. No falls Some pain on top of foot/ankle. None in the wrist.    Patient is accompained by: Family member   spouse   Pertinent History HTN    Limitations Standing;House hold activities;Walking    How long can you walk comfortably? 100 ft    Currently in Pain? Yes    Pain Score 6     Pain Location Foot    Pain Orientation Left    Pain Descriptors / Indicators Aching    Pain Type Acute pain;Surgical pain    Pain Onset More than a month ago    Pain Frequency Intermittent    Aggravating  Factors  weight bearing on foot    Pain Relieving Factors ice, elevation, meds                      [facility] Adult PT Treatment/Exercise - 04/11/21 1321       Transfers   Transfers Sit to Stand;Stand to [employer] Transfers    Sit to Stand 6: Modified independent (Device/Increase time)    Stand to Sit 6: Modified independent (Device/Increase time)      Ambulation/Gait   Ambulation/Gait Yes    Ambulation/Gait Assistance 5: Supervision    Ambulation/Gait Assistance Details several short bouts of gait between OT/PT session and activities performed with cues needed for increased left stance time, increased right step length and heel>toe step progression on left foot. Pt with most carryover noted after work  on balance board in parallel bars.    Ambulation Distance (Feet) --   around clinic with session   Assistive device Rolling walker    Gait Pattern Decreased arm swing - right;Decreased arm swing - left;Decreased step length - left    Ambulation Surface Level;Indoor      Knee/Hip Exercises: Aerobic   Other Aerobic Scifit LE's only on level 3.5 x 8  minutes with goal >/= 90 steps per minute for strengthening, heel cord stretching (pt cued to keep heel down on peddle) and to promote weight through left LE.      Knee/Hip Exercises: Machines for Strengthening   Cybex Leg Press bil LE's- 100# 2 sets of 10 reps, then left LE only  45# f2 sets of 10  reps, cues for full range and slow, controlled movemetns.                 Balance Exercises - 04/11/21 1337       Balance Exercises: Standing   Rockerboard Anterior/posterior;Lateral;EO;EC;30 seconds;Other reps (comment);Limitations    Rockerboard Limitations performed both ways on board with no UE support: rocking the board with emphasis on movement at ankle with tall posture with EO, progressing to EC with min guard to min assist for balance; then holding the board steady for 30 sec's x 3 reps with EC, min guard to min assist.    Other Standing Exercises in paralllel bars with left stance on balance board in ant/post direction with bil UE support: attempted to step over with right foot to floor in front<>then back to floor in back. Pt able to step over, unable to step back. Transitioned to 6 inch step next to balance board- left stand wtih forward stepping to step<>backward stepping to floor                 PT Short Term Goals - 03/14/21 1429       PT SHORT TERM GOAL #1   Title Patient will be independent with intial strengtheing/ROM/balance HEP (all STGs Due: 02/26/21)    Baseline initiated HEP on 6/22    Time 5    Period Weeks    Status Achieved    Target Date 02/26/21      PT SHORT TERM GOAL #2   Title Patient will undergo  further strength/ROM testing upon MD clearance and LTG to be set as applicable    Baseline see flow sheet; assessed on 6/22    Time 5    Period Weeks    Status Achieved               PT Long Term Goals - 03/14/21 1435  PT LONG TERM GOAL #1   Title Patient will be independent with Final HEP for ROM/Strengthening/Balance (All LTGs Due; 04/02/21)    Baseline no HEP established    Time 10    Period Weeks    Status New      PT LONG TERM GOAL #2   Title Patient will improve gait speed to >/= 1.5 ft/sec with LRAD to demonstrate imrpoved household/community mobility    Baseline 0.78 ft/sec    Time 10    Period Weeks    Status New      PT LONG TERM GOAL #3   Title Patient will improve DF to >/= neutral (0 deg) and PF to >/= 25 degs    Baseline DF -5 deg, PF 14 deg    Time 10    Period Weeks    Status Revised      PT LONG TERM GOAL #4   Title Patient will improve 5x sit <> stand to <12 seconds to demonstrate improved balance and functional mobility    Baseline 16.25 secs    Time 10    Period Weeks    Status New      PT LONG TERM GOAL #5   Title Patient will be able to ambulate >/= 500 ft w/ LRAD vs. No AD with step through pattern to demonstrate improved activity tolerance    Baseline 70 ft use of PFRW    Time 10    Period Weeks    Status New                   Plan - 04/11/21 1320     Clinical Impression Statement Today's skilled session continued to focus on LE strengthening, ankle ROM and gait mechanics with RW with emphasis on increased left stance/right step lenght. Progressed pt to more standing ex's today with mild increase in anke/foot pain reported afterwards, 6-7/10. No other issues noted or reported in session. The pt is making progress toward goals and should benefit from continued PT to progress toward unmet goals.    Personal Factors and Comorbidities Comorbidity 1    Comorbidities HTN    Examination-Activity Limitations  Bathing;Stairs;Stand;Transfers;Locomotion Level;Dressing;Squat;Hygiene/Grooming    Examination-Participation Restrictions Driving;Community Activity;Occupation    Stability/Clinical Decision Making Stable/Uncomplicated    Rehab Potential Good    PT Frequency 1x / week    PT Duration Other (comment)   10 Weeks   PT Treatment/Interventions ADLs/Self Care Home Management;Cryotherapy;Electrical Stimulation;Moist Heat;DME Instruction;Gait training;Stair training;Functional mobility training;Therapeutic activities;Therapeutic exercise;Balance training;Neuromuscular re-education;Cognitive remediation;Patient/family education;Manual techniques;Passive range of motion;Dry needling;Taping;Joint Manipulations    PT Next Visit Plan How was Exercises. Manual Therapy as tolerated to L Ankle. Continued strengthening/weight shift activities. ROM exercises for ankle. Gastroc stretch. Gait training with RW with emphasis on equal stance and step lenght, ? use of treadmil/gait trainer program.    Consulted and Agree with Plan of Care Patient;Family member/caregiver             Patient will benefit from skilled therapeutic intervention in order to improve the following deficits and impairments:  Abnormal gait, Decreased activity tolerance, Decreased endurance, Decreased knowledge of use of DME, Decreased range of motion, Decreased strength, Impaired sensation, Pain, Difficulty walking, Decreased balance  Visit Diagnosis: Stiffness of left ankle, not elsewhere classified  Muscle weakness (generalized)  Other abnormalities of gait and mobility  Difficulty in walking, not elsewhere classified     Problem List Patient Active Problem List   Diagnosis Date Noted   Vitamin D deficiency  03/14/2021   Hyperlipidemia 03/14/2021   Closed fracture of right wrist 02/27/2021   Thrombocytosis    Closed fracture of left distal tibia 01/11/2021   Tibia/fibula fracture 01/03/2021   Closed left pilon fracture  01/03/2021   Closed fracture of right distal radius 01/03/2021   Closed disp fracture of base of second metacarpal bone of right hand 01/03/2021   Anemia 02/13/2015   Essential hypertension 02/13/2015   Psoriasis of scalp 06/22/2012   Preventative health care 06/20/2011   ANOSMIA 03/09/2010   Dysfunctional uterine bleeding 03/15/2008   CONTACT DERMATITIS&OTH ECZEMA DUE OTH [facility] AGENT 03/15/2008    Willow Ora, PTA, [facility] [address], [place], [place] [address] [phone] 04/11/21, 8:51 PM    Name: MERLA SAWKA MRN: [id number] Date of Birth: 1971/01/28

## 2021-04-18 ENCOUNTER — Ambulatory Visit: Payer: PRIVATE HEALTH INSURANCE | Admitting: Occupational Therapy

## 2021-04-18 ENCOUNTER — Ambulatory Visit: Payer: PRIVATE HEALTH INSURANCE

## 2021-04-18 ENCOUNTER — Other Ambulatory Visit: Payer: Self-pay

## 2021-04-18 DIAGNOSIS — M6281 Muscle weakness (generalized): Secondary | ICD-10-CM | POA: Diagnosis not present

## 2021-04-18 DIAGNOSIS — M25672 Stiffness of left ankle, not elsewhere classified: Secondary | ICD-10-CM

## 2021-04-18 DIAGNOSIS — R2689 Other abnormalities of gait and mobility: Secondary | ICD-10-CM

## 2021-04-18 DIAGNOSIS — R262 Difficulty in walking, not elsewhere classified: Secondary | ICD-10-CM

## 2021-04-18 DIAGNOSIS — M25641 Stiffness of right hand, not elsewhere classified: Secondary | ICD-10-CM
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
## 2021-04-18 NOTE — Therapy (Signed)
[facility] [address] [place], [place], [address] Phone: [phone]   Fax:  [phone]  Physical Therapy Treatment  Patient Details  Name: Joanna Wright MRN: [id number] Date of Birth: 1971-02-19 Referring Provider (PT): Referred by Lauraine Rinne. Followed by Leeroy Cha, MD. Surgeon Katha Hamming, MD)   Encounter Date: 04/18/2021   PT End of Session - 04/18/21 1230     Visit Number 7    Number of Visits 11    Date for PT Re-Evaluation 04/22/21    Authorization Type [employer]: insurance reset on 03/23/21 with 25 visits for OT, 25 vists for PT per pt report    Authorization - Visit Number 4    Authorization - Number of Visits 25    PT Start Time [id number]    PT Stop Time 1314    PT Time Calculation (min) 44 min    Equipment Utilized During Treatment Gait belt    Activity Tolerance Patient tolerated treatment well    Behavior During Therapy WFL for tasks assessed/performed             Past Medical History:  Diagnosis Date   DUB (dysfunctional uterine bleeding)    Hx   Hypertension    MHA (microangiopathic hemolytic anemia) (HCC)    Hx   Night sweats    Hx   Psoriasis of scalp     Past Surgical History:  Procedure Laterality Date   ACHILLES TENDON REPAIR Right 2010   BREAST BIOPSY     BREAST EXCISIONAL BIOPSY     CERVICAL POLYPECTOMY N/A 05/19/2014   Procedure: CERVICAL POLYPECTOMY;  Surgeon: Sharene Butters, MD;  Location: [facility];  Service: Gynecology;  Laterality: N/A;   EXTERNAL FIXATION LEG Left 01/03/2021   Procedure: EXTERNAL FIXATION ANKLE LEFT and right distal radius fracture ORIF;  Surgeon: Shona Needles, MD;  Location: [facility];  Service: Orthopedics;  Laterality: Left;   EXTERNAL FIXATION REMOVAL Left 01/08/2021   Procedure: REMOVAL EXTERNAL FIXATION LEG;  Surgeon: Shona Needles, MD;  Location: [facility];  Service: Orthopedics;  Laterality: Left;   HARDWARE REMOVAL Right 03/02/2021    Procedure: HARDWARE REMOVAL RIGHT ARM AND MANIPULATION OF LEFT ANKLE;  Surgeon: Shona Needles, MD;  Location: [facility];  Service: Orthopedics;  Laterality: Right;   HYSTEROSCOPY N/A 05/19/2014   Procedure: HYSTEROSCOPY;  Surgeon: Sharene Butters, MD;  Location: [facility];  Service: Gynecology;  Laterality: N/A;   OPEN REDUCTION INTERNAL FIXATION (ORIF) TIBIA/FIBULA FRACTURE Left 01/08/2021   Procedure: OPEN REDUCTION INTERNAL FIXATION (ORIF) PILON FRACTURE;  Surgeon: Shona Needles, MD;  Location: [facility];  Service: Orthopedics;  Laterality: Left;   WISDOM TOOTH EXTRACTION      There were no vitals filed for this visit.   Subjective Assessment - 04/18/21 1234     Subjective Patient reports no new changes/complaints. No pain,but reports some mild stiffness in the ankle.    Patient is accompained by: Family member   spouse   Pertinent History HTN    Limitations Standing;House hold activities;Walking    How long can you walk comfortably? 100 ft    Currently in Pain? No/denies    Pain Onset More than a month ago                [facility] Adult PT Treatment/Exercise - 04/18/21 0001       Transfers   Transfers Sit to Stand;Stand to Sit    Sit to Stand 6: Modified independent (  Device/Increase time)    Stand to Sit 6: Modified independent (Device/Increase time)      Ambulation/Gait   Ambulation/Gait Yes    Ambulation/Gait Assistance 5: Supervision    Ambulation/Gait Assistance Details completed ambulation with RW x 115 ft with PT providing verbal cues for improved step length on RLE to promote weight shift/stance on LLE. Cues to avoid locking out LLE and keeping soft bend.    Ambulation Distance (Feet) 115 Feet    Assistive device Rolling walker    Gait Pattern Decreased arm swing - right;Decreased arm swing - left;Decreased step length - left    Ambulation Surface Level;Indoor      Exercises   Exercises Ankle;Other Exercises    Other Exercises  seated at edge of mat: completed fitter  board with LLE, completed A/P taps x 10 reps, with second set PT providing overpressure into DF and presure through knee to promote ROM. Then completed lateral taps x 10 reps, followed by CW x 10 reps, with intermittent assistance from PT for full range around circles due to stiffness.      Knee/Hip Exercises: Aerobic   Other Aerobic Scifit LE's and BUE on level 3.5 x 8 minutes with goal >/= 90 steps per minute for strengthening, heel cord stretching (pt cued to keep heel down on peddle) and to promote weight through left LE.      Knee/Hip Exercises: Machines for Strengthening   Cybex Leg Press bil LE's- 110# 1 sets of 10 reps, then left LE only 55# 3 sets of 10 reps, cues for full range and slow, controlled movement.      Ankle Exercises: Standing   Other Standing Ankle Exercises Completed step ups to 6" step with BUE support from rails, completed x 15 reps with standing rest break required after 10 then completed last 5 reps. Cues for posture and reduced pushing with UE support.               PT Education - 04/18/21 1325     Education Details Scheduling out more Visits    Person(s) Educated Patient    Methods Explanation    Comprehension Verbalized understanding              PT Short Term Goals - 03/14/21 1429       PT SHORT TERM GOAL #1   Title Patient will be independent with intial strengtheing/ROM/balance HEP (all STGs Due: 02/26/21)    Baseline initiated HEP on 6/22    Time 5    Period Weeks    Status Achieved    Target Date 02/26/21      PT SHORT TERM GOAL #2   Title Patient will undergo further strength/ROM testing upon MD clearance and LTG to be set as applicable    Baseline see flow sheet; assessed on 6/22    Time 5    Period Weeks    Status Achieved               PT Long Term Goals - 03/14/21 1435       PT LONG TERM GOAL #1   Title Patient will be independent with Final HEP for ROM/Strengthening/Balance (All LTGs Due; 04/02/21)    Baseline no HEP  established    Time 10    Period Weeks    Status New      PT LONG TERM GOAL #2   Title Patient will improve gait speed to >/= 1.5 ft/sec with LRAD to demonstrate imrpoved household/community mobility  Baseline 0.78 ft/sec    Time 10    Period Weeks    Status New      PT LONG TERM GOAL #3   Title Patient will improve DF to >/= neutral (0 deg) and PF to >/= 25 degs    Baseline DF -5 deg, PF 14 deg    Time 10    Period Weeks    Status Revised      PT LONG TERM GOAL #4   Title Patient will improve 5x sit <> stand to <12 seconds to demonstrate improved balance and functional mobility    Baseline 16.25 secs    Time 10    Period Weeks    Status New      PT LONG TERM GOAL #5   Title Patient will be able to ambulate >/= 500 ft w/ LRAD vs. No AD with step through pattern to demonstrate improved activity tolerance    Baseline 70 ft use of PFRW    Time 10    Period Weeks    Status New                   Plan - 04/18/21 1326     Clinical Impression Statement Due to increase in VL, PT educating on getting scheduled out for more visits and increasing to 2x/week with POC. Will plan to assess goals next week and complete Re-Cert. Today's session focused on improved ankle ROM, gait training with focus on improved stance/step length, as well as continued strength training. Will continue to progress toward all LTGs.    Personal Factors and Comorbidities Comorbidity 1    Comorbidities HTN    Examination-Activity Limitations Bathing;Stairs;Stand;Transfers;Locomotion Level;Dressing;Squat;Hygiene/Grooming    Examination-Participation Restrictions Driving;Community Activity;Occupation    Stability/Clinical Decision Making Stable/Uncomplicated    Rehab Potential Good    PT Frequency 1x / week    PT Duration Other (comment)   10 Weeks   PT Treatment/Interventions ADLs/Self Care Home Management;Cryotherapy;Electrical Stimulation;Moist Heat;DME Instruction;Gait training;Stair  training;Functional mobility training;Therapeutic activities;Therapeutic exercise;Balance training;Neuromuscular re-education;Cognitive remediation;Patient/family education;Manual techniques;Passive range of motion;Dry needling;Taping;Joint Manipulations    PT Next Visit Plan Complete Re-Cert for 1x/week for 1 week, followed by 2x/week for 6 weeks. Manual Therapy as tolerated to L Ankle. Continued strengthening/weight shift activities. ROM exercises for ankle. Gastroc stretch. Gait training with RW with emphasis on equal stance and step lenght, ? use of treadmil/gait trainer program.    Consulted and Agree with Plan of Care Patient;Family member/caregiver             Patient will benefit from skilled therapeutic intervention in order to improve the following deficits and impairments:  Abnormal gait, Decreased activity tolerance, Decreased endurance, Decreased knowledge of use of DME, Decreased range of motion, Decreased strength, Impaired sensation, Pain, Difficulty walking, Decreased balance  Visit Diagnosis: Muscle weakness (generalized)  Other abnormalities of gait and mobility  Stiffness of left ankle, not elsewhere classified  Difficulty in walking, not elsewhere classified     Problem List Patient Active Problem List   Diagnosis Date Noted   Vitamin D deficiency 03/14/2021   Hyperlipidemia 03/14/2021   Closed fracture of right wrist 02/27/2021   Thrombocytosis    Closed fracture of left distal tibia 01/11/2021   Tibia/fibula fracture 01/03/2021   Closed left pilon fracture 01/03/2021   Closed fracture of right distal radius 01/03/2021   Closed disp fracture of base of second metacarpal bone of right hand 01/03/2021   Anemia 02/13/2015   Essential hypertension 02/13/2015  Psoriasis of scalp 06/22/2012   Preventative health care 06/20/2011   ANOSMIA 03/09/2010   Dysfunctional uterine bleeding 03/15/2008   CONTACT DERMATITIS&OTH ECZEMA DUE OTH [facility] AGENT 03/15/2008     Jones Bales, PT, DPT 04/18/2021, 1:29 PM  [facility] [address] [place], [place], [address] Phone: [phone]   Fax:  [phone]  Name: IEASHIA BOEGEL MRN: [id number] Date of Birth: Jan 29, 1971

## 2021-04-25 ENCOUNTER — Ambulatory Visit: Payer: No Typology Code available for payment source | Admitting: Family

## 2021-04-25 ENCOUNTER — Ambulatory Visit: Payer: PRIVATE HEALTH INSURANCE | Attending: Physician Assistant

## 2021-04-25 ENCOUNTER — Other Ambulatory Visit: Payer: Self-pay

## 2021-04-25 VITALS — BP 140/77 | HR 104 | Temp 98.6°F | Resp 16 | Wt 186.0 lb

## 2021-04-25 DIAGNOSIS — M25631 Stiffness of right wrist, not elsewhere classified: Secondary | ICD-10-CM | POA: Diagnosis present

## 2021-04-25 DIAGNOSIS — M6281 Muscle weakness (generalized): Secondary | ICD-10-CM | POA: Diagnosis present

## 2021-04-25 DIAGNOSIS — Z23 Encounter for immunization: Secondary | ICD-10-CM | POA: Diagnosis not present

## 2021-04-25 DIAGNOSIS — M25641 Stiffness of right hand, not elsewhere classified: Secondary | ICD-10-CM | POA: Diagnosis present

## 2021-04-25 DIAGNOSIS — R2689 Other abnormalities of gait and mobility: Secondary | ICD-10-CM | POA: Insufficient documentation

## 2021-04-25 DIAGNOSIS — M79641 Pain in right hand: Secondary | ICD-10-CM | POA: Insufficient documentation

## 2021-04-25 DIAGNOSIS — R278 Other lack of coordination: Secondary | ICD-10-CM | POA: Insufficient documentation

## 2021-04-25 DIAGNOSIS — R262 Difficulty in walking, not elsewhere classified: Secondary | ICD-10-CM | POA: Insufficient documentation

## 2021-04-25 DIAGNOSIS — M25672 Stiffness of left ankle, not elsewhere classified: Secondary | ICD-10-CM | POA: Diagnosis present

## 2021-04-25 DIAGNOSIS — R232 Flushing: Secondary | ICD-10-CM | POA: Diagnosis not present

## 2021-04-25 DIAGNOSIS — N912 Amenorrhea, unspecified: Secondary | ICD-10-CM | POA: Insufficient documentation

## 2021-04-25 LAB — FOLLICLE STIMULATING HORMONE: FSH: 8.7 m[IU]/mL

## 2021-04-25 LAB — LUTEINIZING HORMONE: LH: 13.69 m[IU]/mL

## 2021-04-25 NOTE — Assessment & Plan Note (Signed)
Will check Urine pregnancy test.
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
## 2021-04-25 NOTE — Assessment & Plan Note (Signed)
New.  ? If she is menopausal at this point. Will check hormonal studies.  Further contraception recommendations following review of labs.

## 2021-04-25 NOTE — Patient Instructions (Signed)
Please complete lab work prior to leaving. We will call you with further instructions after we review your lab work.

## 2021-04-25 NOTE — Therapy (Signed)
[facility] [address] [place], [place], [address] Phone: [phone]   Fax:  [phone]  Physical Therapy Treatment/Re-Certification  Patient Details  Name: Joanna Wright MRN: [id number] Date of Birth: 09/21/1971 Referring Provider (PT): Referred by Lauraine Rinne. Followed by Leeroy Cha, MD. Surgeon Katha Hamming, MD)   Encounter Date: 04/25/2021   PT End of Session - 04/25/21 1227     Visit Number 8    Number of Visits 20    Date for PT Re-Evaluation 06/13/21    Authorization Type [employer]: insurance reset on 03/23/21 with 25 visits for OT, 25 vists for PT per pt report    Authorization - Visit Number 5    Authorization - Number of Visits 25    PT Start Time 8242    PT Stop Time 1314    PT Time Calculation (min) 44 min    Equipment Utilized During Treatment Gait belt    Activity Tolerance Patient tolerated treatment well    Behavior During Therapy WFL for tasks assessed/performed             Past Medical History:  Diagnosis Date   DUB (dysfunctional uterine bleeding)    Hx   Hypertension    MHA (microangiopathic hemolytic anemia) (HCC)    Hx   Night sweats    Hx   Psoriasis of scalp     Past Surgical History:  Procedure Laterality Date   ACHILLES TENDON REPAIR Right 2010   BREAST BIOPSY     BREAST EXCISIONAL BIOPSY     CERVICAL POLYPECTOMY N/A 05/19/2014   Procedure: CERVICAL POLYPECTOMY;  Surgeon: Sharene Butters, MD;  Location: [facility];  Service: Gynecology;  Laterality: N/A;   EXTERNAL FIXATION LEG Left 01/03/2021   Procedure: EXTERNAL FIXATION ANKLE LEFT and right distal radius fracture ORIF;  Surgeon: Shona Needles, MD;  Location: [facility];  Service: Orthopedics;  Laterality: Left;   EXTERNAL FIXATION REMOVAL Left 01/08/2021   Procedure: REMOVAL EXTERNAL FIXATION LEG;  Surgeon: Shona Needles, MD;  Location: [facility];  Service: Orthopedics;  Laterality: Left;   HARDWARE REMOVAL Right  03/02/2021   Procedure: HARDWARE REMOVAL RIGHT ARM AND MANIPULATION OF LEFT ANKLE;  Surgeon: Shona Needles, MD;  Location: [facility];  Service: Orthopedics;  Laterality: Right;   HYSTEROSCOPY N/A 05/19/2014   Procedure: HYSTEROSCOPY;  Surgeon: Sharene Butters, MD;  Location: [facility];  Service: Gynecology;  Laterality: N/A;   OPEN REDUCTION INTERNAL FIXATION (ORIF) TIBIA/FIBULA FRACTURE Left 01/08/2021   Procedure: OPEN REDUCTION INTERNAL FIXATION (ORIF) PILON FRACTURE;  Surgeon: Shona Needles, MD;  Location: [facility];  Service: Orthopedics;  Laterality: Left;   WISDOM TOOTH EXTRACTION      There were no vitals filed for this visit.   Subjective Assessment - 04/25/21 1230     Subjective No new changes. Had appt with PCP this morning, had blood drawn.    Patient is accompained by: Family member   spouse   Pertinent History HTN    Limitations Standing;House hold activities;Walking    How long can you walk comfortably? 100 ft    Currently in Pain? Yes    Pain Score 4     Pain Location Wrist    Pain Orientation Right    Pain Descriptors / Indicators Aching    Pain Type Acute pain;Surgical pain    Pain Onset More than a month ago  [facility] PT Assessment - 04/25/21 0001       Assessment   Medical Diagnosis s/p ORIF Rt distal radius fx and Rt 2nd metacarpal fx    Referring Provider (PT) Referred by Lauraine Rinne. Followed by Leeroy Cha, MD. Surgeon Lennette Bihari Haddix, MD)      ROM / Strength   AROM / PROM / Strength PROM;AROM      AROM   Overall AROM  Deficits    AROM Assessment Site Ankle    Left Ankle Dorsiflexion -4    Left Ankle Plantar Flexion 16      PROM   Overall PROM  Deficits    PROM Assessment Site Ankle    Right/Left Ankle Left    Left Ankle Dorsiflexion -4    Left Ankle Plantar Flexion 18   slight pain at end range     Transfers   Transfers Sit to Stand;Stand to Sit    Sit to Stand 6: Modified independent (Device/Increase time)    Five time sit  to stand comments  13.01 secs with no UE support    Stand to Sit 6: Modified independent (Device/Increase time)      Ambulation/Gait   Ambulation/Gait Yes    Ambulation/Gait Assistance 5: Supervision    Assistive device Rolling walker    Gait Pattern Decreased arm swing - right;Decreased arm swing - left;Decreased step length - left    Ambulation Surface Level;Indoor    Gait velocity 25.16 secs = 1.30 ft/sec with PFRW             [facility] Adult PT Treatment/Exercise - 04/25/21 0001       Transfers   Number of Reps 10 reps;1 set    Comments completed sit <> stand with LLE slight posterior, to promote strengthening, completed x 10 reps. PT having to block RLE as patient tends to want to pull it back to have feet even.      Ambulation/Gait   Ambulation/Gait Assistance Details completed gait training with RW (platform still placed on R side)    Ambulation Distance (Feet) 540 Feet      Neuro Re-ed    Neuro Re-ed Details  standing without UE support working on steps forward/posterior with RLE to promote weight shift and stance time on LLE. PT providing faciliation and tactile cues at pelvis as patient tends to not want to shift weight onto L side.      Exercises   Exercises Ankle    Other Exercises  seated at edge of mat: completed fitter board with LLE, completed A/P taps x 10 reps, with second set PT providing overpressure into DF and presure through knee to promote ROM. Then completed lateral taps x 10 reps.                    PT Education - 04/25/21 1228     Education Details Progress toward LTG's; Updated POC    Person(s) Educated Patient    Methods Explanation    Comprehension Verbalized understanding              PT Short Term Goals - 03/14/21 1429       PT SHORT TERM GOAL #1   Title Patient will be independent with intial strengtheing/ROM/balance HEP (all STGs Due: 02/26/21)    Baseline initiated HEP on 6/22    Time 5    Period Weeks    Status Achieved     Target Date 02/26/21      PT SHORT TERM  GOAL #2   Title Patient will undergo further strength/ROM testing upon MD clearance and LTG to be set as applicable    Baseline see flow sheet; assessed on 6/22    Time 5    Period Weeks    Status Achieved               PT Long Term Goals - 04/25/21 1235       PT LONG TERM GOAL #1   Title Patient will be independent with Final HEP for ROM/Strengthening/Balance (All LTGs Due; 04/02/21)    Baseline no HEP established; reports independence with HEP, reports completing 3x/week    Time 10    Period Weeks    Status Achieved      PT LONG TERM GOAL #2   Title Patient will improve gait speed to >/= 1.5 ft/sec with LRAD to demonstrate imrpoved household/community mobility    Baseline 0.78 ft/sec; 1.3 ft/sec    Time 10    Period Weeks    Status Not Met      PT LONG TERM GOAL #3   Title Patient will improve DF to >/= neutral (0 deg) and PF to >/= 25 degs    Baseline DF -5 deg, PF 14 deg; DF - 4 deg, PF: 18    Time 10    Period Weeks    Status Not Met      PT LONG TERM GOAL #4   Title Patient will improve 5x sit <> stand to <12 seconds to demonstrate improved balance and functional mobility    Baseline 16.25 secs; 13.01 secs    Time 10    Period Weeks    Status Not Met      PT LONG TERM GOAL #5   Title Patient will be able to ambulate >/= 500 ft w/ LRAD vs. No AD with step through pattern to demonstrate improved activity tolerance    Baseline 70 ft use of PFRW; 530 ft w/ RW (platform still on R side) improved step through pattern, but shortened step due to antalgic gait pattern    Time 10    Period Weeks    Status Partially Met             Updated Short Term Goals:   PT Short Term Goals - 04/25/21 1326       PT SHORT TERM GOAL #1   Title Patient will be able to ambulate >/= 500 ft with indoors level surface SPC vs no AD with improved gait pattern noted    Baseline decreaed step length    Time 3    Status New    Target  Date 05/16/21      PT SHORT TERM GOAL #2   Title Will assess SLS tolerance and LTG set    Baseline TBA    Time 3    Period Weeks    Status New                Updated Long Term Goals:    PT Long Term Goals - 04/25/21 1327       PT LONG TERM GOAL #1   Title Patient will be independent with Final HEP for ROM/Strengthening/Balance (All LTGs Due; 06/13/21)    Baseline no HEP established; reports independence with HEP, reports completing 3x/week, continue to benefit from progressive HEP    Time 7    Period Weeks    Status On-going    Target Date 06/13/21      PT  LONG TERM GOAL #2   Title Patient will improve gait speed to >/= 1.5 ft/sec with LRAD to demonstrate imrpoved household/community mobility    Baseline 0.78 ft/sec; 1.3 ft/sec    Time 7    Period Weeks    Status On-going      PT LONG TERM GOAL #3   Title Patient will improve DF to >/= neutral (0 deg) and PF to >/= 25 degs    Baseline DF -5 deg, PF 14 deg; DF - 4 deg, PF: 18    Time 7    Period Weeks    Status On-going      PT LONG TERM GOAL #4   Title Patient will improve 5x sit <> stand to <12 seconds to demonstrate improved balance and functional mobility    Baseline 16.25 secs; 13.01 secs    Time 7    Period Weeks    Status On-going      PT LONG TERM GOAL #5   Title Patient will be able to ambulate >/= 300 ft outdoors w/ LRAD and supervision    Baseline TBA    Time 7    Period Weeks    Status New      Additional Long Term Goals   Additional Long Term Goals Yes      PT LONG TERM GOAL #6   Title LTG to be set for SLS on LLE    Baseline TBA    Time 7    Period Weeks    Status New                 Plan - 04/25/21 1229     Clinical Impression Statement Completed assesment of patient's progress toward LTG. Patient able to meet/partially meet LTG #1 and 5. Patient made progress toward all other LTG but not to goal level. Patient has improved gait speed to 1.30 ft/sec but continues to use RW  at this time, with shortened step length noted on RLE due to decreased weight shift on LLE. Patient also continues to demonstrate decreased A/PROM of L ankle. Patient is making slow steady progress with PT services, and will continue to benefit from skilled PT services to progress toward all LTGs.    Personal Factors and Comorbidities Comorbidity 1    Comorbidities HTN    Examination-Activity Limitations Bathing;Stairs;Stand;Transfers;Locomotion Level;Dressing;Squat;Hygiene/Grooming    Examination-Participation Restrictions Driving;Community Activity;Occupation    Stability/Clinical Decision Making Stable/Uncomplicated    Rehab Potential Good    PT Frequency 2x / week    PT Duration 6 weeks   plus 1x/week for 1 week at start of POC   PT Treatment/Interventions ADLs/Self Care Home Management;Cryotherapy;Electrical Stimulation;Moist Heat;DME Instruction;Gait training;Stair training;Functional mobility training;Therapeutic activities;Therapeutic exercise;Balance training;Neuromuscular re-education;Cognitive remediation;Patient/family education;Manual techniques;Passive range of motion;Dry needling;Taping;Joint Manipulations    PT Next Visit Plan Manual Therapy as tolerated to L Ankle. Continued strengthening/weight shift activities. ROM exercises for ankle. Gastroc stretch. Gait training with RW with emphasis on equal stance and step lenght, ? use of treadmil/gait trainer program.    Consulted and Agree with Plan of Care Patient;Family member/caregiver             Patient will benefit from skilled therapeutic intervention in order to improve the following deficits and impairments:  Abnormal gait, Decreased activity tolerance, Decreased endurance, Decreased knowledge of use of DME, Decreased range of motion, Decreased strength, Impaired sensation, Pain, Difficulty walking, Decreased balance  Visit Diagnosis: Difficulty in walking, not elsewhere classified  Muscle weakness (generalized)  Other  abnormalities of gait and mobility  Stiffness of left ankle, not elsewhere classified     Problem List Patient Active Problem List   Diagnosis Date Noted   Hot flashes 04/25/2021   Amenorrhea 04/25/2021   Vitamin D deficiency 03/14/2021   Hyperlipidemia 03/14/2021   Closed fracture of right wrist 02/27/2021   Thrombocytosis    Closed fracture of left distal tibia 01/11/2021   Tibia/fibula fracture 01/03/2021   Closed left pilon fracture 01/03/2021   Closed fracture of right distal radius 01/03/2021   Closed disp fracture of base of second metacarpal bone of right hand 01/03/2021   Anemia 02/13/2015   Essential hypertension 02/13/2015   Psoriasis of scalp 06/22/2012   Preventative health care 06/20/2011   ANOSMIA 03/09/2010   Dysfunctional uterine bleeding 03/15/2008   CONTACT DERMATITIS&OTH ECZEMA DUE [facility]TH [facility] AGENT 03/15/2008    Jones Bales, PT, DPT 04/25/2021, 1:25 PM  [facility] [address] [place], [place], [address] Phone: [phone]   Fax:  [phone]  Name: Joanna Wright MRN: [id number] Date of Birth: June 21, 1971

## 2021-04-26 ENCOUNTER — Encounter: Payer: Self-pay | Admitting: Family

## 2021-04-26 LAB — PREGNANCY, URINE: Preg Test, Ur: NEGATIVE

## 2021-04-26 LAB — ESTRADIOL: Estradiol: 149 pg/mL

## 2021-04-27 MED ORDER — NORETHINDRONE 0.35 MG PO TABS: 1.0000 | ORAL_TABLET | Freq: Every day | ORAL | 11 refills | Status: DC

## 2021-04-30 ENCOUNTER — Ambulatory Visit: Payer: PRIVATE HEALTH INSURANCE | Admitting: Occupational Therapy

## 2021-05-01 ENCOUNTER — Ambulatory Visit (HOSPITAL_BASED_OUTPATIENT_CLINIC_OR_DEPARTMENT_OTHER): Payer: No Typology Code available for payment source

## 2021-05-02 ENCOUNTER — Ambulatory Visit: Payer: PRIVATE HEALTH INSURANCE | Admitting: Occupational Therapy

## 2021-05-02 ENCOUNTER — Other Ambulatory Visit: Payer: Self-pay

## 2021-05-02 ENCOUNTER — Ambulatory Visit: Payer: PRIVATE HEALTH INSURANCE

## 2021-05-02 DIAGNOSIS — R262 Difficulty in walking, not elsewhere classified: Secondary | ICD-10-CM

## 2021-05-02 DIAGNOSIS — M25641 Stiffness of right hand, not elsewhere classified: Secondary | ICD-10-CM

## 2021-05-02 DIAGNOSIS — M6281 Muscle weakness (generalized): Secondary | ICD-10-CM

## 2021-05-02 DIAGNOSIS — R2689 Other abnormalities of gait and mobility: Secondary | ICD-10-CM

## 2021-05-02 DIAGNOSIS — M25672 Stiffness of left ankle, not elsewhere classified: Secondary | ICD-10-CM

## 2021-05-02 DIAGNOSIS — M25631 Stiffness of right wrist, not elsewhere classified: Secondary | ICD-10-CM
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
## 2021-05-02 NOTE — Therapy (Signed)
[facility] [address] [place], [place], [address] Phone: [phone]   Fax:  [phone]  Physical Therapy Treatment  Patient Details  Name: LAQUETTA DELISE MRN: [id number] Date of Birth: 1970/09/28 Referring Provider (PT): Referred by Lauraine Rinne. Followed by Leeroy Cha, MD. Surgeon Katha Hamming, MD)   Encounter Date: 05/02/2021   PT End of Session - 05/02/21 1236     Visit Number 9    Number of Visits 20    Date for PT Re-Evaluation 06/13/21    Authorization Type [employer]: insurance reset on 03/23/21 with 25 visits for OT, 25 vists for PT per pt report    Authorization - Visit Number 6    Authorization - Number of Visits 25    PT Start Time [id number]    PT Stop Time 1315    PT Time Calculation (min) 44 min    Equipment Utilized During Treatment Gait belt    Activity Tolerance Patient tolerated treatment well    Behavior During Therapy WFL for tasks assessed/performed             Past Medical History:  Diagnosis Date   DUB (dysfunctional uterine bleeding)    Hx   Hypertension    MHA (microangiopathic hemolytic anemia) (HCC)    Hx   Night sweats    Hx   Psoriasis of scalp     Past Surgical History:  Procedure Laterality Date   ACHILLES TENDON REPAIR Right 2010   BREAST BIOPSY     BREAST EXCISIONAL BIOPSY     CERVICAL POLYPECTOMY N/A 05/19/2014   Procedure: CERVICAL POLYPECTOMY;  Surgeon: Sharene Butters, MD;  Location: [facility];  Service: Gynecology;  Laterality: N/A;   EXTERNAL FIXATION LEG Left 01/03/2021   Procedure: EXTERNAL FIXATION ANKLE LEFT and right distal radius fracture ORIF;  Surgeon: Shona Needles, MD;  Location: [facility];  Service: Orthopedics;  Laterality: Left;   EXTERNAL FIXATION REMOVAL Left 01/08/2021   Procedure: REMOVAL EXTERNAL FIXATION LEG;  Surgeon: Shona Needles, MD;  Location: [facility];  Service: Orthopedics;  Laterality: Left;   HARDWARE REMOVAL Right 03/02/2021    Procedure: HARDWARE REMOVAL RIGHT ARM AND MANIPULATION OF LEFT ANKLE;  Surgeon: Shona Needles, MD;  Location: [facility];  Service: Orthopedics;  Laterality: Right;   HYSTEROSCOPY N/A 05/19/2014   Procedure: HYSTEROSCOPY;  Surgeon: Sharene Butters, MD;  Location: [facility];  Service: Gynecology;  Laterality: N/A;   OPEN REDUCTION INTERNAL FIXATION (ORIF) TIBIA/FIBULA FRACTURE Left 01/08/2021   Procedure: OPEN REDUCTION INTERNAL FIXATION (ORIF) PILON FRACTURE;  Surgeon: Shona Needles, MD;  Location: [facility];  Service: Orthopedics;  Laterality: Left;   WISDOM TOOTH EXTRACTION      There were no vitals filed for this visit.   Subjective Assessment - 05/02/21 1234     Subjective Patient reports some tingling in the ankle. No new changes/complaints.Patient reporting she tried to take a few steps without support.    Patient is accompained by: Family member   spouse   Pertinent History HTN    Limitations Standing;House hold activities;Walking    How long can you walk comfortably? 100 ft    Currently in Pain? Yes    Pain Score 4     Pain Location Toe (Comment which one)   1st toe   Pain Orientation Left    Pain Descriptors / Indicators Tingling    Pain Type Acute pain    Pain Onset More than a month ago  [facility] PT Treatment/Exercise - 05/02/21 0001       Transfers   Transfers Sit to Stand;Stand to Sit    Comments completed sit <> stand with LLE slight posterior x 10 reps. no UE support utilized today.      Ambulation/Gait   Ambulation/Gait Yes    Ambulation/Gait Assistance 5: Supervision    Ambulation/Gait Assistance Details completed gait training with RW into session x 70 ft, cues for increased step length on RLE to promote weight shift on LLE. Then completed gait training withotu AD and PT providing faciliation at pelvis for improved dweight shift, 3 x 35'. Cues for looking upright with mirror as visual feedback. As well as continued  focus on step length to promote weight shift.    Ambulation Distance (Feet) 35 Feet   x 3, 70 x 1   Assistive device Rolling walker    Gait Pattern Decreased arm swing - right;Decreased arm swing - left;Decreased step length - left    Ambulation Surface Level;Indoor      Exercises   Exercises Knee/Hip;Ankle      Knee/Hip Exercises: Aerobic   Other Aerobic Scifit BLE's and BUE on level 3.5 x 7 minutes with focus keeping heel down to promote heel cord stretching, intemrittent cues required, to promote LLE stretching/strengthening.      Knee/Hip Exercises: Machines for Strengthening   Cybex Leg Press bil LE's- 120# 1 sets of 10 reps, tolerated progression of resistance well.  then left LE only 55# x 1 set of 10 reps, followed by progression to 60# 1 sets of 10 reps, cues for full range and slow, controlled movement.      Knee/Hip Exercises: Standing   Heel Raises Both;2 sets;10 reps;3 seconds    Heel Raises Limitations focus on eccentric control, BUE support from counter.    Side Lunges Left;1 set;10 reps;Limitations    Side Lunges Limitations completed lateral lunges to L side to promote weight shift onto LLE, completed x 10 reps. PT providing cues for proper completion.              PT Education - 05/02/21 1416     Education Details bring brace into session for R wrist so can trial other AD    Person(s) Educated Patient    Methods Explanation    Comprehension Verbalized understanding              PT Short Term Goals - 04/25/21 1326       PT SHORT TERM GOAL #1   Title Patient will be able to ambulate >/= 500 ft with indoors level surface SPC vs no AD with improved gait pattern noted    Baseline decreaed step length    Time 3    Status New    Target Date 05/16/21      PT SHORT TERM GOAL #2   Title Will assess SLS tolerance and LTG set    Baseline TBA    Time 3    Period Weeks    Status New               PT Long Term Goals - 04/25/21 1327       PT LONG  TERM GOAL #1   Title Patient will be independent with Final HEP for ROM/Strengthening/Balance (All LTGs Due; 06/13/21)    Baseline no HEP established; reports independence with HEP, reports completing 3x/week, continue to benefit from progressive HEP    Time 7    Period Weeks  Status On-going    Target Date 06/13/21      PT LONG TERM GOAL #2   Title Patient will improve gait speed to >/= 1.5 ft/sec with LRAD to demonstrate imrpoved household/community mobility    Baseline 0.78 ft/sec; 1.3 ft/sec    Time 7    Period Weeks    Status On-going      PT LONG TERM GOAL #3   Title Patient will improve DF to >/= neutral (0 deg) and PF to >/= 25 degs    Baseline DF -5 deg, PF 14 deg; DF - 4 deg, PF: 18    Time 7    Period Weeks    Status On-going      PT LONG TERM GOAL #4   Title Patient will improve 5x sit <> stand to <12 seconds to demonstrate improved balance and functional mobility    Baseline 16.25 secs; 13.01 secs    Time 7    Period Weeks    Status On-going      PT LONG TERM GOAL #5   Title Patient will be able to ambulate >/= 300 ft outdoors w/ LRAD and supervision    Baseline TBA    Time 7    Period Weeks    Status New      Additional Long Term Goals   Additional Long Term Goals Yes      PT LONG TERM GOAL #6   Title LTG to be set for SLS on LLE    Baseline TBA    Time 7    Period Weeks    Status New                   Plan - 05/02/21 1242     Clinical Impression Statement Today's skilled session focused on continued gait training with RW as well as without RW to reduce UE support and promote step length. CGA required and faciliation for improved step length. Continued activities foucsed on weight shift on LLE. Patient continue to be self limiting with weight shift, due to fear. Will continue to progress toward all LTGs as tolerated by patient.    Personal Factors and Comorbidities Comorbidity 1    Comorbidities HTN    Examination-Activity Limitations  Bathing;Stairs;Stand;Transfers;Locomotion Level;Dressing;Squat;Hygiene/Grooming    Examination-Participation Restrictions Driving;Community Activity;Occupation    Stability/Clinical Decision Making Stable/Uncomplicated    Rehab Potential Good    PT Frequency 2x / week    PT Duration 6 weeks   plus 1x/week for 1 week at start of POC   PT Treatment/Interventions ADLs/Self Care Home Management;Cryotherapy;Electrical Stimulation;Moist Heat;DME Instruction;Gait training;Stair training;Functional mobility training;Therapeutic activities;Therapeutic exercise;Balance training;Neuromuscular re-education;Cognitive remediation;Patient/family education;Manual techniques;Passive range of motion;Dry needling;Taping;Joint Manipulations    PT Next Visit Plan Manual Therapy as tolerated to L Ankle. Continued strengthening/weight shift activities. ROM exercises for ankle. Gastroc stretch. Gait training with RW with emphasis on equal stance and step lenght, ? use of treadmil/gait trainer program.    Consulted and Agree with Plan of Care Patient;Family member/caregiver             Patient will benefit from skilled therapeutic intervention in order to improve the following deficits and impairments:  Abnormal gait, Decreased activity tolerance, Decreased endurance, Decreased knowledge of use of DME, Decreased range of motion, Decreased strength, Impaired sensation, Pain, Difficulty walking, Decreased balance  Visit Diagnosis: Difficulty in walking, not elsewhere classified  Muscle weakness (generalized)  Other abnormalities of gait and mobility  Stiffness of left ankle, not elsewhere classified  Problem List Patient Active Problem List   Diagnosis Date Noted   Hot flashes 04/25/2021   Amenorrhea 04/25/2021   Vitamin D deficiency 03/14/2021   Hyperlipidemia 03/14/2021   Closed fracture of right wrist 02/27/2021   Thrombocytosis    Closed fracture of left distal tibia 01/11/2021   Tibia/fibula  fracture 01/03/2021   Closed left pilon fracture 01/03/2021   Closed fracture of right distal radius 01/03/2021   Closed disp fracture of base of second metacarpal bone of right hand 01/03/2021   Anemia 02/13/2015   Essential hypertension 02/13/2015   Psoriasis of scalp 06/22/2012   Preventative health care 06/20/2011   ANOSMIA 03/09/2010   Dysfunctional uterine bleeding 03/15/2008   CONTACT DERMATITIS&OTH ECZEMA DUE OTH [facility] AGENT 03/15/2008    Jones Bales, PT, DPT 05/02/2021, 2:20 PM  [facility] [address] [place], [place], [address] Phone: [phone]   Fax:  [phone]  Name: HELEN FOLCK MRN: [id number] Date of Birth: [birth year]

## 2021-05-04 ENCOUNTER — Other Ambulatory Visit: Payer: Self-pay

## 2021-05-04 ENCOUNTER — Ambulatory Visit: Payer: PRIVATE HEALTH INSURANCE | Admitting: Physical Therapy

## 2021-05-04 ENCOUNTER — Encounter: Payer: Self-pay | Admitting: Physical Therapy

## 2021-05-04 DIAGNOSIS — R2689 Other abnormalities of gait and mobility: Secondary | ICD-10-CM

## 2021-05-04 DIAGNOSIS — R262 Difficulty in walking, not elsewhere classified: Secondary | ICD-10-CM

## 2021-05-04 DIAGNOSIS — M25672 Stiffness of left ankle, not elsewhere classified: Secondary | ICD-10-CM

## 2021-05-04 DIAGNOSIS — M6281 Muscle weakness (generalized): Secondary | ICD-10-CM

## 2021-05-04 NOTE — Therapy (Signed)
[facility] [address] [place], [place], [address] Phone: [phone]   Fax:  [phone]  Physical Therapy Treatment  Patient Details  Name: Joanna Wright MRN: [id number] Date of Birth: [birth year] Referring Provider (PT): Referred by Lauraine Rinne. Followed by Leeroy Cha, MD. Surgeon Katha Hamming, MD)   Encounter Date: 05/04/2021   PT End of Session - 05/04/21 1324     Visit Number 10    Number of Visits 20    Date for PT Re-Evaluation 06/13/21    Authorization Type [employer]: insurance reset on 03/23/21 with 25 visits for OT, 25 vists for PT per pt report    Authorization - Visit Number 7    Authorization - Number of Visits 25    PT Start Time [id number]    PT Stop Time 1400    PT Time Calculation (min) 42 min    Equipment Utilized During Treatment Gait belt    Activity Tolerance Patient tolerated treatment well    Behavior During Therapy WFL for tasks assessed/performed             Past Medical History:  Diagnosis Date   DUB (dysfunctional uterine bleeding)    Hx   Hypertension    MHA (microangiopathic hemolytic anemia) (HCC)    Hx   Night sweats    Hx   Psoriasis of scalp     Past Surgical History:  Procedure Laterality Date   ACHILLES TENDON REPAIR Right 2010   BREAST BIOPSY     BREAST EXCISIONAL BIOPSY     CERVICAL POLYPECTOMY N/A 05/19/2014   Procedure: CERVICAL POLYPECTOMY;  Surgeon: Sharene Butters, MD;  Location: [facility];  Service: Gynecology;  Laterality: N/A;   EXTERNAL FIXATION LEG Left 01/03/2021   Procedure: EXTERNAL FIXATION ANKLE LEFT and right distal radius fracture ORIF;  Surgeon: Shona Needles, MD;  Location: [facility];  Service: Orthopedics;  Laterality: Left;   EXTERNAL FIXATION REMOVAL Left 01/08/2021   Procedure: REMOVAL EXTERNAL FIXATION LEG;  Surgeon: Shona Needles, MD;  Location: [facility];  Service: Orthopedics;  Laterality: Left;   HARDWARE REMOVAL Right 03/02/2021    Procedure: HARDWARE REMOVAL RIGHT ARM AND MANIPULATION OF LEFT ANKLE;  Surgeon: Shona Needles, MD;  Location: [facility];  Service: Orthopedics;  Laterality: Right;   HYSTEROSCOPY N/A 05/19/2014   Procedure: HYSTEROSCOPY;  Surgeon: Sharene Butters, MD;  Location: [facility];  Service: Gynecology;  Laterality: N/A;   OPEN REDUCTION INTERNAL FIXATION (ORIF) TIBIA/FIBULA FRACTURE Left 01/08/2021   Procedure: OPEN REDUCTION INTERNAL FIXATION (ORIF) PILON FRACTURE;  Surgeon: Shona Needles, MD;  Location: [facility];  Service: Orthopedics;  Laterality: Left;   WISDOM TOOTH EXTRACTION      There were no vitals filed for this visit.   Subjective Assessment - 05/04/21 1321     Subjective No new complaints. No falls or pain at this time. Did bring in her wrist brace for right UE. Reports putting platform back on due to increased pain in wrist when using walker with just wrist brace.    Patient is accompained by: Family member   spouse   How long can you walk comfortably? 100 ft    Currently in Pain? No/denies    Pain Score 0-No pain                     [facility] Adult PT Treatment/Exercise - 05/04/21 1325       Transfers   Transfers Sit to  Stand;Stand to Sit    Sit to Stand 6: Modified independent (Device/Increase time)    Stand to Sit 6: Modified independent (Device/Increase time)      Ambulation/Gait   Ambulation/Gait Yes    Ambulation/Gait Assistance 5: Supervision    Ambulation/Gait Assistance Details trialed cane in both right and then left hand. improved step/stride length with cane on left side moving with left LE. With cane on right pt had increased pressure on right wrist with short step lenght on right side (? to decrease time of weight bearing on right wrist). When moved to left hand pt able to progress to more reciprocal stepping pattern with increased stance time on left LE. Then had pt work with floor ladder to work on increased step/stride length. Carryover noted with 2cd lap on track  and remainder of gait in session with reminder cues at times.    Ambulation Distance (Feet) 115 Feet   x2, plus around clinic with session.   Assistive device Right platform walker;Straight cane   cane with rubber quad tip used in session. walker to enter/exit session.   Gait Pattern Decreased arm swing - right;Decreased arm swing - left;Decreased step length - left    Ambulation Surface Level;Indoor    Gait Comments use of floor ladder for 8 laps with emphasis on one foot to a square with cane on left/wrist resting on PTA hand. min guard to min assist.                 Balance Exercises - 05/04/21 1345       Balance Exercises: Standing   Step Over Hurdles / Cones hurdles next to counter top: reciprocal stepping over 4 small hurdles with counter/contralateral side HHA for 6 laps forward. Emphasis on increased hip/knee flexion to clear hurdle and step length for reciprocal pattern.    Sit to Stand Standard surface;Without upper extremity support;Foam/compliant surface;Limitations    Sit to Stand Limitations seated at edge of mat table: with right foot on green air disc- 10 reps with no UE support with min guard assist. Emphasis on tall posture with each stand and for controlled descent. Then with right foot on foam air bubble- 10 reps with no UEsupport, min guard assist with focus as before with green air disc. Pt occasionally needing UE support upon standing for balance assist.                 PT Short Term Goals - 04/25/21 1326       PT SHORT TERM GOAL #1   Title Patient will be able to ambulate >/= 500 ft with indoors level surface SPC vs no AD with improved gait pattern noted    Baseline decreaed step length    Time 3    Status New    Target Date 05/16/21      PT SHORT TERM GOAL #2   Title Will assess SLS tolerance and LTG set    Baseline TBA    Time 3    Period Weeks    Status New               PT Long Term Goals - 04/25/21 1327       PT LONG TERM GOAL  #1   Title Patient will be independent with Final HEP for ROM/Strengthening/Balance (All LTGs Due; 06/13/21)    Baseline no HEP established; reports independence with HEP, reports completing 3x/week, continue to benefit from progressive HEP    Time 7  Period Weeks    Status On-going    Target Date 06/13/21      PT LONG TERM GOAL #2   Title Patient will improve gait speed to >/= 1.5 ft/sec with LRAD to demonstrate imrpoved household/community mobility    Baseline 0.78 ft/sec; 1.3 ft/sec    Time 7    Period Weeks    Status On-going      PT LONG TERM GOAL #3   Title Patient will improve DF to >/= neutral (0 deg) and PF to >/= 25 degs    Baseline DF -5 deg, PF 14 deg; DF - 4 deg, PF: 18    Time 7    Period Weeks    Status On-going      PT LONG TERM GOAL #4   Title Patient will improve 5x sit <> stand to <12 seconds to demonstrate improved balance and functional mobility    Baseline 16.25 secs; 13.01 secs    Time 7    Period Weeks    Status On-going      PT LONG TERM GOAL #5   Title Patient will be able to ambulate >/= 300 ft outdoors w/ LRAD and supervision    Baseline TBA    Time 7    Period Weeks    Status New      Additional Long Term Goals   Additional Long Term Goals Yes      PT LONG TERM GOAL #6   Title LTG to be set for SLS on LLE    Baseline TBA    Time 7    Period Weeks    Status New                   Plan - 05/04/21 1325     Clinical Impression Statement Today's skilled session continued to focus on gait mechanics with empahsis on equal step length/increased stance time on left LE. Trialed use of straight cane today with improved gait pattern noted with use of cane on left side vs right side. The pt will benefit from continued training with cane and may progress to no device needed as pt was placing light pressure through cane with gait. Remainder of session focused on activities to promote left LE strenghtnening and weight bearing with no issues  noted or reported in session. The pt is making steady progress toward goals and should benefit from continued PT to progress toward unmet goals.    Personal Factors and Comorbidities Comorbidity 1    Comorbidities HTN    Examination-Activity Limitations Bathing;Stairs;Stand;Transfers;Locomotion Level;Dressing;Squat;Hygiene/Grooming    Examination-Participation Restrictions Driving;Community Activity;Occupation    Stability/Clinical Decision Making Stable/Uncomplicated    Rehab Potential Good    PT Frequency 2x / week    PT Duration 6 weeks   plus 1x/week for 1 week at start of POC   PT Treatment/Interventions ADLs/Self Care Home Management;Cryotherapy;Electrical Stimulation;Moist Heat;DME Instruction;Gait training;Stair training;Functional mobility training;Therapeutic activities;Therapeutic exercise;Balance training;Neuromuscular re-education;Cognitive remediation;Patient/family education;Manual techniques;Passive range of motion;Dry needling;Taping;Joint Manipulations    PT Next Visit Plan Manual Therapy as tolerated to L Ankle. Continued strengthening/weight shift activities. ROM exercises for ankle. Gastroc stretch. Gait training with RW with emphasis on equal stance and step lenght, ? use of treadmil/gait trainer program.    Consulted and Agree with Plan of Care Patient;Family member/caregiver             Patient will benefit from skilled therapeutic intervention in order to improve the following deficits and impairments:  Abnormal gait, Decreased activity  tolerance, Decreased endurance, Decreased knowledge of use of DME, Decreased range of motion, Decreased strength, Impaired sensation, Pain, Difficulty walking, Decreased balance  Visit Diagnosis: Difficulty in walking, not elsewhere classified  Muscle weakness (generalized)  Other abnormalities of gait and mobility  Stiffness of left ankle, not elsewhere classified     Problem List Patient Active Problem List   Diagnosis  Date Noted   Hot flashes 04/25/2021   Amenorrhea 04/25/2021   Vitamin D deficiency 03/14/2021   Hyperlipidemia 03/14/2021   Closed fracture of right wrist 02/27/2021   Thrombocytosis    Closed fracture of left distal tibia 01/11/2021   Tibia/fibula fracture 01/03/2021   Closed left pilon fracture 01/03/2021   Closed fracture of right distal radius 01/03/2021   Closed disp fracture of base of second metacarpal bone of right hand 01/03/2021   Anemia 02/13/2015   Essential hypertension 02/13/2015   Psoriasis of scalp 06/22/2012   Preventative health care 06/20/2011   ANOSMIA 03/09/2010   Dysfunctional uterine bleeding 03/15/2008   CONTACT DERMATITIS&OTH ECZEMA DUE OTH [facility] AGENT 03/15/2008    Willow Ora, PTA, [facility] [address], [place], [place] [address] [phone] 05/04/21, 4:05 PM   Name: Joanna Wright MRN: [id number] Date of Birth: 1971-04-14

## 2021-05-09 ENCOUNTER — Ambulatory Visit: Payer: PRIVATE HEALTH INSURANCE | Admitting: Occupational Therapy

## 2021-05-09 ENCOUNTER — Ambulatory Visit: Payer: PRIVATE HEALTH INSURANCE

## 2021-05-09 ENCOUNTER — Other Ambulatory Visit: Payer: Self-pay

## 2021-05-09 DIAGNOSIS — M25641 Stiffness of right hand, not elsewhere classified: Secondary | ICD-10-CM

## 2021-05-09 DIAGNOSIS — M6281 Muscle weakness (generalized): Secondary | ICD-10-CM

## 2021-05-09 DIAGNOSIS — M25672 Stiffness of left ankle, not elsewhere classified: Secondary | ICD-10-CM

## 2021-05-09 DIAGNOSIS — R2689 Other abnormalities of gait and mobility: Secondary | ICD-10-CM

## 2021-05-09 DIAGNOSIS — M25631 Stiffness of right wrist, not elsewhere classified: Secondary | ICD-10-CM

## 2021-05-09 DIAGNOSIS — R278 Other lack of coordination: Secondary | ICD-10-CM

## 2021-05-09 DIAGNOSIS — R262 Difficulty in walking, not elsewhere classified: Secondary | ICD-10-CM | POA: Diagnosis not present

## 2021-05-09 NOTE — Therapy (Signed)
[facility] [address] [place], [place], [address] Phone: [phone]   Fax:  [phone]  Physical Therapy Treatment  Patient Details  Name: Joanna Wright MRN: [id number] Date of Birth: 1970/11/24 Referring Provider (PT): Referred by Lauraine Rinne. Followed by Leeroy Cha, MD. Surgeon Katha Hamming, MD)   Encounter Date: 05/09/2021   PT End of Session - 05/09/21 1233     Visit Number 11    Number of Visits 20    Date for PT Re-Evaluation 06/13/21    Authorization Type [employer]: insurance reset on 03/23/21 with 25 visits for OT, 25 vists for PT per pt report    Authorization - Visit Number 8    Authorization - Number of Visits 25    PT Start Time [id number]    PT Stop Time 1314    PT Time Calculation (min) 41 min    Equipment Utilized During Treatment Gait belt    Activity Tolerance Patient tolerated treatment well    Behavior During Therapy WFL for tasks assessed/performed             Past Medical History:  Diagnosis Date   DUB (dysfunctional uterine bleeding)    Hx   Hypertension    MHA (microangiopathic hemolytic anemia) (HCC)    Hx   Night sweats    Hx   Psoriasis of scalp     Past Surgical History:  Procedure Laterality Date   ACHILLES TENDON REPAIR Right 2010   BREAST BIOPSY     BREAST EXCISIONAL BIOPSY     CERVICAL POLYPECTOMY N/A 05/19/2014   Procedure: CERVICAL POLYPECTOMY;  Surgeon: Sharene Butters, MD;  Location: [facility];  Service: Gynecology;  Laterality: N/A;   EXTERNAL FIXATION LEG Left 01/03/2021   Procedure: EXTERNAL FIXATION ANKLE LEFT and right distal radius fracture ORIF;  Surgeon: Shona Needles, MD;  Location: [facility];  Service: Orthopedics;  Laterality: Left;   EXTERNAL FIXATION REMOVAL Left 01/08/2021   Procedure: REMOVAL EXTERNAL FIXATION LEG;  Surgeon: Shona Needles, MD;  Location: [facility];  Service: Orthopedics;  Laterality: Left;   HARDWARE REMOVAL Right 03/02/2021    Procedure: HARDWARE REMOVAL RIGHT ARM AND MANIPULATION OF LEFT ANKLE;  Surgeon: Shona Needles, MD;  Location: [facility];  Service: Orthopedics;  Laterality: Right;   HYSTEROSCOPY N/A 05/19/2014   Procedure: HYSTEROSCOPY;  Surgeon: Sharene Butters, MD;  Location: [facility];  Service: Gynecology;  Laterality: N/A;   OPEN REDUCTION INTERNAL FIXATION (ORIF) TIBIA/FIBULA FRACTURE Left 01/08/2021   Procedure: OPEN REDUCTION INTERNAL FIXATION (ORIF) PILON FRACTURE;  Surgeon: Shona Needles, MD;  Location: [facility];  Service: Orthopedics;  Laterality: Left;   WISDOM TOOTH EXTRACTION      There were no vitals filed for this visit.   Subjective Assessment - 05/09/21 1235     Subjective No new changes/complaints. Had some soreness after last session.    Patient is accompained by: Family member   spouse   How long can you walk comfortably? 100 ft    Currently in Pain? No/denies                [facility] Adult PT Treatment/Exercise - 05/09/21 0001       Transfers   Transfers Sit to Stand;Stand to Sit    Number of Reps 10 reps;1 set   completed x 1 set with 2" block under RLE to promote weight shift onto LLE. no UE support utilized     Ambulation/Gait  Ambulation/Gait Yes    Ambulation/Gait Assistance 5: Supervision;4: Min guard    Ambulation/Gait Assistance Details continued gait training with SPC with quad tip x 115 ft, cues for step length required. Continued ue of SPC in L hand due to R wrist pain. Then trialed no AD working on weight shift without UE support and step length x 230 ft, improvement noted reducing from CGA to supervision. Continue to demo carryover with improved step length. only using walker into/out of session    Ambulation Distance (Feet) 115 Feet   x 1, 230 x 2   Assistive device Right platform walker;Straight cane;None    Gait Pattern Decreased arm swing - right;Decreased arm swing - left;Decreased step length - left    Ambulation Surface Level;Indoor    Gait Comments completed  use of floor ladder for 6 laps with emphasis on one foot to a square with intermittent UE support from countertop. Min Guard mainly, 1-2 instances of Min A required due to challenge with no UE support.      Neuro Re-ed    Neuro Re-ed Details  standing on firm surface completed toe tap to 1st step then 2nd step x 10 reps with single UE support, thenprogressed to no UE support x 10 reps with CGA. PT providing cues for weight shift.      Exercises   Exercises Other Exercises    Other Exercises  completed step ups on 6" step with LLE x 10 reps with BUE support, then progresed second set to single UE support on L x 10 reps.      Knee/Hip Exercises: Machines for Strengthening   Cybex Leg Press bil LE's- 120# 2 sets of 10 reps, PT placed 2" block under RLE to promote increased use of LLE. increased fatigue noted.                      PT Short Term Goals - 04/25/21 1326       PT SHORT TERM GOAL #1   Title Patient will be able to ambulate >/= 500 ft with indoors level surface SPC vs no AD with improved gait pattern noted    Baseline decreaed step length    Time 3    Status New    Target Date 05/16/21      PT SHORT TERM GOAL #2   Title Will assess SLS tolerance and LTG set    Baseline TBA    Time 3    Period Weeks    Status New               PT Long Term Goals - 04/25/21 1327       PT LONG TERM GOAL #1   Title Patient will be independent with Final HEP for ROM/Strengthening/Balance (All LTGs Due; 06/13/21)    Baseline no HEP established; reports independence with HEP, reports completing 3x/week, continue to benefit from progressive HEP    Time 7    Period Weeks    Status On-going    Target Date 06/13/21      PT LONG TERM GOAL #2   Title Patient will improve gait speed to >/= 1.5 ft/sec with LRAD to demonstrate imrpoved household/community mobility    Baseline 0.78 ft/sec; 1.3 ft/sec    Time 7    Period Weeks    Status On-going      PT LONG TERM GOAL #3    Title Patient will improve DF to >/= neutral (0 deg) and PF to >/=  25 degs    Baseline DF -5 deg, PF 14 deg; DF - 4 deg, PF: 18    Time 7    Period Weeks    Status On-going      PT LONG TERM GOAL #4   Title Patient will improve 5x sit <> stand to <12 seconds to demonstrate improved balance and functional mobility    Baseline 16.25 secs; 13.01 secs    Time 7    Period Weeks    Status On-going      PT LONG TERM GOAL #5   Title Patient will be able to ambulate >/= 300 ft outdoors w/ LRAD and supervision    Baseline TBA    Time 7    Period Weeks    Status New      Additional Long Term Goals   Additional Long Term Goals Yes      PT LONG TERM GOAL #6   Title LTG to be set for SLS on LLE    Baseline TBA    Time 7    Period Weeks    Status New                   Plan - 05/09/21 1403     Clinical Impression Statement Continued gait training with SPC with quad tip and without device, with continued focus on step length. Patient continue to prefer use of SPC on L side vs. R due to wrist pain. Continued functionals trengthening of LLE and activites to promote full weight bearing/stance. Will continue to progress toward all LTGs.    Personal Factors and Comorbidities Comorbidity 1    Comorbidities HTN    Examination-Activity Limitations Bathing;Stairs;Stand;Transfers;Locomotion Level;Dressing;Squat;Hygiene/Grooming    Examination-Participation Restrictions Driving;Community Activity;Occupation    Stability/Clinical Decision Making Stable/Uncomplicated    Rehab Potential Good    PT Frequency 2x / week    PT Duration 6 weeks   plus 1x/week for 1 week at start of POC   PT Treatment/Interventions ADLs/Self Care Home Management;Cryotherapy;Electrical Stimulation;Moist Heat;DME Instruction;Gait training;Stair training;Functional mobility training;Therapeutic activities;Therapeutic exercise;Balance training;Neuromuscular re-education;Cognitive remediation;Patient/family  education;Manual techniques;Passive range of motion;Dry needling;Taping;Joint Manipulations    PT Next Visit Plan Manual Therapy as tolerated to L Ankle. Continued strengthening/weight shift activities. ROM exercises for ankle. Gastroc stretch. Gait training with RW with emphasis on equal stance and step lenght, ? use of treadmil/gait trainer program.    Consulted and Agree with Plan of Care Patient;Family member/caregiver             Patient will benefit from skilled therapeutic intervention in order to improve the following deficits and impairments:  Abnormal gait, Decreased activity tolerance, Decreased endurance, Decreased knowledge of use of DME, Decreased range of motion, Decreased strength, Impaired sensation, Pain, Difficulty walking, Decreased balance  Visit Diagnosis: Difficulty in walking, not elsewhere classified  Muscle weakness (generalized)  Stiffness of left ankle, not elsewhere classified  Other abnormalities of gait and mobility     Problem List Patient Active Problem List   Diagnosis Date Noted   Hot flashes 04/25/2021   Amenorrhea 04/25/2021   Vitamin D deficiency 03/14/2021   Hyperlipidemia 03/14/2021   Closed fracture of right wrist 02/27/2021   Thrombocytosis    Closed fracture of left distal tibia 01/11/2021   Tibia/fibula fracture 01/03/2021   Closed left pilon fracture 01/03/2021   Closed fracture of right distal radius 01/03/2021   Closed disp fracture of base of second metacarpal bone of right hand 01/03/2021   Anemia 02/13/2015  Essential hypertension 02/13/2015   Psoriasis of scalp 06/22/2012   Preventative health care 06/20/2011   ANOSMIA 03/09/2010   Dysfunctional uterine bleeding 03/15/2008   CONTACT DERMATITIS&OTH ECZEMA DUE OTH [facility] AGENT 03/15/2008    Jones Bales, PT, DPT 05/09/2021, 2:05 PM  [facility] [address] [place], [place], [address] Phone: [phone]    Fax:  [phone]  Name: Joanna Wright MRN: [id number] Date of Birth: 10-30-1970

## 2021-05-09 NOTE — Patient Instructions (Signed)
   1) Hammer stretch - elbow bent at 90 degrees, hold hammer about 1/2 way down, turn palm up and hold 5 sec, then turn palm down and hold 5 sec. Repeat 10 times  2) Have husband keep wrist neutral while pressing and holding big knuckles down as you try and make fist x 10.   3) Hold tan foam in fingers bent, then bend big knuckles down to full fist holding foam and back up x 10.   4) Eat 50% with Rt hand, open doors, drawers, cabinets, light switches w/ Rt hand  5) Rotate small ball in fingertips both ways  6) Pick up checkers ONE at a time until you have 3 in your hand, then ONE at a time, move back to fingertips to stack. Repeat 5 times
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
## 2021-05-11 ENCOUNTER — Other Ambulatory Visit: Payer: Self-pay

## 2021-05-11 ENCOUNTER — Ambulatory Visit (AMBULATORY_SURGERY_CENTER): Payer: Self-pay | Admitting: *Deleted

## 2021-05-11 VITALS — Ht 63.0 in | Wt 186.0 lb

## 2021-05-11 DIAGNOSIS — Z1211 Encounter for screening for malignant neoplasm of colon: Secondary | ICD-10-CM

## 2021-05-11 NOTE — Progress Notes (Signed)
Patient is here in-person for PV. Patient denies any allergies to eggs or soy. Patient denies any problems with anesthesia/sedation. Patient denies any oxygen use at home. Patient denies taking any diet/weight loss medications or blood thinners. Patient is aware of our care-partner policy and Covid-19 safety protocol.   EMMI education assigned to the patient for the procedure, sent to MyChart.   Patient is COVID-19 vaccinated. 

## 2021-05-14 ENCOUNTER — Ambulatory Visit: Payer: PRIVATE HEALTH INSURANCE

## 2021-05-14 ENCOUNTER — Ambulatory Visit: Payer: PRIVATE HEALTH INSURANCE | Admitting: Occupational Therapy

## 2021-05-14 ENCOUNTER — Other Ambulatory Visit: Payer: Self-pay

## 2021-05-14 DIAGNOSIS — M25631 Stiffness of right wrist, not elsewhere classified: Secondary | ICD-10-CM

## 2021-05-14 DIAGNOSIS — M25641 Stiffness of right hand, not elsewhere classified: Secondary | ICD-10-CM

## 2021-05-14 DIAGNOSIS — M6281 Muscle weakness (generalized): Secondary | ICD-10-CM

## 2021-05-14 DIAGNOSIS — R2689 Other abnormalities of gait and mobility: Secondary | ICD-10-CM

## 2021-05-14 DIAGNOSIS — R262 Difficulty in walking, not elsewhere classified: Secondary | ICD-10-CM | POA: Diagnosis not present

## 2021-05-14 DIAGNOSIS — M25672 Stiffness of left ankle, not elsewhere classified: Secondary | ICD-10-CM

## 2021-05-14 DIAGNOSIS — R278 Other lack of coordination: Secondary | ICD-10-CM

## 2021-05-14 NOTE — Therapy (Signed)
[facility] [address] [place], [place], [address] Phone: [phone]   Fax:  [phone]  Physical Therapy Treatment  Patient Details  Name: Joanna Wright MRN: [id number] Date of Birth: 09/01/1971 Referring Provider (PT): Referred by Lauraine Rinne. Followed by Leeroy Cha, MD. Surgeon Katha Hamming, MD)   Encounter Date: 05/14/2021   PT End of Session - 05/14/21 1316     Visit Number 12    Number of Visits 20    Date for PT Re-Evaluation 06/13/21    Authorization Type [employer]: insurance reset on 03/23/21 with 25 visits for OT, 25 vists for PT per pt report    Authorization - Visit Number 9    Authorization - Number of Visits 25    PT Start Time [id number]    PT Stop Time 1400    PT Time Calculation (min) 43 min    Equipment Utilized During Treatment Gait belt    Activity Tolerance Patient tolerated treatment well    Behavior During Therapy WFL for tasks assessed/performed             Past Medical History:  Diagnosis Date   DUB (dysfunctional uterine bleeding)    Hx   Hypertension    MHA (microangiopathic hemolytic anemia) ([facility])    Hx   Night sweats    Hx   Psoriasis of scalp     Past Surgical History:  Procedure Laterality Date   ACHILLES TENDON REPAIR Right 2010   BREAST BIOPSY     BREAST EXCISIONAL BIOPSY     CERVICAL POLYPECTOMY N/A 05/19/2014   Procedure: CERVICAL POLYPECTOMY;  Surgeon: Sharene Butters, MD;  Location: [facility];  Service: Gynecology;  Laterality: N/A;   EXTERNAL FIXATION LEG Left 01/03/2021   Procedure: EXTERNAL FIXATION ANKLE LEFT and right distal radius fracture ORIF;  Surgeon: Shona Needles, MD;  Location: [facility];  Service: Orthopedics;  Laterality: Left;   EXTERNAL FIXATION REMOVAL Left 01/08/2021   Procedure: REMOVAL EXTERNAL FIXATION LEG;  Surgeon: Shona Needles, MD;  Location: [facility];  Service: Orthopedics;  Laterality: Left;   HARDWARE REMOVAL Right 03/02/2021    Procedure: HARDWARE REMOVAL RIGHT ARM AND MANIPULATION OF LEFT ANKLE;  Surgeon: Shona Needles, MD;  Location: [facility];  Service: Orthopedics;  Laterality: Right;   HYSTEROSCOPY N/A 05/19/2014   Procedure: HYSTEROSCOPY;  Surgeon: Sharene Butters, MD;  Location: [facility];  Service: Gynecology;  Laterality: N/A;   OPEN REDUCTION INTERNAL FIXATION (ORIF) TIBIA/FIBULA FRACTURE Left 01/08/2021   Procedure: OPEN REDUCTION INTERNAL FIXATION (ORIF) PILON FRACTURE;  Surgeon: Shona Needles, MD;  Location: [facility];  Service: Orthopedics;  Laterality: Left;   WISDOM TOOTH EXTRACTION      There were no vitals filed for this visit.   Subjective Assessment - 05/14/21 1321     Subjective No new changes. Did not walk in with walker today, but husband brought it.    Patient is accompained by: Family member   spouse   How long can you walk comfortably? 100 ft    Currently in Pain? Yes    Pain Score 5     Pain Location Hand    Pain Orientation Right    Pain Descriptors / Indicators Aching;Tender    Pain Type Acute pain               [facility] Adult PT Treatment/Exercise - 05/14/21 0001       Transfers   Transfers Sit to Stand;Stand to  Sit    Comments completed sit <> stand with LLE slight posterior x 10 reps. no UE support      Ambulation/Gait   Ambulation/Gait Yes    Ambulation/Gait Assistance 5: Supervision    Ambulation/Gait Assistance Details completed gait training with SPC trialed in RUE vs gait without AD. No significant difference noted in gait pattern with SPC. PT educating to begin ambulating without AD at this time. Continued to provide cues for step length intermittently.    Ambulation Distance (Feet) 300 Feet    Assistive device Straight cane;None    Gait Pattern Decreased arm swing - right;Decreased arm swing - left;Decreased step length - left    Ambulation Surface Level;Indoor      Neuro Re-ed    Neuro Re-ed Details  in // bars: completed steps forward with RLE over black balance  beam x 10 reps with BUE support, then progressed to no UE support x 5 reps. Cues for step length.      Exercises   Exercises Other Exercises    Other Exercises  completed lateral lunges to L x 15 reps, cues for technique and improved lateral weight shift.      Knee/Hip Exercises: Aerobic   Other Aerobic Scifit BLE's on level 3.0  x 7 minutes with focus keeping heel down to promote heel cord stretching, intemrittent cues required, to promote LLE stretching/strengthening.               Balance Exercises - 05/14/21 0001       Balance Exercises: Standing   SLS Eyes open;Solid surface;Upper extremity support 2;Upper extremity support 1;Time;Limitations;5 reps    SLS Time 10 seconds    SLS Limitations starting with BUE support, progressed to single UE support then no UE support. without UE support only able to hold 2-3 seconds.    SLS with Vectors Solid surface;Limitations    SLS with Vectors Limitations 2 x 10 reps to 6" step, cues for weight shift. No UE support utilized.            Access Code: [email or web address] URL: [email or web address] Date: 05/14/2021 Prepared by: Baldomero Lamy  Exercises Supine Ankle Pumps - 2 x daily - 7 x weekly - 1 sets - 10 reps Seated Ankle Alphabet - 2 x daily - 7 x weekly - 1 sets - 10 reps Long Sitting Calf Stretch with Strap - 2 x daily - 7 x weekly - 1 sets - 3 reps - 30 seconds hold Standing Single Leg Stance with Counter Support - 1 x daily - 5 x weekly - 1 sets - 4-5 reps - 10-15 seconds hold Standing Toe Taps - 1 x daily - 5 x weekly - 2 sets - 10 reps Staggered Sit-to-Stand - 1 x daily - 5 x weekly - 2 sets - 10 reps    PT Education - 05/14/21 1407     Education Details Updated HEP; Begin ambulating without AD    Person(s) Educated Patient    Methods Explanation;Handout;Demonstration    Comprehension Verbalized understanding;Returned demonstration              PT Short Term Goals - 04/25/21 1326       PT SHORT TERM  GOAL #1   Title Patient will be able to ambulate >/= 500 ft with indoors level surface SPC vs no AD with improved gait pattern noted    Baseline decreaed step length    Time 3    Status New    Target Date 05/16/21  PT SHORT TERM GOAL #2   Title Will assess SLS tolerance and LTG set    Baseline TBA    Time 3    Period Weeks    Status New               PT Long Term Goals - 04/25/21 1327       PT LONG TERM GOAL #1   Title Patient will be independent with Final HEP for ROM/Strengthening/Balance (All LTGs Due; 06/13/21)    Baseline no HEP established; reports independence with HEP, reports completing 3x/week, continue to benefit from progressive HEP    Time 7    Period Weeks    Status On-going    Target Date 06/13/21      PT LONG TERM GOAL #2   Title Patient will improve gait speed to >/= 1.5 ft/sec with LRAD to demonstrate imrpoved household/community mobility    Baseline 0.78 ft/sec; 1.3 ft/sec    Time 7    Period Weeks    Status On-going      PT LONG TERM GOAL #3   Title Patient will improve DF to >/= neutral (0 deg) and PF to >/= 25 degs    Baseline DF -5 deg, PF 14 deg; DF - 4 deg, PF: 18    Time 7    Period Weeks    Status On-going      PT LONG TERM GOAL #4   Title Patient will improve 5x sit <> stand to <12 seconds to demonstrate improved balance and functional mobility    Baseline 16.25 secs; 13.01 secs    Time 7    Period Weeks    Status On-going      PT LONG TERM GOAL #5   Title Patient will be able to ambulate >/= 300 ft outdoors w/ LRAD and supervision    Baseline TBA    Time 7    Period Weeks    Status New      Additional Long Term Goals   Additional Long Term Goals Yes      PT LONG TERM GOAL #6   Title LTG to be set for SLS on LLE    Baseline TBA    Time 7    Period Weeks    Status New                   Plan - 05/14/21 1326     Clinical Impression Statement Continued gait training without AD and with SPC. Trialed SPC in R  Hand again today, with patient tolerating well. However no signifcant gait difference with AD vs. without therefore PT cleared patient to ambulate without AD at this time. Continued activites promoting LLE weight shift/stance and strengthening activites. Updated HEP. Will continue to progress toward all LTGs.    Personal Factors and Comorbidities Comorbidity 1    Comorbidities HTN    Examination-Activity Limitations Bathing;Stairs;Stand;Transfers;Locomotion Level;Dressing;Squat;Hygiene/Grooming    Examination-Participation Restrictions Driving;Community Activity;Occupation    Stability/Clinical Decision Making Stable/Uncomplicated    Rehab Potential Good    PT Frequency 2x / week    PT Duration 6 weeks   plus 1x/week for 1 week at start of POC   PT Treatment/Interventions ADLs/Self Care Home Management;Cryotherapy;Electrical Stimulation;Moist Heat;DME Instruction;Gait training;Stair training;Functional mobility training;Therapeutic activities;Therapeutic exercise;Balance training;Neuromuscular re-education;Cognitive remediation;Patient/family education;Manual techniques;Passive range of motion;Dry needling;Taping;Joint Manipulations    PT Next Visit Plan How was new exercises? Manual Therapy as tolerated to L Ankle. Continued strengthening/weight shift activities. ROM exercises for ankle. Gastroc  stretch. Gait training with RW with emphasis on equal stance and step lenght, ? use of treadmil/gait trainer program.    Consulted and Agree with Plan of Care Patient;Family member/caregiver             Patient will benefit from skilled therapeutic intervention in order to improve the following deficits and impairments:  Abnormal gait, Decreased activity tolerance, Decreased endurance, Decreased knowledge of use of DME, Decreased range of motion, Decreased strength, Impaired sensation, Pain, Difficulty walking, Decreased balance  Visit Diagnosis: Difficulty in walking, not elsewhere classified  Other  abnormalities of gait and mobility  Muscle weakness (generalized)  Stiffness of left ankle, not elsewhere classified     Problem List Patient Active Problem List   Diagnosis Date Noted   Hot flashes 04/25/2021   Amenorrhea 04/25/2021   Vitamin D deficiency 03/14/2021   Hyperlipidemia 03/14/2021   Closed fracture of right wrist 02/27/2021   Thrombocytosis    Closed fracture of left distal tibia 01/11/2021   Tibia/fibula fracture 01/03/2021   Closed left pilon fracture 01/03/2021   Closed fracture of right distal radius 01/03/2021   Closed disp fracture of base of second metacarpal bone of right hand 01/03/2021   Anemia 02/13/2015   Essential hypertension 02/13/2015   Psoriasis of scalp 06/22/2012   Preventative health care 06/20/2011   ANOSMIA 03/09/2010   Dysfunctional uterine bleeding 03/15/2008   CONTACT DERMATITIS&OTH ECZEMA DUE [facility]TH [facility] AGENT 03/15/2008    Jones Bales, PT, DPT 05/14/2021, 2:13 PM  [facility] [address] [place], [place], [address] Phone: [phone]   Fax:  [phone]  Name: Joanna Wright MRN: [id number] Date of Birth: [birth year]

## 2021-05-14 NOTE — Patient Instructions (Signed)
Access Code: [email or web address] URL: [email or web address] Date: 05/14/2021 Prepared by: Baldomero Lamy  Exercises Supine Ankle Pumps - 2 x daily - 7 x weekly - 1 sets - 10 reps Seated Ankle Alphabet - 2 x daily - 7 x weekly - 1 sets - 10 reps Long Sitting Calf Stretch with Strap - 2 x daily - 7 x weekly - 1 sets - 3 reps - 30 seconds hold Standing Single Leg Stance with Counter Support - 1 x daily - 5 x weekly - 1 sets - 4-5 reps - 10-15 seconds hold Standing Toe Taps - 1 x daily - 5 x weekly - 2 sets - 10 reps Staggered Sit-to-Stand - 1 x daily - 5 x weekly - 2 sets - 10 reps
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
## 2021-05-16 ENCOUNTER — Ambulatory Visit: Payer: PRIVATE HEALTH INSURANCE

## 2021-05-16 ENCOUNTER — Other Ambulatory Visit: Payer: Self-pay

## 2021-05-16 ENCOUNTER — Ambulatory Visit: Payer: PRIVATE HEALTH INSURANCE | Admitting: Occupational Therapy

## 2021-05-16 DIAGNOSIS — R2689 Other abnormalities of gait and mobility: Secondary | ICD-10-CM

## 2021-05-16 DIAGNOSIS — M6281 Muscle weakness (generalized): Secondary | ICD-10-CM

## 2021-05-16 DIAGNOSIS — M25641 Stiffness of right hand, not elsewhere classified: Secondary | ICD-10-CM

## 2021-05-16 DIAGNOSIS — R278 Other lack of coordination: Secondary | ICD-10-CM

## 2021-05-16 DIAGNOSIS — R262 Difficulty in walking, not elsewhere classified: Secondary | ICD-10-CM

## 2021-05-16 DIAGNOSIS — M25631 Stiffness of right wrist, not elsewhere classified: Secondary | ICD-10-CM

## 2021-05-16 DIAGNOSIS — M25672 Stiffness of left ankle, not elsewhere classified: Secondary | ICD-10-CM

## 2021-05-16 DIAGNOSIS — M79641 Pain in right hand: Secondary | ICD-10-CM

## 2021-05-16 NOTE — Therapy (Signed)
[facility] [address] [place], [place], [address] Phone: [phone]   Fax:  [phone]  Physical Therapy Treatment  Patient Details  Name: Joanna Wright MRN: [id number] Date of Birth: November 11, 1970 Referring Provider (PT): Referred by Lauraine Rinne. Followed by Leeroy Cha, MD. Surgeon Katha Hamming, MD)   Encounter Date: 05/16/2021   PT End of Session - 05/16/21 1236     Visit Number 13    Number of Visits 20    Date for PT Re-Evaluation 06/13/21    Authorization Type [employer]: insurance reset on 03/23/21 with 25 visits for OT, 25 vists for PT per pt report    Authorization - Visit Number 9    Authorization - Number of Visits 25    PT Start Time 1232    PT Stop Time 1313    PT Time Calculation (min) 41 min    Equipment Utilized During Treatment Gait belt    Activity Tolerance Patient tolerated treatment well    Behavior During Therapy WFL for tasks assessed/performed             Past Medical History:  Diagnosis Date   DUB (dysfunctional uterine bleeding)    Hx   Hypertension    MHA (microangiopathic hemolytic anemia) (HCC)    Hx   Night sweats    Hx   Psoriasis of scalp     Past Surgical History:  Procedure Laterality Date   ACHILLES TENDON REPAIR Right 2010   BREAST BIOPSY     BREAST EXCISIONAL BIOPSY     CERVICAL POLYPECTOMY N/A 05/19/2014   Procedure: CERVICAL POLYPECTOMY;  Surgeon: Sharene Butters, MD;  Location: [facility];  Service: Gynecology;  Laterality: N/A;   EXTERNAL FIXATION LEG Left 01/03/2021   Procedure: EXTERNAL FIXATION ANKLE LEFT and right distal radius fracture ORIF;  Surgeon: Shona Needles, MD;  Location: [facility];  Service: Orthopedics;  Laterality: Left;   EXTERNAL FIXATION REMOVAL Left 01/08/2021   Procedure: REMOVAL EXTERNAL FIXATION LEG;  Surgeon: Shona Needles, MD;  Location: [facility];  Service: Orthopedics;  Laterality: Left;   HARDWARE REMOVAL Right 03/02/2021    Procedure: HARDWARE REMOVAL RIGHT ARM AND MANIPULATION OF LEFT ANKLE;  Surgeon: Shona Needles, MD;  Location: [facility];  Service: Orthopedics;  Laterality: Right;   HYSTEROSCOPY N/A 05/19/2014   Procedure: HYSTEROSCOPY;  Surgeon: Sharene Butters, MD;  Location: [facility];  Service: Gynecology;  Laterality: N/A;   OPEN REDUCTION INTERNAL FIXATION (ORIF) TIBIA/FIBULA FRACTURE Left 01/08/2021   Procedure: OPEN REDUCTION INTERNAL FIXATION (ORIF) PILON FRACTURE;  Surgeon: Shona Needles, MD;  Location: [facility];  Service: Orthopedics;  Laterality: Left;   WISDOM TOOTH EXTRACTION      There were no vitals filed for this visit.   Subjective Assessment - 05/16/21 1236     Subjective Patient did not bring RW with her, is enjoying not have it. No pain currently.    Patient is accompained by: Family member   spouse   How long can you walk comfortably? 100 ft    Currently in Pain? No/denies                               [facility] Adult PT Treatment/Exercise - 05/16/21 0001       Transfers   Transfers Sit to Stand;Stand to Sit    Sit to Stand 6: Modified independent (Device/Increase time)    Stand to  Sit 6: Modified independent (Device/Increase time)      Ambulation/Gait   Ambulation/Gait Yes    Ambulation/Gait Assistance 5: Supervision    Ambulation/Gait Assistance Details contineud gait trianing without AD x 230 ft. mild discomforrt across anterior ankle with gait.    Ambulation Distance (Feet) 230 Feet    Assistive device None    Gait Pattern Decreased arm swing - right;Decreased arm swing - left;Decreased step length - left    Ambulation Surface Level;Indoor    Gait Comments compelted floor ladder working on reciprocal stepping x 4 laps no UE support. Min Guard throughout. Cues for step length.      Neuro Re-ed    Neuro Re-ed Details  standing without UE support, with colored pebbles in various directions (upside down V shape) in front of patient completed toe taps with RLE ot  promote stance on LLE, completed 2 sets x 6 times around. CGA required, most challenge noted with crossover.      Exercises   Exercises Other Exercises    Other Exercises  trialed standing gastroc stretch on first step dropping LLE heel back for improved stretch, completed 3 x 30 seconds. with cues for technique. with [employer] BAPS board completed A/P taps x 10 reps, lateral taps x 10 reps, followed by CW circles x 10 reps. With 4" block and LLE placed on block completd forward lunges to promote DF x 10 reps, with PT providing posterior glide at ankle.      Knee/Hip Exercises: Machines for Strengthening   Cybex Leg Press bil LE's- 120# 1 sets of 10 reps, PT placed 2" block under RLE to promote increased use of LLE. increased fatigue noted.                      PT Short Term Goals - 04/25/21 1326       PT SHORT TERM GOAL #1   Title Patient will be able to ambulate >/= 500 ft with indoors level surface SPC vs no AD with improved gait pattern noted    Baseline decreaed step length    Time 3    Status New    Target Date 05/16/21      PT SHORT TERM GOAL #2   Title Will assess SLS tolerance and LTG set    Baseline TBA    Time 3    Period Weeks    Status New               PT Long Term Goals - 04/25/21 1327       PT LONG TERM GOAL #1   Title Patient will be independent with Final HEP for ROM/Strengthening/Balance (All LTGs Due; 06/13/21)    Baseline no HEP established; reports independence with HEP, reports completing 3x/week, continue to benefit from progressive HEP    Time 7    Period Weeks    Status On-going    Target Date 06/13/21      PT LONG TERM GOAL #2   Title Patient will improve gait speed to >/= 1.5 ft/sec with LRAD to demonstrate imrpoved household/community mobility    Baseline 0.78 ft/sec; 1.3 ft/sec    Time 7    Period Weeks    Status On-going      PT LONG TERM GOAL #3   Title Patient will improve DF to >/= neutral (0 deg) and PF to >/= 25 degs     Baseline DF -5 deg, PF 14 deg; DF - 4 deg,  PF: 18    Time 7    Period Weeks    Status On-going      PT LONG TERM GOAL #4   Title Patient will improve 5x sit <> stand to <12 seconds to demonstrate improved balance and functional mobility    Baseline 16.25 secs; 13.01 secs    Time 7    Period Weeks    Status On-going      PT LONG TERM GOAL #5   Title Patient will be able to ambulate >/= 300 ft outdoors w/ LRAD and supervision    Baseline TBA    Time 7    Period Weeks    Status New      Additional Long Term Goals   Additional Long Term Goals Yes      PT LONG TERM GOAL #6   Title LTG to be set for SLS on LLE    Baseline TBA    Time 7    Period Weeks    Status New                   Plan - 05/16/21 1409     Clinical Impression Statement Patient ambulating without device. Continued activities promoting equal step length and exercises to promote DF ROM for improved heel strike with ambultaion. Intermittent rest breaks due to fatigue/discomfort in ankle. Will continue to progress toward all LTGs.    Personal Factors and Comorbidities Comorbidity 1    Comorbidities HTN    Examination-Activity Limitations Bathing;Stairs;Stand;Transfers;Locomotion Level;Dressing;Squat;Hygiene/Grooming    Examination-Participation Restrictions Driving;Community Activity;Occupation    Stability/Clinical Decision Making Stable/Uncomplicated    Rehab Potential Good    PT Frequency 2x / week    PT Duration 6 weeks   plus 1x/week for 1 week at start of POC   PT Treatment/Interventions ADLs/Self Care Home Management;Cryotherapy;Electrical Stimulation;Moist Heat;DME Instruction;Gait training;Stair training;Functional mobility training;Therapeutic activities;Therapeutic exercise;Balance training;Neuromuscular re-education;Cognitive remediation;Patient/family education;Manual techniques;Passive range of motion;Dry needling;Taping;Joint Manipulations    PT Next Visit Plan Manual Therapy as tolerated  to L Ankle. Continued strengthening/weight shift activities. ROM exercises for ankle. Gastroc stretch. Gait training with RW with emphasis on equal stance and step lenght, ? use of treadmil/gait trainer program.    Consulted and Agree with Plan of Care Patient;Family member/caregiver             Patient will benefit from skilled therapeutic intervention in order to improve the following deficits and impairments:  Abnormal gait, Decreased activity tolerance, Decreased endurance, Decreased knowledge of use of DME, Decreased range of motion, Decreased strength, Impaired sensation, Pain, Difficulty walking, Decreased balance  Visit Diagnosis: Muscle weakness (generalized)  Other abnormalities of gait and mobility  Stiffness of left ankle, not elsewhere classified  Difficulty in walking, not elsewhere classified     Problem List Patient Active Problem List   Diagnosis Date Noted   Hot flashes 04/25/2021   Amenorrhea 04/25/2021   Vitamin D deficiency 03/14/2021   Hyperlipidemia 03/14/2021   Closed fracture of right wrist 02/27/2021   Thrombocytosis    Closed fracture of left distal tibia 01/11/2021   Tibia/fibula fracture 01/03/2021   Closed left pilon fracture 01/03/2021   Closed fracture of right distal radius 01/03/2021   Closed disp fracture of base of second metacarpal bone of right hand 01/03/2021   Anemia 02/13/2015   Essential hypertension 02/13/2015   Psoriasis of scalp 06/22/2012   Preventative health care 06/20/2011   ANOSMIA 03/09/2010   Dysfunctional uterine bleeding 03/15/2008   CONTACT DERMATITIS&OTH ECZEMA DUE  OTH [facility] AGENT 03/15/2008    Jones Bales, PT, DPT 05/16/2021, 2:11 PM  [facility] [address] [place], [place], [address] Phone: [phone]   Fax:  [phone]  Name: Joanna Wright MRN: [id number] Date of Birth: 21-Jan-1971
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
## 2021-05-21 ENCOUNTER — Ambulatory Visit: Payer: PRIVATE HEALTH INSURANCE

## 2021-05-21 ENCOUNTER — Encounter: Payer: No Typology Code available for payment source | Admitting: Occupational Therapy

## 2021-05-22 ENCOUNTER — Other Ambulatory Visit: Payer: Self-pay

## 2021-05-22 ENCOUNTER — Ambulatory Visit: Payer: PRIVATE HEALTH INSURANCE | Admitting: Occupational Therapy

## 2021-05-22 DIAGNOSIS — R262 Difficulty in walking, not elsewhere classified: Secondary | ICD-10-CM | POA: Diagnosis not present

## 2021-05-22 DIAGNOSIS — R278 Other lack of coordination: Secondary | ICD-10-CM

## 2021-05-22 DIAGNOSIS — M25641 Stiffness of right hand, not elsewhere classified: Secondary | ICD-10-CM

## 2021-05-22 DIAGNOSIS — M25631 Stiffness of right wrist, not elsewhere classified: Secondary | ICD-10-CM

## 2021-05-22 DIAGNOSIS — M6281 Muscle weakness (generalized): Secondary | ICD-10-CM
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
## 2021-05-23 ENCOUNTER — Encounter: Payer: No Typology Code available for payment source | Admitting: Occupational Therapy

## 2021-05-23 ENCOUNTER — Ambulatory Visit: Payer: No Typology Code available for payment source

## 2021-05-25 ENCOUNTER — Ambulatory Visit (AMBULATORY_SURGERY_CENTER): Payer: No Typology Code available for payment source | Admitting: Gastroenterology

## 2021-05-25 ENCOUNTER — Other Ambulatory Visit: Payer: Self-pay

## 2021-05-25 ENCOUNTER — Encounter: Payer: Self-pay | Admitting: Gastroenterology

## 2021-05-25 VITALS — BP 124/64 | HR 80 | Temp 97.1°F | Resp 16 | Ht 63.0 in | Wt 186.0 lb

## 2021-05-25 DIAGNOSIS — Z1211 Encounter for screening for malignant neoplasm of colon: Secondary | ICD-10-CM

## 2021-05-25 DIAGNOSIS — K635 Polyp of colon: Secondary | ICD-10-CM | POA: Diagnosis not present

## 2021-05-25 DIAGNOSIS — D374 Neoplasm of uncertain behavior of colon: Secondary | ICD-10-CM

## 2021-05-25 MED ORDER — SODIUM CHLORIDE 0.9 % IV SOLN: 500.0000 mL | Freq: Once | INTRAVENOUS | Status: DC

## 2021-05-25 NOTE — Op Note (Signed)
[facility] Patient Name: Joanna Wright Procedure Date: 05/25/2021 11:04 AM MRN: [id number] Endoscopist: Thornton Park MD, MD Age: 50 Referring MD:  Date of Birth: [birth year] Gender: Female Account #: [id number] Procedure:                Colonoscopy Indications:              Screening for colorectal malignant neoplasm, This                            is the patient's first colonoscopy                           No known family history of colon cancer or polyps Medicines:                Monitored Anesthesia Care Procedure:                Pre-Anesthesia Assessment:                           - Prior to the procedure, a History and Physical                            was performed, and patient medications and                            allergies were reviewed. The patient's tolerance of                            previous anesthesia was also reviewed. The risks                            and benefits of the procedure and the sedation                            options and risks were discussed with the patient.                            All questions were answered, and informed consent                            was obtained. Prior Anticoagulants: The patient has                            taken no previous anticoagulant or antiplatelet                            agents. ASA Grade Assessment: II - A patient with                            mild systemic disease. After reviewing the risks                            and benefits, the patient was deemed in  satisfactory condition to undergo the procedure.                           After obtaining informed consent, the colonoscope                            was passed under direct vision. Throughout the                            procedure, the patient's blood pressure, pulse, and                            oxygen saturations were monitored continuously. The                            CF [id number] was  introduced through the anus                            and advanced to the 3 cm into the ileum. A second                            forward view of the right colon was performed. The                            colonoscopy was performed without difficulty. The                            patient tolerated the procedure well. The quality                            of the bowel preparation was good. The terminal                            ileum, ileocecal valve, appendiceal orifice, and                            rectum were photographed. Scope In: 11:19:34 AM Scope Out: 11:34:36 AM Scope Withdrawal Time: 0 hours 11 minutes 19 seconds  Total Procedure Duration: 0 hours 15 minutes 2 seconds  Findings:                 The perianal and digital rectal examinations were                            normal.                           A 2 mm polyp was found in the splenic flexure. The                            polyp was flat. The polyp was removed with a cold                            snare. Resection and retrieval were  complete.                            Estimated blood loss was minimal.                           The exam was otherwise without abnormality on                            direct and retroflexion views. Complications:            No immediate complications. Estimated blood loss:                            Minimal. Estimated Blood Loss:     Estimated blood loss was minimal. Impression:               - One 2 mm polyp at the splenic flexure, removed                            with a cold snare. Resected and retrieved.                           - The examination was otherwise normal on direct                            and retroflexion views. Recommendation:           - Patient has a contact number available for                            emergencies. The signs and symptoms of potential                            delayed complications were discussed with the                             patient. Return to normal activities tomorrow.                            Written discharge instructions were provided to the                            patient.                           - Resume previous diet.                           - Continue present medications.                           - Await pathology results.                           - Repeat colonoscopy date to be determined after  pending pathology results are reviewed for                            surveillance.                           - Emerging evidence supports eating a diet of                            fruits, vegetables, grains, calcium, and yogurt                            while reducing red meat and alcohol may reduce the                            risk of colon cancer.                           - Thank you for allowing me to be involved in your                            colon cancer prevention. Thornton Park MD, MD 05/25/2021 11:37:29 AM This report has been signed electronically.

## 2021-05-25 NOTE — Patient Instructions (Signed)
Thank you for allowing [place] to care for you today! Final result of polyp removed will be available in 7-10 days.  Recommendation will be made at that time for future colonoscopy. Resume previous diet and medications/supplements today. Return to normal daily activities tomorrow.  YOU HAD AN ENDOSCOPIC PROCEDURE TODAY AT [facility]:   Refer to the procedure report that was given to you for any specific questions about what was found during the examination.  If the procedure report does not answer your questions, please call your gastroenterologist to clarify.  If you requested that your care partner not be given the details of your procedure findings, then the procedure report has been included in a sealed envelope for you to review at your convenience later.  YOU SHOULD EXPECT: Some feelings of bloating in the abdomen. Passage of more gas than usual.  Walking can help get rid of the air that was put into your GI tract during the procedure and reduce the bloating. If you had a lower endoscopy (such as a colonoscopy or flexible sigmoidoscopy) you may notice spotting of blood in your stool or on the toilet paper. If you underwent a bowel prep for your procedure, you may not have a normal bowel movement for a few days.  Please Note:  You might notice some irritation and congestion in your nose or some drainage.  This is from the oxygen used during your procedure.  There is no need for concern and it should clear up in a day or so.  SYMPTOMS TO REPORT IMMEDIATELY:  Following lower endoscopy (colonoscopy or flexible sigmoidoscopy):  Excessive amounts of blood in the stool  Significant tenderness or worsening of abdominal pains  Swelling of the abdomen that is new, acute  Fever of 100F or higher   For urgent or emergent issues, a gastroenterologist can be reached at any hour by calling [phone]. Do not use MyChart messaging for urgent concerns.    DIET:  We do recommend a small  meal at first, but then you may proceed to your regular diet.  Drink plenty of fluids but you should avoid alcoholic beverages for 24 hours.  ACTIVITY:  You should plan to take it easy for the rest of today and you should NOT DRIVE or use heavy machinery until tomorrow (because of the sedation medicines used during the test).    FOLLOW UP: Our staff will call the number listed on your records 48-72 hours following your procedure to check on you and address any questions or concerns that you may have regarding the information given to you following your procedure. If we do not reach you, we will leave a message.  We will attempt to reach you two times.  During this call, we will ask if you have developed any symptoms of COVID 19. If you develop any symptoms (ie: fever, flu-like symptoms, shortness of breath, cough etc.) before then, please call [phone].  If you test positive for Covid 19 in the 2 weeks post procedure, please call and report this information to [place].    If any biopsies were taken you will be contacted by phone or by letter within the next 1-3 weeks.  Please call us at [phone] if you have not heard about the biopsies in 3 weeks.    SIGNATURES/CONFIDENTIALITY: You and/or your care partner have signed paperwork which will be entered into your electronic medical record.  These signatures attest to the fact that that the information above on  your After Visit Summary has been reviewed and is understood.  Full responsibility of the confidentiality of this discharge information lies with you and/or your care-partner.

## 2021-05-25 NOTE — Progress Notes (Signed)
Referring Provider: [name], NP Primary Care Physician:  [name], NP  Reason for Procedure:  Colon cancer screening   IMPRESSION:  Need for colon cancer screening  PLAN: Colonoscopy in the [facility] today   HPI: Joanna Wright is a 50 y.o. female presents for screening colonoscopy.  No prior colonoscopy or colon cancer screening.  No baseline GI symptoms.   No known family history of colon cancer or polyps. No family history of uterine/endometrial cancer, pancreatic cancer or gastric/stomach cancer.   Past Medical History:  Diagnosis Date   DUB (dysfunctional uterine bleeding)    Hx   Hypertension    MHA (microangiopathic hemolytic anemia) (HCC)    Hx   Night sweats    Hx   Psoriasis of scalp     Past Surgical History:  Procedure Laterality Date   ACHILLES TENDON REPAIR Right 2010   BREAST BIOPSY     BREAST EXCISIONAL BIOPSY     CERVICAL POLYPECTOMY N/A 05/19/2014   Procedure: CERVICAL POLYPECTOMY;  Surgeon: Sharene Butters, MD;  Location: [facility];  Service: Gynecology;  Laterality: N/A;   EXTERNAL FIXATION LEG Left 01/03/2021   Procedure: EXTERNAL FIXATION ANKLE LEFT and right distal radius fracture ORIF;  Surgeon: Shona Needles, MD;  Location: [facility];  Service: Orthopedics;  Laterality: Left;   EXTERNAL FIXATION REMOVAL Left 01/08/2021   Procedure: REMOVAL EXTERNAL FIXATION LEG;  Surgeon: Shona Needles, MD;  Location: [facility];  Service: Orthopedics;  Laterality: Left;   HARDWARE REMOVAL Right 03/02/2021   Procedure: HARDWARE REMOVAL RIGHT ARM AND MANIPULATION OF LEFT ANKLE;  Surgeon: Shona Needles, MD;  Location: [facility];  Service: Orthopedics;  Laterality: Right;   HYSTEROSCOPY N/A 05/19/2014   Procedure: HYSTEROSCOPY;  Surgeon: Sharene Butters, MD;  Location: [facility];  Service: Gynecology;  Laterality: N/A;   OPEN REDUCTION INTERNAL FIXATION (ORIF) TIBIA/FIBULA FRACTURE Left 01/08/2021   Procedure: OPEN REDUCTION INTERNAL FIXATION (ORIF) PILON  FRACTURE;  Surgeon: Shona Needles, MD;  Location: [facility];  Service: Orthopedics;  Laterality: Left;   WISDOM TOOTH EXTRACTION      Current Outpatient Medications  Medication Sig Dispense Refill   amLODipine (NORVASC) 10 MG tablet Take 1 tablet (10 mg total) by mouth daily. 90 tablet 0   gabapentin (NEURONTIN) 300 MG capsule Take 1 capsule (300 mg total) by mouth 3 (three) times daily. 90 capsule 3   norethindrone (MICRONOR) 0.35 MG tablet Take 1 tablet (0.35 mg total) by mouth daily. 28 tablet 11   acetaminophen (TYLENOL) 500 MG tablet Take 500 mg by mouth every 6 (six) hours as needed.     Current Facility-Administered Medications  Medication Dose Route Frequency Provider Last Rate Last Admin   0.9 %  sodium chloride infusion  500 mL Intravenous Once Thornton Park, MD        Allergies as of 05/25/2021   (No Known Allergies)    Family History  Problem Relation Age of Onset   Hypertension Mother        had "bleeding on the brain"   Glaucoma Father    Blindness Father    Peripheral vascular disease Father    Cancer Maternal Grandfather        unsure what type   Blindness Paternal Grandfather    Hypertension Other    Kidney disease Neg Hx    Diabetes Neg Hx    Colon cancer Neg Hx    Colon polyps Neg Hx    Esophageal cancer  Neg Hx    Rectal cancer Neg Hx    Stomach cancer Neg Hx      Physical Exam: General:   Alert,  well-nourished, pleasant and cooperative in NAD Head:  Normocephalic and atraumatic. Eyes:  Sclera clear, no icterus.   Conjunctiva pink. Mouth:  No deformity or lesions.   Neck:  Supple; no masses or thyromegaly. Lungs:  Clear throughout to auscultation.   No wheezes. Heart:  Regular rate and rhythm; no murmurs. Abdomen:  Soft, non-tender, nondistended, normal bowel sounds, no rebound or guarding.  Msk:  Symmetrical. No boney deformities LAD: No inguinal or umbilical LAD Extremities:  No clubbing or edema. Neurologic:  Alert and  oriented x4;   grossly nonfocal Skin:  No obvious rash or bruise. Psych:  Alert and cooperative. Normal mood and affect.     Joanna Fines L. Tarri Glenn, MD, MPH 05/25/2021, 11:09 AM

## 2021-05-25 NOTE — Progress Notes (Signed)
Called to room to assist during endoscopic procedure.  Patient ID and intended procedure confirmed with present staff. Received instructions for my participation in the procedure from the performing physician.  

## 2021-05-25 NOTE — Progress Notes (Signed)
Pt's states no medical or surgical changes since previsit or office visit. 

## 2021-05-29 ENCOUNTER — Ambulatory Visit: Payer: PRIVATE HEALTH INSURANCE | Admitting: Physical Therapy

## 2021-05-29 ENCOUNTER — Ambulatory Visit: Payer: PRIVATE HEALTH INSURANCE | Admitting: Occupational Therapy

## 2021-05-29 ENCOUNTER — Telehealth: Payer: Self-pay | Admitting: *Deleted

## 2021-05-29 NOTE — Telephone Encounter (Signed)
Have you developed a fever since your procedure? no  2.   Have you had an respiratory symptoms (SOB or cough) since your procedure? no  3.   Have you tested positive for COVID 19 since your procedure no  4.   Have you had any family members/close contacts diagnosed with the COVID 19 since your procedure?  no   If yes to any of these questions please route to Joylene John, RN and Joella Prince, RN  Follow up Call-  Call back number 05/25/2021  Post procedure Call Back phone  # [phone]  Permission to leave phone message Yes  Some recent data might be hidden     Patient questions:  Do you have a fever, pain , or abdominal swelling? No. Pain Score  0 *  Have you tolerated food without any problems? Yes.    Have you been able to return to your normal activities? Yes.    Do you have any questions about your discharge instructions: Diet   No. Medications  No. Follow up visit  No.  Do you have questions or concerns about your Care? No.  Actions: * If pain score is 4 or above: No action needed, pain <4.

## 2021-05-31 ENCOUNTER — Ambulatory Visit: Payer: PRIVATE HEALTH INSURANCE | Attending: Physician Assistant | Admitting: Physical Therapy

## 2021-05-31 ENCOUNTER — Encounter: Payer: Self-pay | Admitting: Physical Therapy

## 2021-05-31 ENCOUNTER — Other Ambulatory Visit: Payer: Self-pay

## 2021-05-31 DIAGNOSIS — Z0000 Encounter for general adult medical examination without abnormal findings: Secondary | ICD-10-CM | POA: Diagnosis not present

## 2021-05-31 DIAGNOSIS — R600 Localized edema: Secondary | ICD-10-CM | POA: Diagnosis not present

## 2021-05-31 DIAGNOSIS — M25672 Stiffness of left ankle, not elsewhere classified: Secondary | ICD-10-CM | POA: Insufficient documentation

## 2021-05-31 DIAGNOSIS — R278 Other lack of coordination: Secondary | ICD-10-CM | POA: Insufficient documentation

## 2021-05-31 DIAGNOSIS — M79641 Pain in right hand: Secondary | ICD-10-CM | POA: Diagnosis not present

## 2021-05-31 DIAGNOSIS — Z1231 Encounter for screening mammogram for malignant neoplasm of breast: Secondary | ICD-10-CM | POA: Diagnosis not present

## 2021-05-31 DIAGNOSIS — M25631 Stiffness of right wrist, not elsewhere classified: Secondary | ICD-10-CM | POA: Diagnosis not present

## 2021-05-31 DIAGNOSIS — M6281 Muscle weakness (generalized): Secondary | ICD-10-CM | POA: Insufficient documentation

## 2021-05-31 DIAGNOSIS — R2689 Other abnormalities of gait and mobility: Secondary | ICD-10-CM | POA: Diagnosis present

## 2021-05-31 DIAGNOSIS — R262 Difficulty in walking, not elsewhere classified: Secondary | ICD-10-CM | POA: Insufficient documentation

## 2021-05-31 DIAGNOSIS — M25641 Stiffness of right hand, not elsewhere classified: Secondary | ICD-10-CM | POA: Diagnosis not present

## 2021-05-31 NOTE — Therapy (Addendum)
[facility] [address] [place], [place], [address] Phone: [phone]   Fax:  [phone]  Physical Therapy Treatment  Patient Details  Name: Joanna Wright MRN: [id number] Date of Birth: [birth year] Referring Provider (PT): Referred by Lauraine Rinne. Followed by Leeroy Cha, MD. Surgeon Katha Hamming, MD)   Encounter Date: 05/31/2021   PT End of Session - 05/31/21 1322     Visit Number 14    Number of Visits 20    Date for PT Re-Evaluation 06/13/21    Authorization Type [employer]: insurance reset on 03/23/21 with 25 visits for OT, 25 vists for PT per pt report    Authorization - Visit Number 10    Authorization - Number of Visits 25    PT Start Time 5520    PT Stop Time 1356    PT Time Calculation (min) 38 min    Equipment Utilized During Treatment Gait belt    Activity Tolerance Patient tolerated treatment well    Behavior During Therapy WFL for tasks assessed/performed             Past Medical History:  Diagnosis Date   DUB (dysfunctional uterine bleeding)    Hx   Hypertension    MHA (microangiopathic hemolytic anemia) (HCC)    Hx   Night sweats    Hx   Psoriasis of scalp     Past Surgical History:  Procedure Laterality Date   ACHILLES TENDON REPAIR Right 2010   BREAST BIOPSY     BREAST EXCISIONAL BIOPSY     CERVICAL POLYPECTOMY N/A 05/19/2014   Procedure: CERVICAL POLYPECTOMY;  Surgeon: Sharene Butters, MD;  Location: [facility];  Service: Gynecology;  Laterality: N/A;   EXTERNAL FIXATION LEG Left 01/03/2021   Procedure: EXTERNAL FIXATION ANKLE LEFT and right distal radius fracture ORIF;  Surgeon: Shona Needles, MD;  Location: [facility];  Service: Orthopedics;  Laterality: Left;   EXTERNAL FIXATION REMOVAL Left 01/08/2021   Procedure: REMOVAL EXTERNAL FIXATION LEG;  Surgeon: Shona Needles, MD;  Location: [facility];  Service: Orthopedics;  Laterality: Left;   HARDWARE REMOVAL Right 03/02/2021    Procedure: HARDWARE REMOVAL RIGHT ARM AND MANIPULATION OF LEFT ANKLE;  Surgeon: Shona Needles, MD;  Location: [facility];  Service: Orthopedics;  Laterality: Right;   HYSTEROSCOPY N/A 05/19/2014   Procedure: HYSTEROSCOPY;  Surgeon: Sharene Butters, MD;  Location: [facility];  Service: Gynecology;  Laterality: N/A;   OPEN REDUCTION INTERNAL FIXATION (ORIF) TIBIA/FIBULA FRACTURE Left 01/08/2021   Procedure: OPEN REDUCTION INTERNAL FIXATION (ORIF) PILON FRACTURE;  Surgeon: Shona Needles, MD;  Location: [facility];  Service: Orthopedics;  Laterality: Left;   WISDOM TOOTH EXTRACTION      There were no vitals filed for this visit.   Subjective Assessment - 05/31/21 1321     Subjective No new complaints. No falls. Some pain at medial aspect of ankle.    Pertinent History HTN    Limitations Standing;House hold activities;Walking    How long can you walk comfortably? 100 ft    Currently in Pain? Yes    Pain Score 6     Pain Location Ankle    Pain Orientation Left    Pain Descriptors / Indicators Aching;Sore    Pain Type Chronic pain;Surgical pain    Pain Onset More than a month ago    Pain Frequency Intermittent    Aggravating Factors  increased activity    Pain Relieving Factors rest, ice  [facility] Adult PT Treatment/Exercise - 05/31/21 1323       Transfers   Transfers Sit to Stand;Stand to Sit    Sit to Stand 6: Modified independent (Device/Increase time)    Stand to Sit 6: Modified independent (Device/Increase time)      Ambulation/Gait   Ambulation/Gait Yes    Ambulation/Gait Assistance 5: Supervision    Ambulation/Gait Assistance Details continued to provide cues for increased left stance time and increased right step length with gait.    Ambulation Distance (Feet) 575 Feet   x1, plus around clinic with session   Assistive device None    Gait Pattern Decreased arm swing - right;Decreased arm swing - left;Decreased step length - left    Ambulation Surface Level;Indoor       Exercises   Exercises Other Exercises    Other Exercises  sitting with L foot on BAPS board completed A/P taps x 10 reps, lateral taps x 10 reps, then circles both ways for 10 reps each. overpressure at ankle to assist with increased range of motion and stabilize foot on board.                 Balance Exercises - 05/31/21 1329       Balance Exercises: Standing   SLS Solid surface;Other reps (comment);Limitations    SLS Limitations starting with bil UE support, progressed to single UE support then no UE support. without UE support only able to hold 5-6 seconds.    SLS with Vectors Foam/compliant surface;Upper extremity assist 1;Other reps (comment);Limitations    SLS with Vectors Limitations left stance on airex with 5 color dots in semi circle on floor on right side- right foot taps to each<>back to airex for 7-8 laps around front>back with emphasis on left knee bending in stance to reach circle. min guard to min assist for safety.    Rockerboard Anterior/posterior;Lateral;EO;EC;30 seconds;Other reps (comment);Limitations    Rockerboard Limitations performed both ways on board with no UE support: rocking the board with emphasis on movement at ankle with tall posture with EO, progressing to EC with min guard to min assist for balance; then holding the board steady for 30 sec's x 3 reps with EC, min guard to min assist.                  PT Short Term Goals - 05/31/21 1323       PT SHORT TERM GOAL #1   Title Patient will be able to ambulate >/= 500 ft with indoors level surface SPC vs no AD with improved gait pattern noted    Baseline 05/31/21: met in session with no AD and mild antalgic gait pattern remaining    Time --    Status Achieved      PT SHORT TERM GOAL #2   Title Will assess SLS tolerance and LTG set    Baseline 05/31/21: starting with bil UE support, progressed to single UE support then no UE support. without UE support only able to hold 5-6 seconds.  Primary  PT to set goals    Time --    Period --    Status Achieved               PT Long Term Goals - 05/31/21 1455       PT LONG TERM GOAL #1   Title Patient will be independent with Final HEP for ROM/Strengthening/Balance (All LTGs Due; 06/13/21)    Baseline no HEP established; reports independence with HEP, reports  completing 3x/week, continue to benefit from progressive HEP    Time 7    Period Weeks    Status On-going      PT LONG TERM GOAL #2   Title Patient will improve gait speed to >/= 1.5 ft/sec with LRAD to demonstrate imrpoved household/community mobility    Baseline 0.78 ft/sec; 1.3 ft/sec    Time 7    Period Weeks    Status On-going      PT LONG TERM GOAL #3   Title Patient will improve DF to >/= neutral (0 deg) and PF to >/= 25 degs    Baseline DF -5 deg, PF 14 deg; DF - 4 deg, PF: 18    Time 7    Period Weeks    Status On-going      PT LONG TERM GOAL #4   Title Patient will improve 5x sit <> stand to <12 seconds to demonstrate improved balance and functional mobility    Baseline 16.25 secs; 13.01 secs    Time 7    Period Weeks    Status On-going      PT LONG TERM GOAL #5   Title Patient will be able to ambulate >/= 300 ft outdoors w/ LRAD and supervision    Baseline TBA    Time 7    Period Weeks    Status New      PT LONG TERM GOAL #6   Title Patient will be able to hold SLS for >/= 10seconds on LLE to demo improved stability/balance    Baseline 5-6 seconds    Time 7    Period Weeks    Status Revised                   Plan - 05/31/21 1323     Clinical Impression Statement Today's skilled session initially focused on progress toward STGs with goals met. Primary PT to set SLS goal as indicated. Remainder of session continued to focus on ankle range of motion, LE strengthening and balance training with no issues noted or reported, pt reporting decreased pain to 4/10 at end of session. . The pt is making progress and should benefit from  continued PT to progress toward unmet goals.    Personal Factors and Comorbidities Comorbidity 1    Comorbidities HTN    Examination-Activity Limitations Bathing;Stairs;Stand;Transfers;Locomotion Level;Dressing;Squat;Hygiene/Grooming    Examination-Participation Restrictions Driving;Community Activity;Occupation    Stability/Clinical Decision Making Stable/Uncomplicated    Rehab Potential Good    PT Frequency 2x / week    PT Duration 6 weeks   plus 1x/week for 1 week at start of POC   PT Treatment/Interventions ADLs/Self Care Home Management;Cryotherapy;Electrical Stimulation;Moist Heat;DME Instruction;Gait training;Stair training;Functional mobility training;Therapeutic activities;Therapeutic exercise;Balance training;Neuromuscular re-education;Cognitive remediation;Patient/family education;Manual techniques;Passive range of motion;Dry needling;Taping;Joint Manipulations    PT Next Visit Plan Manual Therapy as tolerated to L Ankle. Continued strengthening/weight shift activities. ROM exercises for ankle. Gastroc stretch. Gait training with RW with emphasis on equal stance and step lenght, ? use of treadmil/gait trainer program.    Consulted and Agree with Plan of Care Patient;Family member/caregiver             Patient will benefit from skilled therapeutic intervention in order to improve the following deficits and impairments:  Abnormal gait, Decreased activity tolerance, Decreased endurance, Decreased knowledge of use of DME, Decreased range of motion, Decreased strength, Impaired sensation, Pain, Difficulty walking, Decreased balance  Visit Diagnosis: Muscle weakness (generalized)  Other abnormalities of gait and mobility  Stiffness of left ankle, not elsewhere classified  Difficulty in walking, not elsewhere classified     Problem List Patient Active Problem List   Diagnosis Date Noted   Hot flashes 04/25/2021   Amenorrhea 04/25/2021   Vitamin D deficiency 03/14/2021    Hyperlipidemia 03/14/2021   Closed fracture of right wrist 02/27/2021   Thrombocytosis    Closed fracture of left distal tibia 01/11/2021   Tibia/fibula fracture 01/03/2021   Closed left pilon fracture 01/03/2021   Closed fracture of right distal radius 01/03/2021   Closed disp fracture of base of second metacarpal bone of right hand 01/03/2021   Anemia 02/13/2015   Essential hypertension 02/13/2015   Psoriasis of scalp 06/22/2012   Preventative health care 06/20/2011   ANOSMIA 03/09/2010   Dysfunctional uterine bleeding 03/15/2008   CONTACT DERMATITIS&OTH ECZEMA DUE OTH SPEC AGENT 03/15/2008   Addendum: Charlotte Sanes, PT, DPT   Willow Ora, PTA, [facility] [address] [place], [place] [address] [phone] 05/31/21, 2:56 PM   Name: MADALEINE SIMMON MRN: [id number] Date of Birth: [birth year]

## 2021-06-03 ENCOUNTER — Encounter: Payer: Self-pay | Admitting: Gastroenterology

## 2021-06-04 ENCOUNTER — Ambulatory Visit: Payer: PRIVATE HEALTH INSURANCE

## 2021-06-04 ENCOUNTER — Ambulatory Visit: Payer: PRIVATE HEALTH INSURANCE | Admitting: Occupational Therapy

## 2021-06-04 ENCOUNTER — Other Ambulatory Visit: Payer: Self-pay

## 2021-06-04 DIAGNOSIS — M25672 Stiffness of left ankle, not elsewhere classified: Secondary | ICD-10-CM

## 2021-06-04 DIAGNOSIS — Z1231 Encounter for screening mammogram for malignant neoplasm of breast: Secondary | ICD-10-CM | POA: Diagnosis not present

## 2021-06-04 DIAGNOSIS — M79641 Pain in right hand: Secondary | ICD-10-CM

## 2021-06-04 DIAGNOSIS — M6281 Muscle weakness (generalized): Secondary | ICD-10-CM | POA: Diagnosis not present

## 2021-06-04 DIAGNOSIS — M25641 Stiffness of right hand, not elsewhere classified: Secondary | ICD-10-CM

## 2021-06-04 DIAGNOSIS — R278 Other lack of coordination: Secondary | ICD-10-CM

## 2021-06-04 DIAGNOSIS — R2689 Other abnormalities of gait and mobility: Secondary | ICD-10-CM

## 2021-06-04 DIAGNOSIS — R262 Difficulty in walking, not elsewhere classified: Secondary | ICD-10-CM

## 2021-06-04 NOTE — Patient Instructions (Signed)
  1. Grip Strengthening (Resistive Putty)   Squeeze putty using thumb and all fingers. Repeat _20___ times. Do __2__ sessions per day.   2. Roll putty into tube on table and pinch between first two fingers and thumb x 10 reps. Do 2 sessions per day  3. Lateral Pinch Strengthening (Resistive Putty)    Squeeze between thumb and side of index finger in turn. Repeat __10__ times. Do __2__ sessions per day.  4. MP Flexion (Resistive Putty)    Bending only at large knuckles, press putty down against thumb. Keep fingertips straight, then pull apart like taffy. Repeat __10__ times. Do _2___ sessions per day.

## 2021-06-04 NOTE — Therapy (Signed)
[facility] [address] [place], [place], [address] Phone: [phone]   Fax:  [phone]  Physical Therapy Treatment  Patient Details  Name: Joanna Wright MRN: [id number] Date of Birth: August 10, 1971 Referring Provider (PT): Referred by Lauraine Rinne. Followed by Leeroy Cha, MD. Surgeon Katha Hamming, MD)   Encounter Date: 06/04/2021   PT End of Session - 06/04/21 1229     Visit Number 15    Number of Visits 20    Date for PT Re-Evaluation 06/13/21    Authorization Type [employer]: insurance reset on 03/23/21 with 25 visits for OT, 25 vists for PT per pt report    Authorization - Visit Number 11    Authorization - Number of Visits 25    PT Start Time 3810    PT Stop Time 1313    PT Time Calculation (min) 43 min    Equipment Utilized During Treatment Gait belt    Activity Tolerance Patient tolerated treatment well    Behavior During Therapy WFL for tasks assessed/performed             Past Medical History:  Diagnosis Date   DUB (dysfunctional uterine bleeding)    Hx   Hypertension    MHA (microangiopathic hemolytic anemia) (HCC)    Hx   Night sweats    Hx   Psoriasis of scalp     Past Surgical History:  Procedure Laterality Date   ACHILLES TENDON REPAIR Right 2010   BREAST BIOPSY     BREAST EXCISIONAL BIOPSY     CERVICAL POLYPECTOMY N/A 05/19/2014   Procedure: CERVICAL POLYPECTOMY;  Surgeon: Sharene Butters, MD;  Location: [facility];  Service: Gynecology;  Laterality: N/A;   EXTERNAL FIXATION LEG Left 01/03/2021   Procedure: EXTERNAL FIXATION ANKLE LEFT and right distal radius fracture ORIF;  Surgeon: Shona Needles, MD;  Location: [facility];  Service: Orthopedics;  Laterality: Left;   EXTERNAL FIXATION REMOVAL Left 01/08/2021   Procedure: REMOVAL EXTERNAL FIXATION LEG;  Surgeon: Shona Needles, MD;  Location: [facility];  Service: Orthopedics;  Laterality: Left;   HARDWARE REMOVAL Right 03/02/2021    Procedure: HARDWARE REMOVAL RIGHT ARM AND MANIPULATION OF LEFT ANKLE;  Surgeon: Shona Needles, MD;  Location: [facility];  Service: Orthopedics;  Laterality: Right;   HYSTEROSCOPY N/A 05/19/2014   Procedure: HYSTEROSCOPY;  Surgeon: Sharene Butters, MD;  Location: [facility];  Service: Gynecology;  Laterality: N/A;   OPEN REDUCTION INTERNAL FIXATION (ORIF) TIBIA/FIBULA FRACTURE Left 01/08/2021   Procedure: OPEN REDUCTION INTERNAL FIXATION (ORIF) PILON FRACTURE;  Surgeon: Shona Needles, MD;  Location: [facility];  Service: Orthopedics;  Laterality: Left;   WISDOM TOOTH EXTRACTION      There were no vitals filed for this visit.   Subjective Assessment - 06/04/21 1230     Subjective Patient reports still have some intermittent pain on medial aspect, but comes/goes. No other new changes.    Pertinent History HTN    Limitations Standing;House hold activities;Walking    How long can you walk comfortably? 100 ft    Currently in Pain? No/denies    Pain Onset More than a month ago               [facility] Adult PT Treatment/Exercise - 06/04/21 0001       Transfers   Number of Reps 1 set;Other reps (comment)    Comments completed single leg sit <> stand with onlr RLE contact in ground, PT blocking  use of RLE and CGA. completed x 5 reps from mat. no UE support/      Ambulation/Gait   Ambulation/Gait Yes    Ambulation/Gait Assistance 5: Supervision    Ambulation/Gait Assistance Details throughout therapy gym with actvities, intermittent cue for step length.    Assistive device None    Gait Pattern Decreased arm swing - right;Decreased arm swing - left;Decreased step length - left    Ambulation Surface Level;Indoor      High Level Balance   High Level Balance Activities Negotiating over obstacles    High Level Balance Comments At countertop: completed working negotiating over [employer] forward working on reciprocal stepping x 3 laps with single UE support, then x 3 laps without. CGA. Then completed  lateral sidestepping over hurdles x 3 laps without UE support.      Neuro Re-ed    Neuro Re-ed Details  standing with single UE support, with colored targets in various directions in front/lateral to patient PT calling out color and completing toe taps to associated, completed with increased speed and no touch down to promote improved SLS time.intermittent CGA required. Completed 2 x 10 reps.      Exercises   Exercises Knee/Hip;Other Exercises    Other Exercises  Completed steps up on 1st step with LLE leading x 15 reps, single UE support required from handrail. cues for looking forward. Completed gastroc stretch, 3 x 30 seconds with LLE hanging off the step progressing into range to patient's tolerance.      Knee/Hip Exercises: Aerobic   Elliptical Completed elliptical on Level 1.0  x 3 minutes forward, then trialed going backwards x 3 minutes. PT providing CGA to get onto elliptical. Patient tolerating well, increased challenge backwards > forwards. Seated rest break required after completion.              Balance Exercises - 06/04/21 0001       Balance Exercises: Standing   SLS with Vectors Solid surface;Limitations    SLS with Vectors Limitations standing on firm surface completed toe taps to 1st step followed by toe tap to 2nd with RLE to promote LLE stance, completed x 15 reps.                  PT Short Term Goals - 05/31/21 1323       PT SHORT TERM GOAL #1   Title Patient will be able to ambulate >/= 500 ft with indoors level surface SPC vs no AD with improved gait pattern noted    Baseline 05/31/21: met in session with no AD and mild antalgic gait pattern remaining    Time --    Status Achieved      PT SHORT TERM GOAL #2   Title Will assess SLS tolerance and LTG set    Baseline 05/31/21: starting with bil UE support, progressed to single UE support then no UE support. without UE support only able to hold 5-6 seconds.  Primary PT to set goals    Time --    Period --     Status Achieved               PT Long Term Goals - 05/31/21 1455       PT LONG TERM GOAL #1   Title Patient will be independent with Final HEP for ROM/Strengthening/Balance (All LTGs Due; 06/13/21)    Baseline no HEP established; reports independence with HEP, reports completing 3x/week, continue to benefit from progressive HEP  Time 7    Period Weeks    Status On-going      PT LONG TERM GOAL #2   Title Patient will improve gait speed to >/= 1.5 ft/sec with LRAD to demonstrate imrpoved household/community mobility    Baseline 0.78 ft/sec; 1.3 ft/sec    Time 7    Period Weeks    Status On-going      PT LONG TERM GOAL #3   Title Patient will improve DF to >/= neutral (0 deg) and PF to >/= 25 degs    Baseline DF -5 deg, PF 14 deg; DF - 4 deg, PF: 18    Time 7    Period Weeks    Status On-going      PT LONG TERM GOAL #4   Title Patient will improve 5x sit <> stand to <12 seconds to demonstrate improved balance and functional mobility    Baseline 16.25 secs; 13.01 secs    Time 7    Period Weeks    Status On-going      PT LONG TERM GOAL #5   Title Patient will be able to ambulate >/= 300 ft outdoors w/ LRAD and supervision    Baseline TBA    Time 7    Period Weeks    Status New      PT LONG TERM GOAL #6   Title Patient will be able to hold SLS for >/= 10seconds on LLE to demo improved stability/balance    Baseline 5-6 seconds    Time 7    Period Weeks    Status Revised                   Plan - 06/04/21 1230     Clinical Impression Statement Trialed elliptical today with patient tolerating well, close supervision throughout. Continued activities focused on SLS activities and LLE strengthening slowly reducing UE support. Wil continue to progress toward all LTGs.    Personal Factors and Comorbidities Comorbidity 1    Comorbidities HTN    Examination-Activity Limitations Bathing;Stairs;Stand;Transfers;Locomotion Level;Dressing;Squat;Hygiene/Grooming     Examination-Participation Restrictions Driving;Community Activity;Occupation    Stability/Clinical Decision Making Stable/Uncomplicated    Rehab Potential Good    PT Frequency 2x / week    PT Duration 6 weeks   plus 1x/week for 1 week at start of POC   PT Treatment/Interventions ADLs/Self Care Home Management;Cryotherapy;Electrical Stimulation;Moist Heat;DME Instruction;Gait training;Stair training;Functional mobility training;Therapeutic activities;Therapeutic exercise;Balance training;Neuromuscular re-education;Cognitive remediation;Patient/family education;Manual techniques;Passive range of motion;Dry needling;Taping;Joint Manipulations    PT Next Visit Plan Continued strengthening/weight shift activities. ROM exercises for ankle. Gastroc stretch. Gait training witout AD emphasis on equal stance and step lenght, ? use of treadmil/gait trainer program.    Consulted and Agree with Plan of Care Patient;Family member/caregiver             Patient will benefit from skilled therapeutic intervention in order to improve the following deficits and impairments:  Abnormal gait, Decreased activity tolerance, Decreased endurance, Decreased knowledge of use of DME, Decreased range of motion, Decreased strength, Impaired sensation, Pain, Difficulty walking, Decreased balance  Visit Diagnosis: Muscle weakness (generalized)  Other abnormalities of gait and mobility  Stiffness of left ankle, not elsewhere classified  Difficulty in walking, not elsewhere classified     Problem List Patient Active Problem List   Diagnosis Date Noted   Hot flashes 04/25/2021   Amenorrhea 04/25/2021   Vitamin D deficiency 03/14/2021   Hyperlipidemia 03/14/2021   Closed fracture of right wrist 02/27/2021  Thrombocytosis    Closed fracture of left distal tibia 01/11/2021   Tibia/fibula fracture 01/03/2021   Closed left pilon fracture 01/03/2021   Closed fracture of right distal radius 01/03/2021   Closed  disp fracture of base of second metacarpal bone of right hand 01/03/2021   Anemia 02/13/2015   Essential hypertension 02/13/2015   Psoriasis of scalp 06/22/2012   Preventative health care 06/20/2011   ANOSMIA 03/09/2010   Dysfunctional uterine bleeding 03/15/2008   CONTACT DERMATITIS&OTH ECZEMA DUE OTH [facility] AGENT 03/15/2008    Jones Bales, PT, DPT 06/04/2021, 2:10 PM  [facility] [address] [place], [place], [address] Phone: [phone]   Fax:  [phone]  Name: Joanna Wright MRN: [id number] Date of Birth: [birth year]
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
## 2021-06-05 ENCOUNTER — Ambulatory Visit (HOSPITAL_BASED_OUTPATIENT_CLINIC_OR_DEPARTMENT_OTHER): Payer: No Typology Code available for payment source

## 2021-06-06 ENCOUNTER — Other Ambulatory Visit: Payer: Self-pay

## 2021-06-06 ENCOUNTER — Ambulatory Visit: Payer: PRIVATE HEALTH INSURANCE

## 2021-06-06 ENCOUNTER — Ambulatory Visit: Payer: PRIVATE HEALTH INSURANCE | Admitting: Occupational Therapy

## 2021-06-06 DIAGNOSIS — R262 Difficulty in walking, not elsewhere classified: Secondary | ICD-10-CM

## 2021-06-06 DIAGNOSIS — M25672 Stiffness of left ankle, not elsewhere classified: Secondary | ICD-10-CM

## 2021-06-06 DIAGNOSIS — M6281 Muscle weakness (generalized): Secondary | ICD-10-CM

## 2021-06-06 DIAGNOSIS — M25631 Stiffness of right wrist, not elsewhere classified: Secondary | ICD-10-CM

## 2021-06-06 DIAGNOSIS — Z1231 Encounter for screening mammogram for malignant neoplasm of breast: Secondary | ICD-10-CM | POA: Diagnosis not present

## 2021-06-06 DIAGNOSIS — R2689 Other abnormalities of gait and mobility: Secondary | ICD-10-CM

## 2021-06-06 DIAGNOSIS — R278 Other lack of coordination: Secondary | ICD-10-CM

## 2021-06-06 DIAGNOSIS — M25641 Stiffness of right hand, not elsewhere classified: Secondary | ICD-10-CM
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
## 2021-06-11 ENCOUNTER — Other Ambulatory Visit: Payer: Self-pay

## 2021-06-11 ENCOUNTER — Ambulatory Visit: Payer: PRIVATE HEALTH INSURANCE | Admitting: Occupational Therapy

## 2021-06-11 DIAGNOSIS — M25631 Stiffness of right wrist, not elsewhere classified: Secondary | ICD-10-CM

## 2021-06-11 DIAGNOSIS — R278 Other lack of coordination: Secondary | ICD-10-CM

## 2021-06-11 DIAGNOSIS — M6281 Muscle weakness (generalized): Secondary | ICD-10-CM | POA: Diagnosis not present

## 2021-06-11 DIAGNOSIS — Z1231 Encounter for screening mammogram for malignant neoplasm of breast: Secondary | ICD-10-CM | POA: Diagnosis not present

## 2021-06-11 DIAGNOSIS — M25641 Stiffness of right hand, not elsewhere classified: Secondary | ICD-10-CM
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
## 2021-06-12 ENCOUNTER — Other Ambulatory Visit: Payer: Self-pay | Admitting: Family

## 2021-06-13 ENCOUNTER — Ambulatory Visit: Payer: PRIVATE HEALTH INSURANCE | Admitting: Occupational Therapy

## 2021-06-13 ENCOUNTER — Other Ambulatory Visit: Payer: Self-pay

## 2021-06-13 DIAGNOSIS — M6281 Muscle weakness (generalized): Secondary | ICD-10-CM

## 2021-06-13 DIAGNOSIS — R278 Other lack of coordination: Secondary | ICD-10-CM

## 2021-06-13 DIAGNOSIS — Z1231 Encounter for screening mammogram for malignant neoplasm of breast: Secondary | ICD-10-CM | POA: Diagnosis not present

## 2021-06-13 DIAGNOSIS — M25631 Stiffness of right wrist, not elsewhere classified: Secondary | ICD-10-CM

## 2021-06-13 DIAGNOSIS — M25641 Stiffness of right hand, not elsewhere classified: Secondary | ICD-10-CM
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
## 2021-06-18 ENCOUNTER — Other Ambulatory Visit: Payer: Self-pay

## 2021-06-18 ENCOUNTER — Ambulatory Visit: Payer: PRIVATE HEALTH INSURANCE | Admitting: Occupational Therapy

## 2021-06-18 ENCOUNTER — Encounter (HOSPITAL_BASED_OUTPATIENT_CLINIC_OR_DEPARTMENT_OTHER): Payer: Self-pay

## 2021-06-18 ENCOUNTER — Ambulatory Visit (HOSPITAL_BASED_OUTPATIENT_CLINIC_OR_DEPARTMENT_OTHER)
Admission: RE | Admit: 2021-06-18 | Discharge: 2021-06-18 | Disposition: A | Discharge status: Home / Self Care | Payer: PRIVATE HEALTH INSURANCE | Source: Ambulatory Visit | Attending: Family | Admitting: Family

## 2021-06-18 DIAGNOSIS — Z0000 Encounter for general adult medical examination without abnormal findings: Secondary | ICD-10-CM | POA: Insufficient documentation

## 2021-06-18 DIAGNOSIS — M6281 Muscle weakness (generalized): Secondary | ICD-10-CM

## 2021-06-18 DIAGNOSIS — Z1231 Encounter for screening mammogram for malignant neoplasm of breast: Secondary | ICD-10-CM | POA: Insufficient documentation

## 2021-06-18 DIAGNOSIS — M25641 Stiffness of right hand, not elsewhere classified: Secondary | ICD-10-CM

## 2021-06-18 DIAGNOSIS — R278 Other lack of coordination: Secondary | ICD-10-CM

## 2021-06-18 DIAGNOSIS — M25631 Stiffness of right wrist, not elsewhere classified: Secondary | ICD-10-CM
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
## 2021-06-20 ENCOUNTER — Other Ambulatory Visit: Payer: Self-pay

## 2021-06-20 ENCOUNTER — Ambulatory Visit: Payer: PRIVATE HEALTH INSURANCE | Admitting: Occupational Therapy

## 2021-06-20 DIAGNOSIS — R600 Localized edema: Secondary | ICD-10-CM

## 2021-06-20 DIAGNOSIS — M6281 Muscle weakness (generalized): Secondary | ICD-10-CM

## 2021-06-20 DIAGNOSIS — M25641 Stiffness of right hand, not elsewhere classified: Secondary | ICD-10-CM

## 2021-06-20 DIAGNOSIS — M25631 Stiffness of right wrist, not elsewhere classified: Secondary | ICD-10-CM

## 2021-06-20 DIAGNOSIS — Z1231 Encounter for screening mammogram for malignant neoplasm of breast: Secondary | ICD-10-CM | POA: Diagnosis not present

## 2021-06-20 DIAGNOSIS — R278 Other lack of coordination: Secondary | ICD-10-CM
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
## 2021-06-26 ENCOUNTER — Other Ambulatory Visit: Payer: Self-pay

## 2021-06-26 ENCOUNTER — Ambulatory Visit: Payer: No Typology Code available for payment source | Attending: Physician Assistant | Admitting: Occupational Therapy

## 2021-06-26 ENCOUNTER — Ambulatory Visit: Payer: No Typology Code available for payment source

## 2021-06-26 DIAGNOSIS — R278 Other lack of coordination: Secondary | ICD-10-CM

## 2021-06-26 DIAGNOSIS — M25631 Stiffness of right wrist, not elsewhere classified: Secondary | ICD-10-CM | POA: Insufficient documentation

## 2021-06-26 DIAGNOSIS — R262 Difficulty in walking, not elsewhere classified: Secondary | ICD-10-CM | POA: Diagnosis present

## 2021-06-26 DIAGNOSIS — M6281 Muscle weakness (generalized): Secondary | ICD-10-CM | POA: Diagnosis present

## 2021-06-26 DIAGNOSIS — R2689 Other abnormalities of gait and mobility: Secondary | ICD-10-CM | POA: Insufficient documentation

## 2021-06-26 DIAGNOSIS — M25641 Stiffness of right hand, not elsewhere classified: Secondary | ICD-10-CM | POA: Diagnosis not present

## 2021-06-26 DIAGNOSIS — M25672 Stiffness of left ankle, not elsewhere classified: Secondary | ICD-10-CM | POA: Insufficient documentation
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
## 2021-06-26 NOTE — Therapy (Signed)
[facility] [address] [place], [place], [address] Phone: [phone]   Fax:  [phone]  Physical Therapy Treatment  Patient Details  Name: Joanna Wright MRN: [id number] Date of Birth: 08/12/1971 Referring Provider (PT): Referred by Lauraine Rinne. Followed by Leeroy Cha, MD. Surgeon Katha Hamming, MD)   Encounter Date: 06/26/2021   PT End of Session - 06/26/21 1406     Visit Number 17    Number of Visits 22    Date for PT Re-Evaluation 07/20/21    Authorization Type [employer]: insurance reset on 03/23/21 with 25 visits for OT, 25 vists for PT per pt report    Authorization - Visit Number 12    Authorization - Number of Visits 25    PT Start Time 2376    PT Stop Time 1443    PT Time Calculation (min) 38 min    Behavior During Therapy WFL for tasks assessed/performed             Past Medical History:  Diagnosis Date   DUB (dysfunctional uterine bleeding)    Hx   Hypertension    MHA (microangiopathic hemolytic anemia) ([facility])    Hx   Night sweats    Hx   Psoriasis of scalp     Past Surgical History:  Procedure Laterality Date   ACHILLES TENDON REPAIR Right 2010   BREAST BIOPSY Right 04/06/2019   CERVICAL POLYPECTOMY N/A 05/19/2014   Procedure: CERVICAL POLYPECTOMY;  Surgeon: Sharene Butters, MD;  Location: [facility];  Service: Gynecology;  Laterality: N/A;   EXTERNAL FIXATION LEG Left 01/03/2021   Procedure: EXTERNAL FIXATION ANKLE LEFT and right distal radius fracture ORIF;  Surgeon: Shona Needles, MD;  Location: [facility];  Service: Orthopedics;  Laterality: Left;   EXTERNAL FIXATION REMOVAL Left 01/08/2021   Procedure: REMOVAL EXTERNAL FIXATION LEG;  Surgeon: Shona Needles, MD;  Location: [facility];  Service: Orthopedics;  Laterality: Left;   HARDWARE REMOVAL Right 03/02/2021   Procedure: HARDWARE REMOVAL RIGHT ARM AND MANIPULATION OF LEFT ANKLE;  Surgeon: Shona Needles, MD;  Location: [facility];   Service: Orthopedics;  Laterality: Right;   HYSTEROSCOPY N/A 05/19/2014   Procedure: HYSTEROSCOPY;  Surgeon: Sharene Butters, MD;  Location: [facility];  Service: Gynecology;  Laterality: N/A;   OPEN REDUCTION INTERNAL FIXATION (ORIF) TIBIA/FIBULA FRACTURE Left 01/08/2021   Procedure: OPEN REDUCTION INTERNAL FIXATION (ORIF) PILON FRACTURE;  Surgeon: Shona Needles, MD;  Location: [facility];  Service: Orthopedics;  Laterality: Left;   WISDOM TOOTH EXTRACTION      There were no vitals filed for this visit.   Subjective Assessment - 06/26/21 1407     Subjective Pt reports that work is going well. Working 12 hours/week over 3 days. She does still get some stiffness when sitting for longer.    Patient is accompained by: Family member   spouse   Pertinent History HTN    Limitations Standing;House hold activities;Walking    How long can you walk comfortably? 100 ft    Currently in Pain? No/denies    Pain Onset More than a month ago                               [facility] Adult PT Treatment/Exercise - 06/26/21 1408       Ambulation/Gait   Ambulation/Gait Yes    Ambulation/Gait Assistance 5: Supervision    Ambulation/Gait Assistance Details Verbal  cues to try to increase step length and stance on left leg.    Ambulation Distance (Feet) 115 Feet   x 2 before and after treadmill. Pt reported more ease in movement after treadmill   Assistive device None    Gait Pattern Step-through pattern;Decreased stance time - left;Decreased step length - right    Ambulation Surface Level;Indoor    Gait Comments Treadmill x 8 min at 0.47mph with verbal cues to increase right step length to ride left leg back further.      Neuro Re-ed    Neuro Re-ed Details  In // bars: stepping over 2x4" bolster x 10 with RLE and back to increase left weight shift with light UE support. Standing with toes on top of beam and heels on floot for DF stretch 30 sec x 2. Standing on rockerboard positioned ant/post:  trying to maintain level x 30 sec, rocking board ant/post x 10, step-up on board with LLE then tapping right foot on cone then back x 10. SLS on airex with tapping 3 cones. Sit to stand x 10 from edge of mat with staggered stance with verbal cues to have LLE posterior.                     PT Education - 06/26/21 1444     Education Details PT discussed pt starting more of a walking program with beginning at 8-10 minutes and adding a 1-2 minutes every few days to try to get up to 15 min.              PT Short Term Goals - 06/06/21 1410       PT SHORT TERM GOAL #1   Title Patient will report completion of daily walking program >/= 15 minutes to demo improved gait tolerance    Baseline 10 minutes    Time 3    Period Weeks    Status New    Target Date 06/29/21               PT Long Term Goals - 06/06/21 1411       PT LONG TERM GOAL #1   Title Patient will be independent with Final HEP for ROM/Strengthening/Balance (All LTGs Due: 07/20/21)    Baseline reports independence with curernt HEP but will continue to progress    Time 6    Period Weeks    Status On-going    Target Date 07/20/21      PT LONG TERM GOAL #2   Title Patient will improve gait speed to >/= 3.0 ft/sec without AD and demo equal step length to demonstrate imrpoved household/community mobility and normalized gait pattern.    Baseline 0.78 ft/sec; 1.3 ft/sec; 2.79 ft/sec    Time 6    Period Weeks    Status Revised      PT LONG TERM GOAL #3   Title Patient will improve DF to >/= neutral (0 deg) and PF to >/= 25 degs    Baseline DF -5 deg, PF 14 deg; DF - 4 deg, PF: 18; DF -4, PF: 18-19    Time 6    Period Weeks    Status On-going      PT LONG TERM GOAL #4   Title Patient will be able to ambulate >/= 500 ft outdoors grass surface without AD and Mod I    Baseline 750 ft outdoors with no AD and supervision on grass, Mod I on pavement    Time 6  Period Weeks    Status New      PT LONG  TERM GOAL #5   Title Patient will report completion of daily walking program >/= 25 minutes for improved endurance and activity tolerance with no reports of pain    Baseline 10 minutes    Time 6    Period Weeks    Status New      PT LONG TERM GOAL #6   Title Patient will be able to hold SLS for >/= 10seconds on LLE to demo improved stability/balance    Baseline 5-6 seconds; continued to be around 6 seconds on LLE    Time 6    Period Weeks    Status On-going                   Plan - 06/26/21 2003     Clinical Impression Statement Pt did well on treadmill with increasing left stance time as went on with carryover after overground. Pt reported feeling good on treadmill. Continued to focus on increasing left SLS time with activities.    Personal Factors and Comorbidities Comorbidity 1    Comorbidities HTN    Examination-Activity Limitations Bathing;Stairs;Stand;Transfers;Locomotion Level;Dressing;Squat;Hygiene/Grooming    Examination-Participation Restrictions Driving;Community Activity;Occupation    Stability/Clinical Decision Making Stable/Uncomplicated    Rehab Potential Good    PT Frequency 1x / week    PT Duration 6 weeks    PT Treatment/Interventions ADLs/Self Care Home Management;Cryotherapy;Electrical Stimulation;Moist Heat;DME Instruction;Gait training;Stair training;Functional mobility training;Therapeutic activities;Therapeutic exercise;Balance training;Neuromuscular re-education;Cognitive remediation;Patient/family education;Manual techniques;Passive range of motion;Dry needling;Taping;Joint Manipulations    PT Next Visit Plan Check STG. Continued strengthening/weight shift activities. ROM exercises for ankle. Gastroc stretch. Gait training witout AD emphasis on equal stance and step lenght,  use of treadmill for gait.    Consulted and Agree with Plan of Care Patient;Family member/caregiver             Patient will benefit from skilled therapeutic intervention in  order to improve the following deficits and impairments:  Abnormal gait, Decreased activity tolerance, Decreased endurance, Decreased knowledge of use of DME, Decreased range of motion, Decreased strength, Impaired sensation, Pain, Difficulty walking, Decreased balance  Visit Diagnosis: Other abnormalities of gait and mobility  Muscle weakness (generalized)     Problem List Patient Active Problem List   Diagnosis Date Noted   Hot flashes 04/25/2021   Amenorrhea 04/25/2021   Vitamin D deficiency 03/14/2021   Hyperlipidemia 03/14/2021   Closed fracture of right wrist 02/27/2021   Thrombocytosis    Closed fracture of left distal tibia 01/11/2021   Tibia/fibula fracture 01/03/2021   Closed left pilon fracture 01/03/2021   Closed fracture of right distal radius 01/03/2021   Closed disp fracture of base of second metacarpal bone of right hand 01/03/2021   Anemia 02/13/2015   Essential hypertension 02/13/2015   Psoriasis of scalp 06/22/2012   Preventative health care 06/20/2011   ANOSMIA 03/09/2010   Dysfunctional uterine bleeding 03/15/2008   CONTACT DERMATITIS&OTH ECZEMA DUE OTH [facility]ENT 03/15/2008    Electa Sniff, PT, DPT, NCS 06/26/2021, 8:05 PM  [facility] [address] [place], [place], [address] Phone: [phone]   Fax:  [phone]  Name: Joanna Wright MRN: [id number] Date of Birth: 1971/04/08

## 2021-07-02 ENCOUNTER — Ambulatory Visit: Payer: No Typology Code available for payment source | Admitting: Occupational Therapy

## 2021-07-02 ENCOUNTER — Ambulatory Visit: Payer: No Typology Code available for payment source

## 2021-07-10 ENCOUNTER — Ambulatory Visit: Payer: No Typology Code available for payment source | Admitting: Occupational Therapy

## 2021-07-10 ENCOUNTER — Other Ambulatory Visit: Payer: Self-pay

## 2021-07-10 ENCOUNTER — Ambulatory Visit: Payer: No Typology Code available for payment source

## 2021-07-10 DIAGNOSIS — M25631 Stiffness of right wrist, not elsewhere classified: Secondary | ICD-10-CM

## 2021-07-10 DIAGNOSIS — M6281 Muscle weakness (generalized): Secondary | ICD-10-CM

## 2021-07-10 DIAGNOSIS — M25641 Stiffness of right hand, not elsewhere classified: Secondary | ICD-10-CM

## 2021-07-10 DIAGNOSIS — R2689 Other abnormalities of gait and mobility: Secondary | ICD-10-CM

## 2021-07-10 DIAGNOSIS — M25672 Stiffness of left ankle, not elsewhere classified: Secondary | ICD-10-CM

## 2021-07-10 NOTE — Therapy (Signed)
[facility] [address] [place], [place], [address] Phone: [phone]   Fax:  [phone]  Physical Therapy Treatment  Patient Details  Name: Joanna Wright MRN: [id number] Date of Birth: Dec 26, 1970 Referring Provider (PT): Referred by Lauraine Rinne. Followed by Leeroy Cha, MD. Surgeon Katha Hamming, MD)   Encounter Date: 07/10/2021   PT End of Session - 07/10/21 1319     Visit Number 18    Number of Visits 22    Date for PT Re-Evaluation 07/20/21    Authorization Type [employer]: insurance reset on 03/23/21 with 25 visits for OT, 25 vists for PT per pt report    Authorization - Visit Number 18    Authorization - Number of Visits 25    PT Start Time 1316    PT Stop Time 1400    PT Time Calculation (min) 44 min    Behavior During Therapy WFL for tasks assessed/performed             Past Medical History:  Diagnosis Date   DUB (dysfunctional uterine bleeding)    Hx   Hypertension    MHA (microangiopathic hemolytic anemia) ([facility])    Hx   Night sweats    Hx   Psoriasis of scalp     Past Surgical History:  Procedure Laterality Date   ACHILLES TENDON REPAIR Right 2010   BREAST BIOPSY Right 04/06/2019   CERVICAL POLYPECTOMY N/A 05/19/2014   Procedure: CERVICAL POLYPECTOMY;  Surgeon: Sharene Butters, MD;  Location: [facility];  Service: Gynecology;  Laterality: N/A;   EXTERNAL FIXATION LEG Left 01/03/2021   Procedure: EXTERNAL FIXATION ANKLE LEFT and right distal radius fracture ORIF;  Surgeon: Shona Needles, MD;  Location: [facility];  Service: Orthopedics;  Laterality: Left;   EXTERNAL FIXATION REMOVAL Left 01/08/2021   Procedure: REMOVAL EXTERNAL FIXATION LEG;  Surgeon: Shona Needles, MD;  Location: [facility];  Service: Orthopedics;  Laterality: Left;   HARDWARE REMOVAL Right 03/02/2021   Procedure: HARDWARE REMOVAL RIGHT ARM AND MANIPULATION OF LEFT ANKLE;  Surgeon: Shona Needles, MD;  Location: [facility];   Service: Orthopedics;  Laterality: Right;   HYSTEROSCOPY N/A 05/19/2014   Procedure: HYSTEROSCOPY;  Surgeon: Sharene Butters, MD;  Location: [facility];  Service: Gynecology;  Laterality: N/A;   OPEN REDUCTION INTERNAL FIXATION (ORIF) TIBIA/FIBULA FRACTURE Left 01/08/2021   Procedure: OPEN REDUCTION INTERNAL FIXATION (ORIF) PILON FRACTURE;  Surgeon: Shona Needles, MD;  Location: [facility];  Service: Orthopedics;  Laterality: Left;   WISDOM TOOTH EXTRACTION      There were no vitals filed for this visit.   Subjective Assessment - 07/10/21 1319     Subjective Pt reports she is doing well. She is walking a lot. Feels like she is walking a lot. Doing at >15 minutes at least 3x/week.    Patient is accompained by: Family member   spouse   Pertinent History HTN    Limitations Standing;House hold activities;Walking    How long can you walk comfortably? 100 ft    Currently in Pain? No/denies    Pain Onset More than a month ago                               [facility] Adult PT Treatment/Exercise - 07/10/21 1322       Ambulation/Gait   Ambulation/Gait Yes    Ambulation/Gait Assistance 5: Supervision    Ambulation/Gait Assistance  Details Walked over ground after treadmill with better left stance time noted and less antalgic pattern. Pt was cued to try to increase right step length.    Ambulation Distance (Feet) 230 Feet    Assistive device None    Gait Pattern Step-through pattern;Decreased stance time - left;Decreased step length - right    Ambulation Surface Level;Indoor    Gait Comments Treadmill x 10 min gradually increasing to 1.8mh with verbal cues to increase right step length to ride left leg back further.      Neuro Re-ed    Neuro Re-ed Details  Standing on ramp facing uphill:  tapping 2 therastones in front with RLE CGA x 10 to increase left weight shift. Then performed with holding right foot on stone x 5 sec x 5. In // bars on rockerboard positioned ant/post: trying to  maintain level x 30 sec, then with added in manual pertubations x 30 sec, rocking board ant/post x 10, tapping cone with RLE x 10, stepping up with LLE on rockerboard and tapping cone with RLE x 10 with light RUE support.                       PT Short Term Goals - 07/10/21 1320       PT SHORT TERM GOAL #1   Title Patient will report completion of daily walking program >/= 15 minutes to demo improved gait tolerance    Baseline 10 minutes. 07/10/21 >15 minutes, 3x/week.    Time 3    Period Weeks    Status Partially Met    Target Date 06/29/21               PT Long Term Goals - 06/06/21 1411       PT LONG TERM GOAL #1   Title Patient will be independent with Final HEP for ROM/Strengthening/Balance (All LTGs Due: 07/20/21)    Baseline reports independence with curernt HEP but will continue to progress    Time 6    Period Weeks    Status On-going    Target Date 07/20/21      PT LONG TERM GOAL #2   Title Patient will improve gait speed to >/= 3.0 ft/sec without AD and demo equal step length to demonstrate imrpoved household/community mobility and normalized gait pattern.    Baseline 0.78 ft/sec; 1.3 ft/sec; 2.79 ft/sec    Time 6    Period Weeks    Status Revised      PT LONG TERM GOAL #3   Title Patient will improve DF to >/= neutral (0 deg) and PF to >/= 25 degs    Baseline DF -5 deg, PF 14 deg; DF - 4 deg, PF: 18; DF -4, PF: 18-19    Time 6    Period Weeks    Status On-going      PT LONG TERM GOAL #4   Title Patient will be able to ambulate >/= 500 ft outdoors grass surface without AD and Mod I    Baseline 750 ft outdoors with no AD and supervision on grass, Mod I on pavement    Time 6    Period Weeks    Status New      PT LONG TERM GOAL #5   Title Patient will report completion of daily walking program >/= 25 minutes for improved endurance and activity tolerance with no reports of pain    Baseline 10 minutes    Time 6  Period Weeks    Status New       PT LONG TERM GOAL #6   Title Patient will be able to hold SLS for >/= 10seconds on LLE to demo improved stability/balance    Baseline 5-6 seconds; continued to be around 6 seconds on LLE    Time 6    Period Weeks    Status On-going                   Plan - 07/10/21 1456     Clinical Impression Statement Pt partially met STG today reporting she is walking >15 probably 2-3x/week. PT continues to work on increasing left ankle DF ROM with activities and increasing stance time on left. Pt responds well to treadmill with carryover on ground after.    Personal Factors and Comorbidities Comorbidity 1    Comorbidities HTN    Examination-Activity Limitations Bathing;Stairs;Stand;Transfers;Locomotion Level;Dressing;Squat;Hygiene/Grooming    Examination-Participation Restrictions Driving;Community Activity;Occupation    Stability/Clinical Decision Making Stable/Uncomplicated    Rehab Potential Good    PT Frequency 1x / week    PT Duration 6 weeks    PT Treatment/Interventions ADLs/Self Care Home Management;Cryotherapy;Electrical Stimulation;Moist Heat;DME Instruction;Gait training;Stair training;Functional mobility training;Therapeutic activities;Therapeutic exercise;Balance training;Neuromuscular re-education;Cognitive remediation;Patient/family education;Manual techniques;Passive range of motion;Dry needling;Taping;Joint Manipulations    PT Next Visit Plan Check LTGs next week for recert. Continue strengthening/weight shift activities. ROM exercises for ankle. Gastroc stretch. Gait training witout AD emphasis on equal stance and step length,  use of treadmill for gait.    Consulted and Agree with Plan of Care Patient;Family member/caregiver             Patient will benefit from skilled therapeutic intervention in order to improve the following deficits and impairments:  Abnormal gait, Decreased activity tolerance, Decreased endurance, Decreased knowledge of use of DME, Decreased  range of motion, Decreased strength, Impaired sensation, Pain, Difficulty walking, Decreased balance  Visit Diagnosis: Other abnormalities of gait and mobility  Muscle weakness (generalized)  Stiffness of left ankle, not elsewhere classified     Problem List Patient Active Problem List   Diagnosis Date Noted   Hot flashes 04/25/2021   Amenorrhea 04/25/2021   Vitamin D deficiency 03/14/2021   Hyperlipidemia 03/14/2021   Closed fracture of right wrist 02/27/2021   Thrombocytosis    Closed fracture of left distal tibia 01/11/2021   Tibia/fibula fracture 01/03/2021   Closed left pilon fracture 01/03/2021   Closed fracture of right distal radius 01/03/2021   Closed disp fracture of base of second metacarpal bone of right hand 01/03/2021   Anemia 02/13/2015   Essential hypertension 02/13/2015   Psoriasis of scalp 06/22/2012   Preventative health care 06/20/2011   ANOSMIA 03/09/2010   Dysfunctional uterine bleeding 03/15/2008   CONTACT DERMATITIS&OTH ECZEMA DUE OTH SPEC AGENT 03/15/2008    Electa Sniff, PT, DPT, NCS 07/10/2021, 2:59 PM  [facility] [address] [place], [place], [address] Phone: [phone]   Fax:  [phone]  Name: Joanna Wright MRN: [id number] Date of Birth: Aug 20, 1971
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
## 2021-07-16 ENCOUNTER — Telehealth: Payer: Self-pay | Admitting: Family

## 2021-07-16 ENCOUNTER — Ambulatory Visit: Payer: No Typology Code available for payment source | Admitting: Family

## 2021-07-16 ENCOUNTER — Other Ambulatory Visit: Payer: Self-pay

## 2021-07-16 VITALS — BP 118/68 | HR 79 | Temp 98.7°F | Resp 16 | Wt 186.0 lb

## 2021-07-16 DIAGNOSIS — Z23 Encounter for immunization: Secondary | ICD-10-CM | POA: Diagnosis not present

## 2021-07-16 DIAGNOSIS — R232 Flushing: Secondary | ICD-10-CM

## 2021-07-16 DIAGNOSIS — I10 Essential (primary) hypertension: Secondary | ICD-10-CM

## 2021-07-16 MED ORDER — NORETHIN ACE-ETH ESTRAD-FE 1.5-30 MG-MCG PO TABS: 1.0000 | ORAL_TABLET | Freq: Every day | ORAL | 4 refills | Status: DC

## 2021-07-16 MED ORDER — NORETHINDRONE 0.35 MG PO TABS: 1.0000 | ORAL_TABLET | Freq: Every day | ORAL | 11 refills | Status: DC

## 2021-07-16 MED ORDER — AMLODIPINE BESYLATE 10 MG PO TABS: 10.0000 mg | ORAL_TABLET | Freq: Every day | ORAL | 1 refills | Status: DC

## 2021-07-16 NOTE — Assessment & Plan Note (Addendum)
BP Readings from Last 3 Encounters:  07/16/21 118/68  05/25/21 124/64  04/25/21 140/77   BP is stable and at goal. Continue amlodipine 100mg  once daily.

## 2021-07-16 NOTE — Assessment & Plan Note (Signed)
See mychart message 10/24.  Continue micronor. Consider GYN referral if symptoms worsen.

## 2021-07-16 NOTE — Telephone Encounter (Signed)
See mychart.  

## 2021-07-16 NOTE — Progress Notes (Signed)
Subjective:   By signing my name below, I, Joanna Wright, attest that this documentation has been prepared under the direction and in the presence of Joanna Wright. 07/16/2021    Patient ID: Joanna Wright, female    DOB: [birth year], 50 y.o.   MRN: [id number]  Chief Complaint  Patient presents with   Hypertension    Here for follow up    Hypertension  Patient is in today for a office visit.   Blood pressure- Her blood pressure is elevated during this visit. She continues taking 10 mg amlodipine daily PO and reports no new issues while taking it. She is requesting a refill on it as well.   BP Readings from Last 3 Encounters:  07/16/21 118/68  05/25/21 124/64  04/25/21 140/77   Pulse Readings from Last 3 Encounters:  07/16/21 79  05/25/21 80  04/25/21 (!) 104   Birth Control- She continues taking 0.35 mg Micronor daily PO and reports having hot flashes and night sweats. She is requesting a refill on it as well. She reports her recent menstrual cycle was light.  Gabapentin- She no longer takes gabapentin.  Immunizations- She is receiving a flu vaccine and her 2nd shingrix vaccine during this visit. She was informed of the new bivalent Covid-19 vaccine.    Health Maintenance Due  Topic Date Due   Hepatitis C Screening  Never done   COVID-19 Vaccine (4 - Booster for Pfizer series) 05/30/2021    Past Medical History:  Diagnosis Date   DUB (dysfunctional uterine bleeding)    Hx   Hypertension    MHA (microangiopathic hemolytic anemia) (HCC)    Hx   Night sweats    Hx   Psoriasis of scalp     Past Surgical History:  Procedure Laterality Date   ACHILLES TENDON REPAIR Right 2010   BREAST BIOPSY Right 04/06/2019   CERVICAL POLYPECTOMY N/A 05/19/2014   Procedure: CERVICAL POLYPECTOMY;  Surgeon: Sharene Butters, MD;  Location: [facility];  Service: Gynecology;  Laterality: N/A;   EXTERNAL FIXATION LEG Left 01/03/2021   Procedure: EXTERNAL FIXATION ANKLE LEFT and  right distal radius fracture ORIF;  Surgeon: Shona Needles, MD;  Location: [facility];  Service: Orthopedics;  Laterality: Left;   EXTERNAL FIXATION REMOVAL Left 01/08/2021   Procedure: REMOVAL EXTERNAL FIXATION LEG;  Surgeon: Shona Needles, MD;  Location: [place];  Service: Orthopedics;  Laterality: Left;   HARDWARE REMOVAL Right 03/02/2021   Procedure: HARDWARE REMOVAL RIGHT ARM AND MANIPULATION OF LEFT ANKLE;  Surgeon: Shona Needles, MD;  Location: [place];  Service: Orthopedics;  Laterality: Right;   HYSTEROSCOPY N/A 05/19/2014   Procedure: HYSTEROSCOPY;  Surgeon: Sharene Butters, MD;  Location: [facility];  Service: Gynecology;  Laterality: N/A;   OPEN REDUCTION INTERNAL FIXATION (ORIF) TIBIA/FIBULA FRACTURE Left 01/08/2021   Procedure: OPEN REDUCTION INTERNAL FIXATION (ORIF) PILON FRACTURE;  Surgeon: Shona Needles, MD;  Location: [place];  Service: Orthopedics;  Laterality: Left;   WISDOM TOOTH EXTRACTION      Family History  Problem Relation Age of Onset   Hypertension Mother        had "bleeding on the brain"   Glaucoma Father    Blindness Father    Peripheral vascular disease Father    Cancer Maternal Grandfather        unsure what type   Blindness Paternal Grandfather    Hypertension Other    Kidney disease Neg Hx    Diabetes Neg  Hx    Colon cancer Neg Hx    Colon polyps Neg Hx    Esophageal cancer Neg Hx    Rectal cancer Neg Hx    Stomach cancer Neg Hx     Social History   Socioeconomic History   Marital status: Married    Spouse name: Not on file   Number of children: Not on file   Years of education: Not on file   Highest education level: Not on file  Occupational History   Not on file  Tobacco Use   Smoking status: Never   Smokeless tobacco: Never  Vaping Use   Vaping Use: Never used  Substance and Sexual Activity   Alcohol use: Not Currently    Alcohol/week: 0.0 standard drinks    Comment: 1 drink per month   Drug use: No   Sexual activity: Yes     Partners: Male    Birth control/protection: Condom  Other Topics Concern   Not on file  Social History Narrative   Recently remarried   No children   Works in Therapist, art, [employer]   Completed HS   Grew up in [place]   Enjoys sleeping and shopping   Has a pitbull   Social Determinants of Health   Financial Resource Strain: Not on file  Food Insecurity: Not on file  Transportation Needs: Not on file  Physical Activity: Not on file  Stress: Not on file  Social Connections: Not on file  Intimate Partner Violence: Not on file    Outpatient Medications Prior to Visit  Medication Sig Dispense Refill   acetaminophen (TYLENOL) 500 MG tablet Take 500 mg by mouth every 6 (six) hours as needed.     amLODipine (NORVASC) 10 MG tablet TAKE 1 TABLET BY MOUTH EVERY DAY 90 tablet 1   gabapentin (NEURONTIN) 300 MG capsule Take 1 capsule (300 mg total) by mouth 3 (three) times daily. 90 capsule 3   norethindrone (MICRONOR) 0.35 MG tablet Take 1 tablet (0.35 mg total) by mouth daily. 28 tablet 11   No facility-administered medications prior to visit.    No Known Allergies  ROS     Objective:    Physical Exam Constitutional:      General: She is not in acute distress.    Appearance: Normal appearance. She is not ill-appearing.  HENT:     Head: Normocephalic and atraumatic.     Right Ear: External ear normal.     Left Ear: External ear normal.  Eyes:     Extraocular Movements: Extraocular movements intact.     Pupils: Pupils are equal, round, and reactive to light.  Cardiovascular:     Rate and Rhythm: Normal rate and regular rhythm.     Heart sounds: Normal heart sounds. No murmur heard.   No gallop.     Comments: Her blood pressure measured 118/68 during this PE Pulmonary:     Effort: Pulmonary effort is normal. No respiratory distress.     Breath sounds: Normal breath sounds. No wheezing or rales.  Skin:    General: Skin is warm and dry.  Neurological:      Mental Status: She is alert and oriented to person, place, and time.  Psychiatric:        Behavior: Behavior normal.    BP 118/68   Pulse 79   Temp 98.7 F (37.1 C) (Oral)   Resp 16   Wt 186 lb (84.4 kg)   SpO2 100%   BMI 32.95  kg/m  Wt Readings from Last 3 Encounters:  07/16/21 186 lb (84.4 kg)  05/25/21 186 lb (84.4 kg)  05/11/21 186 lb (84.4 kg)       Assessment & Plan:   Problem List Items Addressed This Visit       Unprioritized   Hot flashes    See mychart message 10/24.  Continue micronor. Consider GYN referral if symptoms worsen.       Relevant Medications   amLODipine (NORVASC) 10 MG tablet   Essential hypertension    BP Readings from Last 3 Encounters:  07/16/21 118/68  05/25/21 124/64  04/25/21 140/77  BP is stable and at goal. Continue amlodipine 100mg  once daily.         Relevant Medications   amLODipine (NORVASC) 10 MG tablet   Other Visit Diagnoses     Need for shingles vaccine    -  Primary   Relevant Orders   Varicella-zoster vaccine IM (Shingrix) (Completed)   Needs flu shot       Relevant Orders   Flu Vaccine QUAD 6+ mos PF IM (Fluarix Quad PF) (Completed)        Meds ordered this encounter  Medications   amLODipine (NORVASC) 10 MG tablet    Sig: Take 1 tablet (10 mg total) by mouth daily.    Dispense:  90 tablet    Refill:  1    Order Specific Question:   Supervising Provider    Answer:   Penni Homans A [[id number]]   DISCONTD: norethindrone-ethinyl estradiol-iron (MICROGESTIN FE 1.5/30) 1.5-30 MG-MCG tablet    Sig: Take 1 tablet by mouth daily.    Dispense:  84 tablet    Refill:  4    Order Specific Question:   Supervising Provider    Answer:   Penni Homans A [[id number]]    I, Joanna Wright, personally preformed the services described in this documentation.  All medical record entries made by the scribe were at my direction and in my presence.  I have reviewed the chart and discharge instructions (if applicable) and agree  that the record reflects my personal performance and is accurate and complete. 07/16/2021   I,Joanna Wright,acting as a Education administrator for Nance Pear, Wright.,have documented all relevant documentation on the behalf of Nance Pear, Wright,as directed by  Nance Pear, Wright while in the presence of Nance Pear, Wright.   Nance Pear, Wright

## 2021-07-17 ENCOUNTER — Ambulatory Visit: Payer: No Typology Code available for payment source

## 2021-07-17 ENCOUNTER — Ambulatory Visit: Payer: No Typology Code available for payment source | Admitting: Occupational Therapy

## 2021-07-17 ENCOUNTER — Encounter: Payer: Self-pay | Admitting: Family

## 2021-07-17 DIAGNOSIS — M6281 Muscle weakness (generalized): Secondary | ICD-10-CM

## 2021-07-17 DIAGNOSIS — M25641 Stiffness of right hand, not elsewhere classified: Secondary | ICD-10-CM | POA: Diagnosis not present

## 2021-07-17 DIAGNOSIS — M25631 Stiffness of right wrist, not elsewhere classified: Secondary | ICD-10-CM

## 2021-07-17 DIAGNOSIS — R262 Difficulty in walking, not elsewhere classified: Secondary | ICD-10-CM

## 2021-07-17 DIAGNOSIS — R2689 Other abnormalities of gait and mobility: Secondary | ICD-10-CM

## 2021-07-17 NOTE — Therapy (Signed)
[facility] [address] [place], [place], [address] Phone: [phone]   Fax:  [phone]  Physical Therapy Treatment/Re-Certification  Patient Details  Name: Joanna Wright MRN: [id number] Date of Birth: Jul 15, 1971 Referring Provider (PT): Referred by Lauraine Rinne. Followed by Leeroy Cha, MD. Surgeon Katha Hamming, MD)   Encounter Date: 07/17/2021   PT End of Session - 07/17/21 1450     Visit Number 19    Number of Visits 22    Date for PT Re-Evaluation 08/07/21    Authorization Type [employer]: insurance reset on 03/23/21 with 25 visits for OT, 25 vists for PT per pt report    Authorization - Visit Number 14    Authorization - Number of Visits 25    PT Start Time 6195    PT Stop Time 1529    PT Time Calculation (min) 41 min    Activity Tolerance Patient tolerated treatment well    Behavior During Therapy WFL for tasks assessed/performed             Past Medical History:  Diagnosis Date   DUB (dysfunctional uterine bleeding)    Hx   Hypertension    MHA (microangiopathic hemolytic anemia) ([facility])    Hx   Night sweats    Hx   Psoriasis of scalp     Past Surgical History:  Procedure Laterality Date   ACHILLES TENDON REPAIR Right 2010   BREAST BIOPSY Right 04/06/2019   CERVICAL POLYPECTOMY N/A 05/19/2014   Procedure: CERVICAL POLYPECTOMY;  Surgeon: Sharene Butters, MD;  Location: [facility];  Service: Gynecology;  Laterality: N/A;   EXTERNAL FIXATION LEG Left 01/03/2021   Procedure: EXTERNAL FIXATION ANKLE LEFT and right distal radius fracture ORIF;  Surgeon: Shona Needles, MD;  Location: [place];  Service: Orthopedics;  Laterality: Left;   EXTERNAL FIXATION REMOVAL Left 01/08/2021   Procedure: REMOVAL EXTERNAL FIXATION LEG;  Surgeon: Shona Needles, MD;  Location: [facility];  Service: Orthopedics;  Laterality: Left;   HARDWARE REMOVAL Right 03/02/2021   Procedure: HARDWARE REMOVAL RIGHT ARM AND  MANIPULATION OF LEFT ANKLE;  Surgeon: Shona Needles, MD;  Location: [place];  Service: Orthopedics;  Laterality: Right;   HYSTEROSCOPY N/A 05/19/2014   Procedure: HYSTEROSCOPY;  Surgeon: Sharene Butters, MD;  Location: [facility];  Service: Gynecology;  Laterality: N/A;   OPEN REDUCTION INTERNAL FIXATION (ORIF) TIBIA/FIBULA FRACTURE Left 01/08/2021   Procedure: OPEN REDUCTION INTERNAL FIXATION (ORIF) PILON FRACTURE;  Surgeon: Shona Needles, MD;  Location: [place];  Service: Orthopedics;  Laterality: Left;   WISDOM TOOTH EXTRACTION      There were no vitals filed for this visit.   Subjective Assessment - 07/17/21 1451     Subjective Patient reports that went to [facility] and walked the entire time. Was sore after but resolved quickly. No other new changes.    Patient is accompained by: Family member   spouse   Pertinent History HTN    Limitations Standing;House hold activities;Walking    How long can you walk comfortably? 100 ft    Currently in Pain? No/denies    Pain Onset More than a month ago                [facility] PT Assessment - 07/17/21 0001       Assessment   Medical Diagnosis s/p ORIF Rt distal radius fx and Rt 2nd metacarpal fx    Referring Provider (PT) Referred by Lauraine Rinne.  Followed by Leeroy Cha, MD. Surgeon Katha Hamming, MD)              [facility] Adult PT Treatment/Exercise - 07/17/21 0001       Transfers   Transfers Sit to Stand;Stand to Sit    Sit to Stand 7: Independent    Stand to Sit 7: Independent      Ambulation/Gait   Ambulation/Gait Yes    Ambulation/Gait Assistance 6: Modified independent (Device/Increase time)    Ambulation/Gait Assistance Details Completed gait outdoors on outdoor surfaces including pavement and grass Mod I x 500 ft. No imabalnce noted, just continue to demo decreased step length with RLE due to stance time on LLE.    Ambulation Distance (Feet) 500 Feet    Assistive device None    Gait Pattern Step-through  pattern;Decreased stance time - left;Decreased step length - right    Ambulation Surface Unlevel;Outdoor;Paved;Grass    Gait velocity 10.98 seconds = 2.98 ft/sec    Ramp 5: Supervision    Ramp Details (indicate cue type and reason) increased challenge with ascending ramp due to decreased DF noted on LLE. completed ascend/descend ramp x 3 reps working on push off and balance with completion. Close supervision.      Neuro Re-ed    Neuro Re-ed Details  SLS on LLE: 9.78 seconds no UE support. Standing staggered stance on incline with LLE posterior, maintaining balance without UE support and eyes open 3 x 30 seconds.      Exercises   Exercises Other Exercises    Other Exercises  Standing on incline with LLE posterior  and lunge forward for increased calf stretch 3 x 30 seconds.      Knee/Hip Exercises: Aerobic   Elliptical Completed elliptical on Level 2.5 x 4 minsforward. Patient tolerating increase in resistance well.                 Balance Exercises - 07/17/21 0001       Balance Exercises: Standing   SLS Eyes open;Solid surface;3 reps;10 secs    Standing, One Foot on a Step Eyes open;6 inch;Limitations    Standing, One Foot on a Step Limitations with LLE posterior and RLE on step x 30 seconds withotu UE support. then completed x 10 reps of horizontal/vertical head turns.    Rockerboard Anterior/posterior;EO;Limitations    Rockerboard Limitations stepping up onto rockerboard with LLE and alternating toe tap to cone with RLE x 10 reps. intermittent touch A/CGA.                PT Education - 07/17/21 1529     Education Details progress toward LTGs    Person(s) Educated Patient    Methods Explanation    Comprehension Verbalized understanding              PT Short Term Goals - 07/10/21 1320       PT SHORT TERM GOAL #1   Title Patient will report completion of daily walking program >/= 15 minutes to demo improved gait tolerance    Baseline 10 minutes. 07/10/21  >15 minutes, 3x/week.    Time 3    Period Weeks    Status Partially Met    Target Date 06/29/21               PT Long Term Goals - 07/17/21 1454       PT LONG TERM GOAL #1   Title Patient will be independent with Final HEP for ROM/Strengthening/Balance (All LTGs Due: 07/20/21)  Baseline reports independence with curernt HEP but will continue to progress    Time 6    Period Weeks    Status On-going      PT LONG TERM GOAL #2   Title Patient will improve gait speed to >/= 3.0 ft/sec without AD and demo equal step length to demonstrate imrpoved household/community mobility and normalized gait pattern.    Baseline 0.78 ft/sec; 1.3 ft/sec; 2.79 ft/sec; 2.98 ft/sec    Time 6    Period Weeks    Status Not Met      PT LONG TERM GOAL #3   Title Patient will improve DF to >/= neutral (0 deg) and PF to >/= 25 degs    Baseline DF -5 deg, PF 14 deg; DF - 4 deg, PF: 18; DF -4, PF: 18-19    Time 6    Period Weeks    Status On-going      PT LONG TERM GOAL #4   Title Patient will be able to ambulate >/= 500 ft outdoors grass surface without AD and Mod I    Baseline 750 ft outdoors with no AD and supervision on grass, Mod I on pavement; Mod I pavement and grass    Time 6    Period Weeks    Status Achieved      PT LONG TERM GOAL #5   Title Patient will report completion of daily walking program >/= 25 minutes for improved endurance and activity tolerance with no reports of pain    Baseline 10 minutes; 15 minutes at a time    Time 6    Period Weeks    Status On-going      PT LONG TERM GOAL #6   Title Patient will be able to hold SLS for >/= 10seconds on LLE to demo improved stability/balance    Baseline 5-6 seconds; continued to be around 6 seconds on LLE; 9.75 secs on LLE    Time 6    Period Weeks    Status Partially Met             Updated Short Term Goals:    PT Short Term Goals - 07/17/21 1801       PT SHORT TERM GOAL #1   Title = LTGs            Updated  Long Term Goals:   PT Long Term Goals - 07/17/21 1801       PT LONG TERM GOAL #1   Title Patient will be independent with Final HEP for ROM/Strengthening/Balance (All LTGs Due: 08/07/21)    Baseline reports independence with curernt HEP. Will continue to benefit from progressive and final HEP prior to d/c    Time 3    Period Weeks    Status Revised    Target Date 08/07/21      PT LONG TERM GOAL #2   Title Patient will improve gait speed to >/= 3.0 ft/sec without AD and demo equal step length to demonstrate imrpoved household/community mobility and normalized gait pattern.    Baseline 0.78 ft/sec; 1.3 ft/sec; 2.79 ft/sec; 2.98 ft/sec    Time 3    Period Weeks    Status On-going      PT LONG TERM GOAL #3   Title Patient will improve DF to >/= neutral (0 deg) and PF to >/= 25 degs    Baseline DF -5 deg, PF 14 deg; DF - 4 deg, PF: 18; DF -4, PF: 18-19    Time  3    Period Weeks    Status On-going      PT LONG TERM GOAL #5   Title Patient will report completion of daily walking program >/= 25 minutes for improved endurance and activity tolerance with no reports of pain    Baseline 10 minutes; 15 minutes at a time    Time 3    Period Weeks    Status On-going      PT LONG TERM GOAL #6   Title Patient will be able to hold SLS for >/= 15 seconds on LLE to demo improved stability/balance    Baseline 5-6 seconds; continued to be around 6 seconds on LLE; 9.75 secs on LLE    Time 3    Period Weeks    Status Revised                 Plan - 07/17/21 1757     Clinical Impression Statement Assessed patient's progress toward LTG, with patient able to demonstrate progress toward goals. Patient able to meet LTG #4 and #6. Patient made progress toward all other LTG at this time. patient continues to present with abnormal gait, decreased ROM, and decreased balance on LLE therefore will continue to benefit from skilled PT services to address ongoing impairments.    Personal Factors and  Comorbidities Comorbidity 1    Comorbidities HTN    Examination-Activity Limitations Bathing;Stairs;Stand;Transfers;Locomotion Level;Dressing;Squat;Hygiene/Grooming    Examination-Participation Restrictions Driving;Community Activity;Occupation    Stability/Clinical Decision Making Stable/Uncomplicated    Rehab Potential Good    PT Frequency 1x / week    PT Duration 3 weeks    PT Treatment/Interventions ADLs/Self Care Home Management;Cryotherapy;Electrical Stimulation;Moist Heat;DME Instruction;Gait training;Stair training;Functional mobility training;Therapeutic activities;Therapeutic exercise;Balance training;Neuromuscular re-education;Cognitive remediation;Patient/family education;Manual techniques;Passive range of motion;Dry needling;Taping;Joint Manipulations    PT Next Visit Plan Continue strengthening/weight shift activities. ROM exercises for ankle. Gastroc stretch. Gait training witout AD emphasis on equal stance and step length,  use of treadmill for gait.    Consulted and Agree with Plan of Care Patient;Family member/caregiver             Patient will benefit from skilled therapeutic intervention in order to improve the following deficits and impairments:  Abnormal gait, Decreased activity tolerance, Decreased endurance, Decreased knowledge of use of DME, Decreased range of motion, Decreased strength, Impaired sensation, Pain, Difficulty walking, Decreased balance  Visit Diagnosis: Other abnormalities of gait and mobility  Muscle weakness (generalized)  Difficulty in walking, not elsewhere classified     Problem List Patient Active Problem List   Diagnosis Date Noted   Hot flashes 04/25/2021   Amenorrhea 04/25/2021   Vitamin D deficiency 03/14/2021   Hyperlipidemia 03/14/2021   Closed fracture of right wrist 02/27/2021   Thrombocytosis    Closed fracture of left distal tibia 01/11/2021   Tibia/fibula fracture 01/03/2021   Closed left pilon fracture 01/03/2021    Closed fracture of right distal radius 01/03/2021   Closed disp fracture of base of second metacarpal bone of right hand 01/03/2021   Anemia 02/13/2015   Essential hypertension 02/13/2015   Psoriasis of scalp 06/22/2012   Preventative health care 06/20/2011   ANOSMIA 03/09/2010   Dysfunctional uterine bleeding 03/15/2008   CONTACT DERMATITIS&OTH ECZEMA DUE OTH [facility] AGENT 03/15/2008    Jones Bales, PT, DPT 07/17/2021, 6:00 PM  [facility] [address] [place], [place], [address] Phone: [phone]   Fax:  [phone]  Name: YAZMEN BRIONES MRN: [id number] Date  of Birth: 22-Mar-1971
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
## 2021-07-18 NOTE — Telephone Encounter (Signed)
I called patient on 07/17/21 and verified her email address and emailed a copy of her receipt to her.

## 2021-07-23 ENCOUNTER — Other Ambulatory Visit: Payer: Self-pay

## 2021-07-23 ENCOUNTER — Ambulatory Visit: Payer: No Typology Code available for payment source | Admitting: Occupational Therapy

## 2021-07-23 DIAGNOSIS — M25631 Stiffness of right wrist, not elsewhere classified: Secondary | ICD-10-CM

## 2021-07-23 DIAGNOSIS — M25641 Stiffness of right hand, not elsewhere classified: Secondary | ICD-10-CM | POA: Diagnosis not present

## 2021-07-23 DIAGNOSIS — R278 Other lack of coordination: Secondary | ICD-10-CM
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
## 2021-07-23 NOTE — Patient Instructions (Signed)
   Updated HEP:   1) Continue putty HEP  2) Weight bearing over table and/or wall push ups, and prayer stretch 3) Continue to make full fist especially with big knuckles held down in flexion 4) Thumb working on wide "backwards C" and "4" 5) Manipulating dimes in hand to stack

## 2021-07-24 ENCOUNTER — Ambulatory Visit: Payer: No Typology Code available for payment source | Attending: Physician Assistant

## 2021-07-24 DIAGNOSIS — M6281 Muscle weakness (generalized): Secondary | ICD-10-CM | POA: Insufficient documentation

## 2021-07-24 DIAGNOSIS — M25672 Stiffness of left ankle, not elsewhere classified: Secondary | ICD-10-CM | POA: Insufficient documentation

## 2021-07-24 DIAGNOSIS — R2689 Other abnormalities of gait and mobility: Secondary | ICD-10-CM | POA: Insufficient documentation

## 2021-07-24 DIAGNOSIS — R262 Difficulty in walking, not elsewhere classified: Secondary | ICD-10-CM | POA: Insufficient documentation

## 2021-07-24 NOTE — Therapy (Signed)
[facility] [address] [place], [place], [address] Phone: [phone]   Fax:  [phone]  Physical Therapy Treatment  Patient Details  Name: SONG MYRE MRN: [id number] Date of Birth: 10/09/1970 Referring Provider (PT): Referred by Lauraine Rinne. Followed by Leeroy Cha, MD. Surgeon Katha Hamming, MD)   Encounter Date: 07/24/2021   PT End of Session - 07/24/21 1448     Visit Number 20    Number of Visits 22    Date for PT Re-Evaluation 08/07/21    Authorization Type [employer]: insurance reset on 03/23/21 with 25 visits for OT, 25 vists for PT per pt report    Authorization - Visit Number 15    Authorization - Number of Visits 25    PT Start Time 0277    PT Stop Time 1528    PT Time Calculation (min) 41 min    Activity Tolerance Patient tolerated treatment well    Behavior During Therapy WFL for tasks assessed/performed             Past Medical History:  Diagnosis Date   DUB (dysfunctional uterine bleeding)    Hx   Hypertension    MHA (microangiopathic hemolytic anemia) ([facility])    Hx   Night sweats    Hx   Psoriasis of scalp     Past Surgical History:  Procedure Laterality Date   ACHILLES TENDON REPAIR Right 2010   BREAST BIOPSY Right 04/06/2019   CERVICAL POLYPECTOMY N/A 05/19/2014   Procedure: CERVICAL POLYPECTOMY;  Surgeon: Sharene Butters, MD;  Location: [facility];  Service: Gynecology;  Laterality: N/A;   EXTERNAL FIXATION LEG Left 01/03/2021   Procedure: EXTERNAL FIXATION ANKLE LEFT and right distal radius fracture ORIF;  Surgeon: Shona Needles, MD;  Location: [facility];  Service: Orthopedics;  Laterality: Left;   EXTERNAL FIXATION REMOVAL Left 01/08/2021   Procedure: REMOVAL EXTERNAL FIXATION LEG;  Surgeon: Shona Needles, MD;  Location: [facility];  Service: Orthopedics;  Laterality: Left;   HARDWARE REMOVAL Right 03/02/2021   Procedure: HARDWARE REMOVAL RIGHT ARM AND MANIPULATION OF LEFT  ANKLE;  Surgeon: Shona Needles, MD;  Location: [facility];  Service: Orthopedics;  Laterality: Right;   HYSTEROSCOPY N/A 05/19/2014   Procedure: HYSTEROSCOPY;  Surgeon: Sharene Butters, MD;  Location: [facility];  Service: Gynecology;  Laterality: N/A;   OPEN REDUCTION INTERNAL FIXATION (ORIF) TIBIA/FIBULA FRACTURE Left 01/08/2021   Procedure: OPEN REDUCTION INTERNAL FIXATION (ORIF) PILON FRACTURE;  Surgeon: Shona Needles, MD;  Location: [facility];  Service: Orthopedics;  Laterality: Left;   WISDOM TOOTH EXTRACTION      There were no vitals filed for this visit.   Subjective Assessment - 07/24/21 1448     Subjective Patient reports no new changes/falls. Patient reports she did alot of walking in [place].    Patient is accompained by: Family member   spouse   Pertinent History HTN    Limitations Standing;House hold activities;Walking    How long can you walk comfortably? 100 ft    Currently in Pain? No/denies    Pain Onset More than a month ago              [facility] Adult PT Treatment/Exercise - 07/24/21 0001       Transfers   Transfers Sit to Stand;Stand to Sit    Sit to Stand 7: Independent    Stand to Sit 7: Independent      Ambulation/Gait   Ambulation/Gait Yes  Ambulation/Gait Assistance 5: Supervision    Ambulation/Gait Assistance Details throughout therapy gym with activities    Assistive device None    Gait Pattern Step-through pattern;Decreased stance time - left;Decreased step length - right    Ambulation Surface Level;Indoor    Stairs Yes    Stairs Assistance 5: Supervision    Stairs Assistance Details (indicate cue type and reason) Completed ascend/descend stairs x 16 steps. Initially patient using step to pattern with descent but progressing to alternating pattern with single rail.    Stair Management Technique One rail Left;Step to pattern;Alternating pattern;Forwards    Number of Stairs 16    Height of Stairs 6    Gait Comments Treadmill x 10 min starting at 1.1 mph  progressing to 1.31mph, then to 1.5 mph with verbal cues to increase right step length to ride left leg back further. At minute 5, added 3% incline with continued work on step length. Patient tolerating well.      Neuro Re-ed    Neuro Re-ed Details  on trampoline: completed gentle bouncing with BLE x 60 seconds. then progressed to single bouncing on LLE 3 x 30 seconds, cues for increased intensity of bounce. Then worked on jumps out/in x 10 reps. Supervision                 Balance Exercises - 07/24/21 0001       Balance Exercises: Standing   SLS Eyes open;Solid surface;Foam/compliant surface;Limitations    SLS Time with firm surface 10-12 seconds; on foam 5-7 seconds. intermittent touch A completed x 3 reps each on LLE    Step Over Hurdles / Cones completed steps over single black beam x 10 reps with RLE to promote weight shift on LLE, then progressed to two black beams x 10 reps for further challenge. cues to stand tall through LLE with completion.    Other Standing Exercises on inverted BOSU with bil stance completed lateral weight shift x 10 reps without UE support, cues for control. Then step ups onto BOSU with LLE and opposite LE drive x 6 reps with BUE support, then x additional 6 with single UE support. standing on incline in staggered stance with LLE completed standing EO x 30 seconds, then progressed to EC 2 x 30 seconds. With EC then completed horizontal/vertical head turns x 10 reps each.                  PT Short Term Goals - 07/17/21 1801       PT SHORT TERM GOAL #1   Title = LTGs               PT Long Term Goals - 07/17/21 1801       PT LONG TERM GOAL #1   Title Patient will be independent with Final HEP for ROM/Strengthening/Balance (All LTGs Due: 08/07/21)    Baseline reports independence with curernt HEP. Will continue to benefit from progressive and final HEP prior to d/c    Time 3    Period Weeks    Status Revised    Target Date 08/07/21       PT LONG TERM GOAL #2   Title Patient will improve gait speed to >/= 3.0 ft/sec without AD and demo equal step length to demonstrate imrpoved household/community mobility and normalized gait pattern.    Baseline 0.78 ft/sec; 1.3 ft/sec; 2.79 ft/sec; 2.98 ft/sec    Time 3    Period Weeks    Status On-going  PT LONG TERM GOAL #3   Title Patient will improve DF to >/= neutral (0 deg) and PF to >/= 25 degs    Baseline DF -5 deg, PF 14 deg; DF - 4 deg, PF: 18; DF -4, PF: 18-19    Time 3    Period Weeks    Status On-going      PT LONG TERM GOAL #5   Title Patient will report completion of daily walking program >/= 25 minutes for improved endurance and activity tolerance with no reports of pain    Baseline 10 minutes; 15 minutes at a time    Time 3    Period Weeks    Status On-going      PT LONG TERM GOAL #6   Title Patient will be able to hold SLS for >/= 15 seconds on LLE to demo improved stability/balance    Baseline 5-6 seconds; continued to be around 6 seconds on LLE; 9.75 secs on LLE    Time 3    Period Weeks    Status Revised                   Plan - 07/24/21 1623     Clinical Impression Statement Continued gait training on treadmill with patient tolerating progression of speed/incline well and continue to demo improved step length. Rest of session focused on continued balance and single leg stance/weight shift onto LLE. Will conitnue to progress toward all LTGs.    Personal Factors and Comorbidities Comorbidity 1    Comorbidities HTN    Examination-Activity Limitations Bathing;Stairs;Stand;Transfers;Locomotion Level;Dressing;Squat;Hygiene/Grooming    Examination-Participation Restrictions Driving;Community Activity;Occupation    Stability/Clinical Decision Making Stable/Uncomplicated    Rehab Potential Good    PT Frequency 1x / week    PT Duration 3 weeks    PT Treatment/Interventions ADLs/Self Care Home Management;Cryotherapy;Electrical Stimulation;Moist  Heat;DME Instruction;Gait training;Stair training;Functional mobility training;Therapeutic activities;Therapeutic exercise;Balance training;Neuromuscular re-education;Cognitive remediation;Patient/family education;Manual techniques;Passive range of motion;Dry needling;Taping;Joint Manipulations    PT Next Visit Plan check goals + D/C. ROM exercises for ankle. Gastroc stretch. Gait training witout AD emphasis on equal stance and step length,  use of treadmill for gait.    Consulted and Agree with Plan of Care Patient;Family member/caregiver             Patient will benefit from skilled therapeutic intervention in order to improve the following deficits and impairments:  Abnormal gait, Decreased activity tolerance, Decreased endurance, Decreased knowledge of use of DME, Decreased range of motion, Decreased strength, Impaired sensation, Pain, Difficulty walking, Decreased balance  Visit Diagnosis: Other abnormalities of gait and mobility  Muscle weakness (generalized)  Difficulty in walking, not elsewhere classified  Stiffness of left ankle, not elsewhere classified     Problem List Patient Active Problem List   Diagnosis Date Noted   Hot flashes 04/25/2021   Amenorrhea 04/25/2021   Vitamin D deficiency 03/14/2021   Hyperlipidemia 03/14/2021   Closed fracture of right wrist 02/27/2021   Thrombocytosis    Closed fracture of left distal tibia 01/11/2021   Tibia/fibula fracture 01/03/2021   Closed left pilon fracture 01/03/2021   Closed fracture of right distal radius 01/03/2021   Closed disp fracture of base of second metacarpal bone of right hand 01/03/2021   Anemia 02/13/2015   Essential hypertension 02/13/2015   Psoriasis of scalp 06/22/2012   Preventative health care 06/20/2011   ANOSMIA 03/09/2010   Dysfunctional uterine bleeding 03/15/2008   CONTACT DERMATITIS&OTH ECZEMA DUE OTH SPEC AGENT 03/15/2008  Jones Bales, PT, DPT 07/24/2021, 4:25 PM  [facility] [address] [place], [place], [address] Phone: [phone]   Fax:  [phone]  Name: ANAID HANEY MRN: [id number] Date of Birth: 1971/04/16

## 2021-07-31 ENCOUNTER — Ambulatory Visit: Payer: No Typology Code available for payment source

## 2021-07-31 ENCOUNTER — Other Ambulatory Visit: Payer: Self-pay

## 2021-07-31 DIAGNOSIS — R262 Difficulty in walking, not elsewhere classified: Secondary | ICD-10-CM

## 2021-07-31 DIAGNOSIS — R2689 Other abnormalities of gait and mobility: Secondary | ICD-10-CM | POA: Diagnosis not present

## 2021-07-31 DIAGNOSIS — M6281 Muscle weakness (generalized): Secondary | ICD-10-CM

## 2021-07-31 DIAGNOSIS — M25672 Stiffness of left ankle, not elsewhere classified: Secondary | ICD-10-CM

## 2021-07-31 NOTE — Therapy (Signed)
[facility] [address] [place], [place], [address] Phone: [phone]   Fax:  [phone]  Physical Therapy Treatment/Discharge Summary  Patient Details  Name: Joanna Wright MRN: [id number] Date of Birth: 11/08/1970 Referring Provider (PT): Referred by Lauraine Rinne. Followed by Leeroy Cha, MD. Surgeon Lennette Bihari Haddix, MD)  PHYSICAL THERAPY DISCHARGE SUMMARY  Visits from Start of Care: 21  Current functional level related to goals / functional outcomes: See Clinical Impression Statement   Remaining deficits: Mild gait abnormality, decreased SLS on LLE   Education / Equipment: HEP/Walking program provided    Patient agrees to discharge. Patient goals were partially met. Patient is being discharged due to meeting the stated rehab goals.   Encounter Date: 07/31/2021   PT End of Session - 07/31/21 1358     Visit Number 21    Number of Visits 22    Date for PT Re-Evaluation 08/07/21    Authorization Type [employer]: insurance reset on 03/23/21 with 25 visits for OT, 25 vists for PT per pt report    Authorization - Visit Number 15    Authorization - Number of Visits 25    PT Start Time 1400    PT Stop Time 0370    PT Time Calculation (min) 43 min    Activity Tolerance Patient tolerated treatment well    Behavior During Therapy WFL for tasks assessed/performed             Past Medical History:  Diagnosis Date   DUB (dysfunctional uterine bleeding)    Hx   Hypertension    MHA (microangiopathic hemolytic anemia) ([facility])    Hx   Night sweats    Hx   Psoriasis of scalp     Past Surgical History:  Procedure Laterality Date   ACHILLES TENDON REPAIR Right 2010   BREAST BIOPSY Right 04/06/2019   CERVICAL POLYPECTOMY N/A 05/19/2014   Procedure: CERVICAL POLYPECTOMY;  Surgeon: Sharene Butters, MD;  Location: [facility];  Service: Gynecology;  Laterality: N/A;   EXTERNAL FIXATION LEG Left 01/03/2021    Procedure: EXTERNAL FIXATION ANKLE LEFT and right distal radius fracture ORIF;  Surgeon: Shona Needles, MD;  Location: [facility];  Service: Orthopedics;  Laterality: Left;   EXTERNAL FIXATION REMOVAL Left 01/08/2021   Procedure: REMOVAL EXTERNAL FIXATION LEG;  Surgeon: Shona Needles, MD;  Location: [facility];  Service: Orthopedics;  Laterality: Left;   HARDWARE REMOVAL Right 03/02/2021   Procedure: HARDWARE REMOVAL RIGHT ARM AND MANIPULATION OF LEFT ANKLE;  Surgeon: Shona Needles, MD;  Location: [facility];  Service: Orthopedics;  Laterality: Right;   HYSTEROSCOPY N/A 05/19/2014   Procedure: HYSTEROSCOPY;  Surgeon: Sharene Butters, MD;  Location: [facility];  Service: Gynecology;  Laterality: N/A;   OPEN REDUCTION INTERNAL FIXATION (ORIF) TIBIA/FIBULA FRACTURE Left 01/08/2021   Procedure: OPEN REDUCTION INTERNAL FIXATION (ORIF) PILON FRACTURE;  Surgeon: Shona Needles, MD;  Location: [facility];  Service: Orthopedics;  Laterality: Left;   WISDOM TOOTH EXTRACTION      There were no vitals filed for this visit.   Subjective Assessment - 07/31/21 1402     Subjective Patient reports no new changes. Had a restful weekend.    Patient is accompained by: Family member   spouse   Pertinent History HTN    Limitations Standing;House hold activities;Walking    How long can you walk comfortably? 100 ft    Currently in Pain? No/denies    Pain Onset More than a  month ago                [facility] PT Assessment - 07/31/21 0001       Assessment   Medical Diagnosis s/p ORIF Rt distal radius fx and Rt 2nd metacarpal fx    Referring Provider (PT) Referred by Lauraine Rinne. Followed by Leeroy Cha, MD. Surgeon Lennette Bihari Haddix, MD)      ROM / Strength   AROM / PROM / Strength AROM      AROM   Overall AROM  Deficits    Right/Left Ankle Left    Left Ankle Dorsiflexion -2    Left Ankle Plantar Flexion 20             [facility] Adult PT Treatment/Exercise - 07/31/21 0001       Transfers   Transfers Sit to  Stand;Stand to Sit    Sit to Stand 7: Independent    Stand to Sit 7: Independent      Ambulation/Gait   Ambulation/Gait Yes    Ambulation/Gait Assistance 6: Modified independent (Device/Increase time)    Ambulation/Gait Assistance Details ambulation throughout therapy session, mod I    Assistive device None    Gait Pattern Step-through pattern;Decreased stance time - left;Decreased step length - right    Ambulation Surface Level;Indoor    Gait velocity 10.19 secs = 3.2 ft/sec    Gait Comments Completed gait readmill x 9 min starting at 1.5 mph to with verbal cues to increase right step length to ride left leg back further. At minute 2, added 5% incline with continued work on step length, completed. Then at minute 7, reduced to 0% incline and increased speed to 1.7 mph. Patient tolerating well.      Neuro Re-ed    Neuro Re-ed Details  SLS on LLE: 12.47 seconds, no UE support      Exercises   Exercises Other Exercises    Other Exercises  Reviewed and updated HEP for patient upon d/c, see medbridge program:             Updated HEP and educated on return to gym:  Access Code: [email or web address] URL: [email or web address] Date: 07/31/2021 Prepared by: Baldomero Lamy  Exercises Standing Toe Taps - 1 x daily - 5 x weekly - 2 sets - 10 reps Staggered Sit-to-Stand - 1 x daily - 5 x weekly - 2 sets - 10 reps Standing Gastroc Stretch on Step - 1 x daily - 5 x weekly - 1 sets - 3 reps - 30 seconds hold Single Leg Stance - 1 x daily - 5 x weekly - 1 sets - 5 reps - 10-15 seconds hold Sidestepping - 1 x daily - 5 x weekly - 1 sets - 5 reps        PT Education - 07/31/21 1359     Education Details progress toward goals; continue HEP/walking program; d/c    Person(s) Educated Patient    Methods Explanation    Comprehension Verbalized understanding              PT Short Term Goals - 07/17/21 1801       PT SHORT TERM GOAL #1   Title = LTGs               PT  Long Term Goals - 07/31/21 1403       PT LONG TERM GOAL #1   Title Patient will be independent with Final HEP for ROM/Strengthening/Balance (All LTGs Due: 08/07/21)    Baseline  reports independence with curernt HEP. Will continue to benefit from progressive and final HEP prior to d/c; reports independence with final HEP    Time 3    Period Weeks    Status Achieved      PT LONG TERM GOAL #2   Title Patient will improve gait speed to >/= 3.0 ft/sec without AD and demo equal step length to demonstrate imrpoved household/community mobility and normalized gait pattern.    Baseline 0.78 ft/sec; 1.3 ft/sec; 2.79 ft/sec; 2.98 ft/sec; 3.2 ft/sec    Time 3    Period Weeks    Status Achieved      PT LONG TERM GOAL #3   Title Patient will improve DF to >/= neutral (0 deg) and PF to >/= 25 degs    Baseline DF -5 deg, PF 14 deg; DF - 4 deg, PF: 18; DF -4, PF: 18-19; DF -2, PF: 20 degs    Time 3    Period Weeks    Status Not Met      PT LONG TERM GOAL #5   Title Patient will report completion of daily walking program >/= 25 minutes for improved endurance and activity tolerance with no reports of pain    Baseline 10 minutes; 15 minutes at a time, continue to walk 15 minutes consitently, but is having spurts where ambulating >25 minutes with mild pain    Time 3    Period Weeks    Status Partially Met      PT LONG TERM GOAL #6   Title Patient will be able to hold SLS for >/= 15 seconds on LLE to demo improved stability/balance    Baseline 5-6 seconds; continued to be around 6 seconds on LLE; 9.75 secs on LLE; 12.48 seconds on LLE    Time 3    Period Weeks    Status Partially Met                   Plan - 07/31/21 1609     Clinical Impression Statement Assessed patient's progress toawrd LTG for D/C. patient has made significant progress with PT services. Paitent has showed ability to meet LTG #1, 2, 5 and 6. Has made progress toward all other goals, but not to goal level. Still  demosntating mild ROM limitations in L ankle. But has demo improved stability with SLS, and improved gait speed. Patient is able to ambulate at 3.2 ft/sec demo improved community mmobility. Updated HEP and educating on gym participation for continued improvements upon d/c. Patient verbalize agreement to d/c today.    Personal Factors and Comorbidities Comorbidity 1    Comorbidities HTN    Examination-Activity Limitations Bathing;Stairs;Stand;Transfers;Locomotion Level;Dressing;Squat;Hygiene/Grooming    Examination-Participation Restrictions Driving;Community Activity;Occupation    Stability/Clinical Decision Making Stable/Uncomplicated    Rehab Potential Good    PT Frequency 1x / week    PT Duration 3 weeks    PT Treatment/Interventions ADLs/Self Care Home Management;Cryotherapy;Electrical Stimulation;Moist Heat;DME Instruction;Gait training;Stair training;Functional mobility training;Therapeutic activities;Therapeutic exercise;Balance training;Neuromuscular re-education;Cognitive remediation;Patient/family education;Manual techniques;Passive range of motion;Dry needling;Taping;Joint Manipulations    PT Next Visit Plan d/c this visit    Consulted and Agree with Plan of Care Patient;Family member/caregiver             Patient will benefit from skilled therapeutic intervention in order to improve the following deficits and impairments:  Abnormal gait, Decreased activity tolerance, Decreased endurance, Decreased knowledge of use of DME, Decreased range of motion, Decreased strength, Impaired sensation, Pain, Difficulty walking, Decreased balance  Visit Diagnosis: Other abnormalities of gait and mobility  Muscle weakness (generalized)  Stiffness of left ankle, not elsewhere classified  Difficulty in walking, not elsewhere classified     Problem List Patient Active Problem List   Diagnosis Date Noted   Hot flashes 04/25/2021   Amenorrhea 04/25/2021   Vitamin D deficiency 03/14/2021    Hyperlipidemia 03/14/2021   Closed fracture of right wrist 02/27/2021   Thrombocytosis    Closed fracture of left distal tibia 01/11/2021   Tibia/fibula fracture 01/03/2021   Closed left pilon fracture 01/03/2021   Closed fracture of right distal radius 01/03/2021   Closed disp fracture of base of second metacarpal bone of right hand 01/03/2021   Anemia 02/13/2015   Essential hypertension 02/13/2015   Psoriasis of scalp 06/22/2012   Preventative health care 06/20/2011   ANOSMIA 03/09/2010   Dysfunctional uterine bleeding 03/15/2008   CONTACT DERMATITIS&OTH ECZEMA DUE OTH [facility] AGENT 03/15/2008    Jones Bales, PT, DPT 07/31/2021, 4:12 PM  [facility] [address] [place], [place], [address] Phone: [phone]   Fax:  [phone]  Name: Joanna Wright MRN: [id number] Date of Birth: 1971/07/11

## 2022-03-15 ENCOUNTER — Encounter: Payer: No Typology Code available for payment source | Admitting: Family

## 2022-03-18 ENCOUNTER — Ambulatory Visit (INDEPENDENT_AMBULATORY_CARE_PROVIDER_SITE_OTHER): Payer: PRIVATE HEALTH INSURANCE | Admitting: Family

## 2022-03-18 ENCOUNTER — Encounter: Payer: Self-pay | Admitting: Family

## 2022-03-18 ENCOUNTER — Other Ambulatory Visit (HOSPITAL_COMMUNITY)
Admission: RE | Admit: 2022-03-18 | Discharge: 2022-03-18 | Disposition: A | Discharge status: Home / Self Care | Payer: PRIVATE HEALTH INSURANCE | Source: Ambulatory Visit | Attending: Family | Admitting: Family

## 2022-03-18 ENCOUNTER — Other Ambulatory Visit (HOSPITAL_BASED_OUTPATIENT_CLINIC_OR_DEPARTMENT_OTHER): Payer: Self-pay

## 2022-03-18 VITALS — BP 128/81 | HR 92 | Temp 98.6°F | Resp 16 | Ht 63.0 in | Wt 196.0 lb

## 2022-03-18 DIAGNOSIS — Z01419 Encounter for gynecological examination (general) (routine) without abnormal findings: Secondary | ICD-10-CM

## 2022-03-18 DIAGNOSIS — E010 Iodine-deficiency related diffuse (endemic) goiter: Secondary | ICD-10-CM

## 2022-03-18 DIAGNOSIS — R21 Rash and other nonspecific skin eruption: Secondary | ICD-10-CM | POA: Insufficient documentation

## 2022-03-18 DIAGNOSIS — E785 Hyperlipidemia, unspecified: Secondary | ICD-10-CM | POA: Diagnosis not present

## 2022-03-18 DIAGNOSIS — Z0000 Encounter for general adult medical examination without abnormal findings: Secondary | ICD-10-CM | POA: Diagnosis not present

## 2022-03-18 DIAGNOSIS — I10 Essential (primary) hypertension: Secondary | ICD-10-CM

## 2022-03-18 LAB — COMPREHENSIVE METABOLIC PANEL
ALT: 8 U/L (ref 0–35)
AST: 11 U/L (ref 0–37)
Albumin: 4.2 g/dL (ref 3.5–5.2)
Alkaline Phosphatase: 77 U/L (ref 39–117)
BUN: 10 mg/dL (ref 6–23)
CO2: 25 mEq/L (ref 19–32)
Calcium: 8.8 mg/dL (ref 8.4–10.5)
Chloride: 104 mEq/L (ref 96–112)
Creatinine, Ser: 0.71 mg/dL (ref 0.40–1.20)
GFR: 98.76 mL/min (ref >60.00)
Glucose, Bld: 81 mg/dL (ref 70–99)
Potassium: 3.6 mEq/L (ref 3.5–5.1)
Sodium: 139 mEq/L (ref 135–145)
Total Bilirubin: 0.5 mg/dL (ref 0.2–1.2)
Total Protein: 6.5 g/dL (ref 6.0–8.3)

## 2022-03-18 LAB — TSH: TSH: 1.79 u[IU]/mL (ref 0.35–5.50)

## 2022-03-18 LAB — LIPID PANEL
Cholesterol: 199 mg/dL (ref 0–200)
HDL: 74.3 mg/dL (ref >39.00)
LDL Cholesterol: 116 mg/dL — ABNORMAL HIGH (ref 0–99)
NonHDL: 124.53
Total CHOL/HDL Ratio: 3
Triglycerides: 42 mg/dL (ref 0.0–149.0)
VLDL: 8.4 mg/dL (ref 0.0–40.0)

## 2022-03-18 LAB — T3, FREE: T3, Free: 3.3 pg/mL (ref 2.3–4.2)

## 2022-03-18 LAB — T4, FREE: Free T4: 0.89 ng/dL (ref 0.60–1.60)

## 2022-03-18 MED ORDER — BETAMETHASONE DIPROPIONATE 0.05 % EX CREA
TOPICAL_CREAM | Freq: Two times a day (BID) | CUTANEOUS | 1 refills | Status: DC
  Filled 2022-03-18: qty 30, 15d supply, fill #0

## 2022-03-18 NOTE — Assessment & Plan Note (Signed)
Discussed diet.  Obtain follow up lipid panel.

## 2022-03-18 NOTE — Assessment & Plan Note (Signed)
 BP at goal. Continue amlodipine 10 mg.

## 2022-03-18 NOTE — Assessment & Plan Note (Signed)
Discussed healthy diet, exercise and weight loss. Pap performed today. Obtain follow up labs as ordered. Mammo up to date.

## 2022-03-19 ENCOUNTER — Telehealth: Payer: Self-pay | Admitting: Family

## 2022-03-19 LAB — CYTOLOGY - PAP
Adequacy: ABSENT
Comment: NEGATIVE
Diagnosis: NEGATIVE
High risk HPV: NEGATIVE

## 2022-03-19 MED ORDER — METRONIDAZOLE 0.75 % VA GEL: 1.0000 | Freq: Every day | VAGINAL | 0 refills | Status: AC

## 2022-03-20 ENCOUNTER — Encounter: Payer: Self-pay | Admitting: Family

## 2022-03-20 ENCOUNTER — Ambulatory Visit (HOSPITAL_BASED_OUTPATIENT_CLINIC_OR_DEPARTMENT_OTHER)
Admission: RE | Admit: 2022-03-20 | Discharge: 2022-03-20 | Disposition: A | Discharge status: Home / Self Care | Payer: PRIVATE HEALTH INSURANCE | Source: Ambulatory Visit | Attending: Family | Admitting: Family

## 2022-03-20 DIAGNOSIS — E010 Iodine-deficiency related diffuse (endemic) goiter: Secondary | ICD-10-CM | POA: Diagnosis present

## 2022-03-20 NOTE — Telephone Encounter (Signed)
Pt aware.

## 2022-03-23 ENCOUNTER — Other Ambulatory Visit: Payer: Self-pay | Admitting: Family

## 2022-04-03 ENCOUNTER — Ambulatory Visit: Payer: PRIVATE HEALTH INSURANCE | Admitting: Family

## 2022-04-03 VITALS — BP 141/83 | HR 82 | Temp 98.7°F | Resp 16 | Wt 198.0 lb

## 2022-04-03 DIAGNOSIS — K4020 Bilateral inguinal hernia, without obstruction or gangrene, not specified as recurrent: Secondary | ICD-10-CM

## 2022-04-03 NOTE — Progress Notes (Signed)
Subjective:   By signing my name below, I, Kellie Simmering, attest that this documentation has been prepared under the direction and in the presence of [name], NP 04/03/2022      Patient ID: Joanna Wright, female    DOB: 14-Oct-1970, 51 y.o.   MRN: [id number]  Chief Complaint  Patient presents with   Inguinal Hernia    Patient reports right inguinal hernia    HPI Patient is in today for an office visit.  Inguinal Hernia- She complains of pain stemming from her hernia. She reports that when other people apply pressure to, it causes unbearable pain. However, when she applies pressure herself to the hernia, the pain is bearable. She states that she has had this hernia since her childhood. Mother told her that when she was a baby and would cry "they would pop out on both sides."    Past Medical History:  Diagnosis Date   DUB (dysfunctional uterine bleeding)    Hx   Hypertension    MHA (microangiopathic hemolytic anemia) (HCC)    Hx   Night sweats    Hx   Psoriasis of scalp     Past Surgical History:  Procedure Laterality Date   ACHILLES TENDON REPAIR Right 2010   BREAST BIOPSY Right 04/06/2019   CERVICAL POLYPECTOMY N/A 05/19/2014   Procedure: CERVICAL POLYPECTOMY;  Surgeon: Sharene Butters, MD;  Location: [facility];  Service: Gynecology;  Laterality: N/A;   EXTERNAL FIXATION LEG Left 01/03/2021   Procedure: EXTERNAL FIXATION ANKLE LEFT and right distal radius fracture ORIF;  Surgeon: Shona Needles, MD;  Location: [place];  Service: Orthopedics;  Laterality: Left;   EXTERNAL FIXATION REMOVAL Left 01/08/2021   Procedure: REMOVAL EXTERNAL FIXATION LEG;  Surgeon: Shona Needles, MD;  Location: [facility];  Service: Orthopedics;  Laterality: Left;   HARDWARE REMOVAL Right 03/02/2021   Procedure: HARDWARE REMOVAL RIGHT ARM AND MANIPULATION OF LEFT ANKLE;  Surgeon: Shona Needles, MD;  Location: [place];  Service: Orthopedics;  Laterality: Right;   HYSTEROSCOPY N/A 05/19/2014    Procedure: HYSTEROSCOPY;  Surgeon: Sharene Butters, MD;  Location: [facility];  Service: Gynecology;  Laterality: N/A;   OPEN REDUCTION INTERNAL FIXATION (ORIF) TIBIA/FIBULA FRACTURE Left 01/08/2021   Procedure: OPEN REDUCTION INTERNAL FIXATION (ORIF) PILON FRACTURE;  Surgeon: Shona Needles, MD;  Location: [place];  Service: Orthopedics;  Laterality: Left;   WISDOM TOOTH EXTRACTION      Family History  Problem Relation Age of Onset   Hypertension Mother        had "bleeding on the brain"   Glaucoma Father    Blindness Father    Peripheral vascular disease Father    Cancer Maternal Grandfather        unsure what type   Blindness Paternal Grandfather    Hypertension Other    Kidney disease Neg Hx    Diabetes Neg Hx    Colon cancer Neg Hx    Colon polyps Neg Hx    Esophageal cancer Neg Hx    Rectal cancer Neg Hx    Stomach cancer Neg Hx     Social History   Socioeconomic History   Marital status: Married    Spouse name: Not on file   Number of children: Not on file   Years of education: Not on file   Highest education level: Not on file  Occupational History   Not on file  Tobacco Use   Smoking status: Never  Smokeless tobacco: Never  Vaping Use   Vaping Use: Never used  Substance and Sexual Activity   Alcohol use: Not Currently    Alcohol/week: 0.0 standard drinks of alcohol    Comment: 1 drink per month   Drug use: No   Sexual activity: Yes    Partners: Male    Birth control/protection: Condom  Other Topics Concern   Not on file  Social History Narrative   Recently remarried   No children   Works in Therapist, art, [employer]   Completed [place] up in [employer]   Enjoys sleeping and shopping   Has a pitbull   Social Determinants of Health   Financial Resource Strain: Not on file  Food Insecurity: Not on file  Transportation Needs: Not on file  Physical Activity: Not on file  Stress: Not on file  Social Connections: Not on file  Intimate  Partner Violence: Not on file    Outpatient Medications Prior to Visit  Medication Sig Dispense Refill   amLODipine (NORVASC) 10 MG tablet Take 1 tablet (10 mg total) by mouth daily. 90 tablet 1   betamethasone dipropionate 0.05 % cream Apply topically 2 (two) times daily. 30 g 1   norethindrone (MICRONOR) 0.35 MG tablet TAKE 1 TABLET BY MOUTH EVERY DAY 84 tablet 1   No facility-administered medications prior to visit.    No Known Allergies  ROS     Objective:    Physical Exam Constitutional:      Appearance: Normal appearance.  HENT:     Head: Normocephalic and atraumatic.     Right Ear: External ear normal.     Left Ear: External ear normal.  Eyes:     Extraocular Movements: Extraocular movements intact.     Pupils: Pupils are equal, round, and reactive to light.  Cardiovascular:     Rate and Rhythm: Normal rate.  Pulmonary:     Effort: Pulmonary effort is normal.  Abdominal:     Hernia: A hernia (small right inguinal hernia, tender, non-reducible) is present.  Skin:    General: Skin is warm and dry.  Neurological:     Mental Status: She is alert and oriented to person, place, and time.  Psychiatric:        Mood and Affect: Mood normal.        Behavior: Behavior normal.        Judgment: Judgment normal.     BP (!) 141/83 (BP Location: Left Arm, Patient Position: Sitting, Cuff Size: Large)   Pulse 82   Temp 98.7 F (37.1 C) (Oral)   Resp 16   Wt 198 lb (89.8 kg)   SpO2 100%   BMI 35.07 kg/m  Wt Readings from Last 3 Encounters:  04/03/22 198 lb (89.8 kg)  03/18/22 196 lb (88.9 kg)  07/16/21 186 lb (84.4 kg)       Assessment & Plan:   Problem List Items Addressed This Visit       Unprioritized   Non-recurrent bilateral inguinal hernia without obstruction or gangrene - Primary    This is becoming painful/bothersome for the patient. Will refer to surgeon for consultation. We discussed signs/symptoms of strangulated hernia and she is advised to go to  the ED if this occurs.       Relevant Orders   Ambulatory referral to General Surgery    No orders of the defined types were placed in this encounter.   I, Nance Pear, NP, personally preformed the  services described in this documentation.  All medical record entries made by the scribe were at my direction and in my presence.  I have reviewed the chart and discharge instructions (if applicable) and agree that the record reflects my personal performance and is accurate and complete.    I,Mohammed Iqbal,acting as a Education administrator for [employer], NP.,have documented all relevant documentation on the behalf of Nance Pear, NP,as directed by  Nance Pear, NP while in the presence of Nance Pear, NP.      Nance Pear, NP

## 2022-04-03 NOTE — Assessment & Plan Note (Signed)
This is becoming painful/bothersome for the patient. Will refer to surgeon for consultation. We discussed signs/symptoms of strangulated hernia and she is advised to go to the ED if this occurs.

## 2022-06-23 ENCOUNTER — Other Ambulatory Visit: Payer: Self-pay | Admitting: Family

## 2022-06-24 ENCOUNTER — Ambulatory Visit (HOSPITAL_BASED_OUTPATIENT_CLINIC_OR_DEPARTMENT_OTHER)
Admission: RE | Admit: 2022-06-24 | Discharge: 2022-06-24 | Disposition: A | Discharge status: Home / Self Care | Payer: PRIVATE HEALTH INSURANCE | Source: Ambulatory Visit | Attending: Family | Admitting: Family

## 2022-06-24 ENCOUNTER — Encounter (HOSPITAL_BASED_OUTPATIENT_CLINIC_OR_DEPARTMENT_OTHER): Payer: Self-pay

## 2022-06-24 DIAGNOSIS — Z1231 Encounter for screening mammogram for malignant neoplasm of breast: Secondary | ICD-10-CM | POA: Insufficient documentation

## 2022-06-24 DIAGNOSIS — Z0000 Encounter for general adult medical examination without abnormal findings: Secondary | ICD-10-CM

## 2022-09-06 ENCOUNTER — Other Ambulatory Visit: Payer: Self-pay | Admitting: Family

## 2022-12-16 ENCOUNTER — Encounter: Payer: Self-pay | Admitting: Family

## 2022-12-16 ENCOUNTER — Ambulatory Visit (INDEPENDENT_AMBULATORY_CARE_PROVIDER_SITE_OTHER): Payer: PRIVATE HEALTH INSURANCE | Admitting: Family

## 2022-12-16 VITALS — BP 136/84 | HR 84 | Temp 99.2°F | Resp 16 | Wt 200.0 lb

## 2022-12-16 DIAGNOSIS — E785 Hyperlipidemia, unspecified: Secondary | ICD-10-CM | POA: Diagnosis not present

## 2022-12-16 DIAGNOSIS — R011 Cardiac murmur, unspecified: Secondary | ICD-10-CM

## 2022-12-16 DIAGNOSIS — E559 Vitamin D deficiency, unspecified: Secondary | ICD-10-CM | POA: Diagnosis not present

## 2022-12-16 DIAGNOSIS — Z0001 Encounter for general adult medical examination with abnormal findings: Secondary | ICD-10-CM

## 2022-12-16 DIAGNOSIS — I10 Essential (primary) hypertension: Secondary | ICD-10-CM

## 2022-12-16 DIAGNOSIS — Z0000 Encounter for general adult medical examination without abnormal findings: Secondary | ICD-10-CM

## 2022-12-16 DIAGNOSIS — R232 Flushing: Secondary | ICD-10-CM

## 2022-12-16 DIAGNOSIS — M722 Plantar fascial fibromatosis: Secondary | ICD-10-CM

## 2022-12-16 LAB — COMPREHENSIVE METABOLIC PANEL
ALT: 14 U/L (ref 0–35)
AST: 14 U/L (ref 0–37)
Albumin: 4.9 g/dL (ref 3.5–5.2)
Alkaline Phosphatase: 95 U/L (ref 39–117)
BUN: 12 mg/dL (ref 6–23)
CO2: 25 mEq/L (ref 19–32)
Calcium: 9.9 mg/dL (ref 8.4–10.5)
Chloride: 103 mEq/L (ref 96–112)
Creatinine, Ser: 0.84 mg/dL (ref 0.40–1.20)
GFR: 80.29 mL/min (ref >60.00)
Glucose, Bld: 89 mg/dL (ref 70–99)
Potassium: 3.5 mEq/L (ref 3.5–5.1)
Sodium: 140 mEq/L (ref 135–145)
Total Bilirubin: 0.6 mg/dL (ref 0.2–1.2)
Total Protein: 8 g/dL (ref 6.0–8.3)

## 2022-12-16 LAB — LIPID PANEL
Cholesterol: 234 mg/dL — ABNORMAL HIGH (ref 0–200)
HDL: 86.4 mg/dL (ref >39.00)
LDL Cholesterol: 137 mg/dL — ABNORMAL HIGH (ref 0–99)
NonHDL: 147.39
Total CHOL/HDL Ratio: 3
Triglycerides: 53 mg/dL (ref 0.0–149.0)
VLDL: 10.6 mg/dL (ref 0.0–40.0)

## 2022-12-16 MED ORDER — AMLODIPINE BESYLATE 10 MG PO TABS
10.0000 mg | ORAL_TABLET | Freq: Every day | ORAL | 1 refills | Status: DC
  Filled 2022-12-30: qty 90, 90d supply, fill #0
  Filled 2023-03-30: qty 90, 90d supply, fill #1

## 2022-12-16 MED ORDER — NORETHINDRONE 0.35 MG PO TABS: 1.0000 | ORAL_TABLET | Freq: Every day | ORAL | 1 refills | Status: DC

## 2022-12-16 NOTE — Assessment & Plan Note (Signed)
New.  Not bothering her. Reassurance provided. If it begins to bother her will plan referral to podiatry.

## 2022-12-16 NOTE — Progress Notes (Signed)
Subjective:   By signing my name below, I, Joanna Wright, attest that this documentation has been prepared under the direction and in the presence of Joanna Alar, NP. 12/16/2022   Patient ID: Joanna Wright, female    DOB: 14-Apr-1971, 52 y.o.   MRN: [id number]  Chief Complaint  Patient presents with   Annual Exam    HPI Patient is in today for a comprehensive physical exam.   Hernia: Her hernia has not bothered her recently so she is not interested in seeing a surgeon at this time.   Skin: She reports a small mass on her right foot. It is not painful nor bothers her in her daily life.   Hot flashes/night sweats: She complains of hot flashes and night sweats. Her last menstrual cycle was December 2023. She continues taking 0.35 mg norethindrone daily PO. She is interested in seeing a GYN specialist to manage her symptoms.   Blood pressure: Her blood pressure is doing well during this visit. She continues taking 10 g amlodipine daily PO.  BP Readings from Last 3 Encounters:  12/16/22 136/84  04/03/22 (!) 141/83  03/18/22 128/81   Pulse Readings from Last 3 Encounters:  12/16/22 84  04/03/22 82  03/18/22 92   Birth control: She continues taking 0.35 mg norethindrone daily PO. Her last menstrual cycle was in December 2023.  Acute: She denies fever, unexpected weight change, adenopathy, new moles, sinus pain, sore throat, visual disturbance, chest pain, palpitations, leg swelling, cough, shortness of breath, wheezing, nausea, vomiting, diarrhea, constipation, blood in stool, dysuria, frequency, hematuria, new muscle pain, new joint pain, headaches, depression or anxiety at this time.   Social history: She has no changes to her family medical history. Her mother passed away and her father is living.   Immunizations: She is UTD on the latest Covid-19 vaccine. She is UTD on both shingles vaccines.   Diet: She is managing a healthy diet.   Exercise: She is participating in  regular exercise.   Colonoscopy: Last completed 05/25/2021. Results showed one 2 mm polyp at the splenic flexure which was removed, otherwise results are normal.   Pap Smear: Last completed 03/18/2022. Results are normal. Repeat in 3 years.   Mammogram: Last completed 06/24/2022. Results are normal. Repeat in 1 year.   Dental: She is UTD on dental care.   Vision: She is UTD on vision care.    Past Medical History:  Diagnosis Date   DUB (dysfunctional uterine bleeding)    Hx   Hypertension    MHA (microangiopathic hemolytic anemia) (HCC)    Hx   Night sweats    Hx   Psoriasis of scalp     Past Surgical History:  Procedure Laterality Date   ACHILLES TENDON REPAIR Right 2010   BREAST BIOPSY Right 04/06/2019   CERVICAL POLYPECTOMY N/A 05/19/2014   Procedure: CERVICAL POLYPECTOMY;  Surgeon: Sharene Butters, MD;  Location: [facility];  Service: Gynecology;  Laterality: N/A;   EXTERNAL FIXATION LEG Left 01/03/2021   Procedure: EXTERNAL FIXATION ANKLE LEFT and right distal radius fracture ORIF;  Surgeon: Shona Needles, MD;  Location: [place];  Service: Orthopedics;  Laterality: Left;   EXTERNAL FIXATION REMOVAL Left 01/08/2021   Procedure: REMOVAL EXTERNAL FIXATION LEG;  Surgeon: Shona Needles, MD;  Location: [place];  Service: Orthopedics;  Laterality: Left;   HARDWARE REMOVAL Right 03/02/2021   Procedure: HARDWARE REMOVAL RIGHT ARM AND MANIPULATION OF LEFT ANKLE;  Surgeon: Shona Needles, MD;  Location: [place];  Service: Orthopedics;  Laterality: Right;   HYSTEROSCOPY N/A 05/19/2014   Procedure: HYSTEROSCOPY;  Surgeon: Sharene Butters, MD;  Location: [facility];  Service: Gynecology;  Laterality: N/A;   OPEN REDUCTION INTERNAL FIXATION (ORIF) TIBIA/FIBULA FRACTURE Left 01/08/2021   Procedure: OPEN REDUCTION INTERNAL FIXATION (ORIF) PILON FRACTURE;  Surgeon: Shona Needles, MD;  Location: [place];  Service: Orthopedics;  Laterality: Left;   WISDOM TOOTH EXTRACTION      Family History   Problem Relation Age of Onset   Hypertension Mother        had "bleeding on the brain"   Glaucoma Father    Blindness Father    Peripheral vascular disease Father    Cancer Maternal Grandfather        unsure what type   Blindness Paternal Grandfather    Hypertension Other    Kidney disease Neg Hx    Diabetes Neg Hx    Colon cancer Neg Hx    Colon polyps Neg Hx    Esophageal cancer Neg Hx    Rectal cancer Neg Hx    Stomach cancer Neg Hx     Social History   Socioeconomic History   Marital status: Married    Spouse name: Not on file   Number of children: Not on file   Years of education: Not on file   Highest education level: Not on file  Occupational History   Not on file  Tobacco Use   Smoking status: Never   Smokeless tobacco: Never  Vaping Use   Vaping Use: Never used  Substance and Sexual Activity   Alcohol use: Not Currently    Alcohol/week: 0.0 standard drinks of alcohol    Comment: 1 drink per month   Drug use: No   Sexual activity: Yes    Partners: Male    Birth control/protection: Condom  Other Topics Concern   Not on file  Social History Narrative   Recently remarried   No children   Works in Therapist, art, [employer]   Completed HS   Grew up in [place]   Enjoys sleeping and shopping   Has a pitbull   Social Determinants of Health   Financial Resource Strain: Not on file  Food Insecurity: Not on file  Transportation Needs: Not on file  Physical Activity: Not on file  Stress: Not on file  Social Connections: Not on file  Intimate Partner Violence: Not on file    Outpatient Medications Prior to Visit  Medication Sig Dispense Refill   amLODipine (NORVASC) 10 MG tablet TAKE 1 TABLET BY MOUTH EVERY DAY 90 tablet 1   betamethasone dipropionate 0.05 % cream Apply topically 2 (two) times daily. 30 g 1   norethindrone (MICRONOR) 0.35 MG tablet Take 1 tablet (0.35 mg total) by mouth daily. 84 tablet 1   No facility-administered  medications prior to visit.    No Known Allergies  Review of Systems  Constitutional:  Negative for fever.       (-)unexpected weight change (-)Adenopathy  HENT:  Negative for congestion, sinus pain and sore throat.   Eyes:        (-)Visual disturbance  Respiratory:  Negative for cough, shortness of breath and wheezing.   Cardiovascular:  Negative for chest pain, palpitations and leg swelling.  Gastrointestinal:  Negative for blood in stool, constipation, diarrhea, nausea and vomiting.  Genitourinary:  Negative for dysuria, frequency and hematuria.  Musculoskeletal:        (-)new  muscle pain (-)new joint pain  Skin:        (-)new moles (+)small, non-tender mass on right foot  Neurological:  Negative for dizziness and headaches.  Psychiatric/Behavioral:  Negative for depression. The patient is not nervous/anxious.        Objective:    Physical Exam Constitutional:      General: She is not in acute distress.    Appearance: Normal appearance. She is not ill-appearing.  HENT:     Head: Normocephalic and atraumatic.     Right Ear: Tympanic membrane, ear canal and external ear normal.     Left Ear: Tympanic membrane, ear canal and external ear normal.  Eyes:     Extraocular Movements: Extraocular movements intact.     Right eye: No nystagmus.     Left eye: No nystagmus.     Pupils: Pupils are equal, round, and reactive to light.  Neck:     Thyroid: No thyroid tenderness.  Cardiovascular:     Rate and Rhythm: Normal rate and regular rhythm.     Heart sounds: Murmur heard.     Systolic murmur is present with a grade of 1/6.     No gallop.  Pulmonary:     Effort: Pulmonary effort is normal. No respiratory distress.     Breath sounds: Normal breath sounds. No wheezing or rales.  Abdominal:     General: There is no distension.     Palpations: Abdomen is soft.     Tenderness: There is no abdominal tenderness. There is no guarding.  Musculoskeletal:     Comments: 5/5  strength in both upper and lower extremities  Lymphadenopathy:     Cervical: No cervical adenopathy.  Skin:    General: Skin is warm and dry.     Comments: Pea size mass right plantar surface; non-tender   Neurological:     Mental Status: She is alert and oriented to person, place, and time.     Deep Tendon Reflexes:     Reflex Scores:      Patellar reflexes are 2+ on the right side and 2+ on the left side. Psychiatric:        Judgment: Judgment normal.     BP 136/84 (BP Location: Left Arm, Patient Position: Sitting, Cuff Size: Large)   Pulse 84   Temp 99.2 F (37.3 C) (Oral)   Resp 16   Wt 200 lb (90.7 kg)   LMP 05/16/2021   SpO2 100%   BMI 35.43 kg/m  Wt Readings from Last 3 Encounters:  12/16/22 200 lb (90.7 kg)  04/03/22 198 lb (89.8 kg)  03/18/22 196 lb (88.9 kg)       Assessment & Plan:  Essential hypertension Assessment & Plan: BP at goal, continue amlodipine 10mg .    Orders: -     Comprehensive metabolic panel  Murmur Assessment & Plan: New. Asymptomatic. Obtain 2D echo for further evaluation.   Orders: -     ECHOCARDIOGRAM COMPLETE; Future  Hot flashes Assessment & Plan: New. She is maintained on norethindrone.  Does not likely need any longer for birth control. Recommended that she meet with GYN to discuss HRT options.   Orders: -     Ambulatory referral to Obstetrics / Gynecology  Hyperlipidemia, unspecified hyperlipidemia type -     Lipid panel  Vitamin D deficiency -     VITAMIN D 25 Hydroxy (Vit-D Deficiency, Fractures)  Encounter for general adult medical examination with abnormal findings Assessment & Plan: Wt Readings  from Last 3 Encounters:  12/16/22 200 lb (90.7 kg)  04/03/22 198 lb (89.8 kg)  03/18/22 196 lb (88.9 kg)   Continue healthy diet and exercise.  Mammo/pap/colo up to date. Immunizations reviewed and up to date.    Plantar fascial fibromatosis of right foot Assessment & Plan: New.  Not bothering her. Reassurance  provided. If it begins to bother her will plan referral to podiatry.    Other orders -     Norethindrone; Take 1 tablet (0.35 mg total) by mouth daily.  Dispense: 84 tablet; Refill: 1 -     amLODIPine Besylate; Take 1 tablet (10 mg total) by mouth daily.  Dispense: 90 tablet; Refill: 1    I, Nance Pear, NP, personally preformed the services described in this documentation.  All medical record entries made by the scribe were at my direction and in my presence.  I have reviewed the chart and discharge instructions (if applicable) and agree that the record reflects my personal performance and is accurate and complete. 12/16/2022   I,Joanna Wright,acting as a Education administrator for Nance Pear, NP.,have documented all relevant documentation on the behalf of Nance Pear, NP,as directed by  Nance Pear, NP while in the presence of Nance Pear, NP.   Nance Pear, NP

## 2022-12-16 NOTE — Assessment & Plan Note (Signed)
New. She is maintained on norethindrone.  Does not likely need any longer for birth control. Recommended that she meet with GYN to discuss HRT options.

## 2022-12-16 NOTE — Assessment & Plan Note (Signed)
Wt Readings from Last 3 Encounters:  12/16/22 200 lb (90.7 kg)  04/03/22 198 lb (89.8 kg)  03/18/22 196 lb (88.9 kg)   Continue healthy diet and exercise.  Mammo/pap/colo up to date. Immunizations reviewed and up to date.

## 2022-12-16 NOTE — Assessment & Plan Note (Signed)
BP at goal, continue amlodipine 10mg .

## 2022-12-16 NOTE — Assessment & Plan Note (Signed)
New. Asymptomatic. Obtain 2D echo for further evaluation.

## 2022-12-17 ENCOUNTER — Telehealth: Payer: Self-pay | Admitting: Family

## 2022-12-17 DIAGNOSIS — E559 Vitamin D deficiency, unspecified: Secondary | ICD-10-CM

## 2022-12-17 LAB — VITAMIN D 25 HYDROXY (VIT D DEFICIENCY, FRACTURES): VITD: 9.93 ng/mL — ABNORMAL LOW (ref 30.00–100.00)

## 2022-12-17 MED ORDER — VITAMIN D (ERGOCALCIFEROL) 1.25 MG (50000 UNIT) PO CAPS
50000.0000 [IU] | ORAL_CAPSULE | ORAL | 0 refills | Status: DC
  Filled 2022-12-30: qty 12, 84d supply, fill #0

## 2022-12-17 NOTE — Telephone Encounter (Signed)
Patient called back and she was notified of results, new rx for Vitamin D once a week. She will call back to set up appointment for D check in 12 weeks.

## 2022-12-17 NOTE — Telephone Encounter (Signed)
Called patient but no answer, left voice mail for patient to call back. '

## 2022-12-17 NOTE — Telephone Encounter (Signed)
Vitamin D level is low.  Advise patient to begin vit D 50000 units once weekly for 12 weeks, then repeat vit D level (dx Vit D deficiency).    Cholesterol is elevated. Please continue work on low fat/low cholesterol diet/exercise and weight loss.

## 2022-12-30 ENCOUNTER — Other Ambulatory Visit (HOSPITAL_BASED_OUTPATIENT_CLINIC_OR_DEPARTMENT_OTHER): Payer: Self-pay

## 2023-01-06 ENCOUNTER — Other Ambulatory Visit (HOSPITAL_BASED_OUTPATIENT_CLINIC_OR_DEPARTMENT_OTHER): Payer: Self-pay

## 2023-01-09 ENCOUNTER — Ambulatory Visit (HOSPITAL_BASED_OUTPATIENT_CLINIC_OR_DEPARTMENT_OTHER)
Admission: RE | Admit: 2023-01-09 | Discharge: 2023-01-09 | Disposition: A | Discharge status: Home / Self Care | Payer: PRIVATE HEALTH INSURANCE | Source: Ambulatory Visit | Attending: Family | Admitting: Family

## 2023-01-09 DIAGNOSIS — R011 Cardiac murmur, unspecified: Secondary | ICD-10-CM | POA: Diagnosis not present

## 2023-01-09 LAB — ECHOCARDIOGRAM COMPLETE
Area-P 1/2: 3.65 cm2
S' Lateral: 2.1 cm

## 2023-01-14 ENCOUNTER — Other Ambulatory Visit (HOSPITAL_BASED_OUTPATIENT_CLINIC_OR_DEPARTMENT_OTHER): Payer: Self-pay

## 2023-03-05 ENCOUNTER — Encounter: Payer: Self-pay | Admitting: Obstetrics & Gynecology

## 2023-03-05 ENCOUNTER — Ambulatory Visit: Payer: PRIVATE HEALTH INSURANCE | Admitting: Obstetrics & Gynecology

## 2023-03-05 ENCOUNTER — Other Ambulatory Visit (HOSPITAL_BASED_OUTPATIENT_CLINIC_OR_DEPARTMENT_OTHER): Payer: Self-pay

## 2023-03-05 VITALS — BP 131/75 | HR 99 | Ht 63.0 in | Wt 206.0 lb

## 2023-03-05 DIAGNOSIS — N951 Menopausal and female climacteric states: Secondary | ICD-10-CM

## 2023-03-05 MED ORDER — NORETHINDRONE ACET-ETHINYL EST 1-20 MG-MCG PO TABS
1.0000 | ORAL_TABLET | Freq: Every day | ORAL | 11 refills | Status: DC
  Filled 2023-03-05: qty 21, 21d supply, fill #0
  Filled 2023-03-30: qty 21, 21d supply, fill #1

## 2023-03-05 NOTE — Progress Notes (Signed)
History:  52 y.o. G1P0 here today for eval of hot flushes. Last PAP 03/18/2022. Mammogram 06/24/2022. LMP: oligomenorrhea;  not greater than 1 year.   The following portions of the patient's history were reviewed and updated as appropriate: allergies, current medications, past family history, past medical history, past social history, past surgical history and problem list.  Review of Systems:  Pertinent items are noted in HPI.    Objective:  Physical Exam Blood pressure 131/75, pulse 99, height 5\' 3"  (1.6 m), weight 206 lb (93.4 kg), last menstrual period 05/16/2021, not currently breastfeeding.  CONSTITUTIONAL: Well-developed, well-nourished female in no acute distress.  HENT:  Normocephalic, atraumatic EYES: Conjunctivae and EOM are normal. No scleral icterus.  NECK: Normal range of motion SKIN: Skin is warm and dry. No rash noted. Not diaphoretic.No pallor. NEUROLGIC: Alert and oriented to person, place, and time. Normal coordination.   Assessment & Plan:  Diagnoses and all orders for this visit:  Hot flashes due to menopause -     norethindrone-ethinyl estradiol (LOESTRIN 1/20, 21,) 1-20 MG-MCG tablet; Take 1 tablet by mouth daily.  Perimenopause -     norethindrone-ethinyl estradiol (LOESTRIN 1/20, 21,) 1-20 MG-MCG tablet; Take 1 tablet by mouth daily.   F/u in 3 months or sooner prn   Joanna Wright, M.D., [facility]

## 2023-03-05 NOTE — Progress Notes (Signed)
Patient has noticed an increase in hot flashes.  Last pap June 2023 Last mammogram Oct 2023 Armandina Stammer RN

## 2023-03-05 NOTE — Patient Instructions (Signed)
Oral Contraception Information Oral contraceptive pills (OCPs) are medicines taken by mouth to prevent pregnancy. They work by: Preventing the ovaries from releasing eggs. Thickening mucus in the lower part of the uterus (cervix). This prevents sperm from entering the uterus. Thinning the lining of the uterus (endometrium). This prevents a fertilized egg from attaching to the endometrium. OCPs are highly effective when taken exactly as prescribed. However, OCPs do not prevent STIs (sexually transmitted infections). Using condoms while on an OCP can help prevent STIs. What happens before starting OCPs? Before you start taking OCPs: You may have a physical exam, blood test, and Pap test. Your health care provider will make sure you are a good candidate for oral contraception. OCPs are not a good option for certain women, such as: Women who smoke and are older than age 35. Women who have or have had certain conditions, such as: A history of high blood pressure. Deep vein thrombosis. Pulmonary embolism. Stroke. Cardiovascular disease. Peripheral vascular disease. Ask your health care provider about the possible side effects of the OCP you may be prescribed. Be aware that it can take 2-3 months for your body to adjust to changes in hormone levels. Types of oral contraception  Birth control pills contain the hormones estrogen and progestin (synthetic progesterone) or progestin only. The combination pill This type of pill contains estrogen and progestin hormones. Conventional contraception pills come in packs of 21 or 28 pills. Some packs with 28-day pills contain estrogen and progestin for the first 21-24 days. Hormone-free tablets, called placebos, are taken for the final 4-7 days. You should have menstrual bleeding during the time you take the placebos. In packs with 21 tablets, you take no pills for 7 days. Menstrual bleeding occurs during these days. (Some people prefer taking a pill for 28  days to help establish a routine). Extended-interval contraception pills come in packs of 91 pills. The first 84 tablets have both estrogen and progestin. The last 7 pills are placebos. Menstrual bleeding occurs during the placebo days. With this schedule, menstrual bleeding happens once every 3 months. Continuous contraception pills come in packs of 28 pills. All pills in the pack contain estrogen and progestin. With this schedule, regular menstrual bleeding does not happen, but there may be spotting or irregular bleeding. Progestin-only pills This type of pill is often called the mini-pill and contains the progestin hormone only. It comes in packs of 28 pills. In some packs, the last 4 pills are placebos. The pill must be taken at the same time every day. This is very important to prevent pregnancy. Menstrual bleeding may not be regular or predictable. What are the advantages? Oral contraception provides reliable and continuous contraception if taken as directed. It may treat or decrease symptoms of: Menstrual period cramps. Irregular menstrual cycle or bleeding. Heavy menstrual flow. Abnormal uterine bleeding. Acne, depending on the type of pill. Polycystic ovarian syndrome (POS). Endometriosis. Iron deficiency anemia. Premenstrual symptoms, including severe irritability, depression, or anxiety. It also may: Reduce the risk of endometrial and ovarian cancer. Be used as emergency contraception. Prevent ectopic pregnancies and infections of the fallopian tubes. What can make OCPs less effective? OCPs may be less effective if: You forget to take the pill every day. For progestin-only pills, it is especially important to take the pill at the same time each day. Even taking it 3 hours late can increase the risk of pregnancy. You have a stomach or intestinal disease that reduces your body's ability to absorb the pill.   You take OCPs with other medicines that make OCPs less effective, such as  antibiotics, certain HIV medicines, and some seizure medicines. You take expired OCPs. You forget to restart the pill after 7 days of not taking it. This refers to the packs of 21 pills. What are the side effects and risks? OCPs can sometimes cause side effects, such as: Headache. Depression. Trouble sleeping. Nausea and vomiting. Breast tenderness. Irregular bleeding or spotting during the first several months. Bloating or fluid retention. Increase in blood pressure. Combination pills may slightly increase the risk of: Blood clots. Heart attack. Stroke. Follow these instructions at home: Follow instructions from your health care provider about how to start taking your first cycle of OCPs. Depending on when you start the pill, you may need to use a backup form of birth control, such as condoms, during the first week. Make sure you know what steps to take if you forget to take the pill. Summary Oral contraceptive pills (OCPs) are medicines taken by mouth to prevent pregnancy. They are highly effective when taken exactly as prescribed. OCPs contain a combination of the hormones estrogen and progestin (synthetic progesterone) or progestin only. Before you start taking the pill, you may have a physical exam, blood test, and Pap test. Your health care provider will make sure you are a good candidate for oral contraception. The combination pill may come in a 21-day pack, a 28-day pack, or a 91-day pack. Progestin-only pills come in packs of 28 pills. OCPs can sometimes cause side effects, such as headache, nausea, breast tenderness, or irregular bleeding. This information is not intended to replace advice given to you by your health care provider. Make sure you discuss any questions you have with your health care provider. Document Revised: 06/09/2020 Document Reviewed: 05/18/2020 Elsevier Patient Education  2024 Elsevier Inc.  

## 2023-03-18 ENCOUNTER — Encounter: Payer: Self-pay | Admitting: Obstetrics & Gynecology

## 2023-04-03 ENCOUNTER — Other Ambulatory Visit (HOSPITAL_BASED_OUTPATIENT_CLINIC_OR_DEPARTMENT_OTHER): Payer: Self-pay

## 2023-04-03 ENCOUNTER — Encounter: Payer: Self-pay | Admitting: Obstetrics & Gynecology

## 2023-04-07 ENCOUNTER — Encounter: Payer: Self-pay | Admitting: Family

## 2023-04-07 DIAGNOSIS — N951 Menopausal and female climacteric states: Secondary | ICD-10-CM

## 2023-04-09 NOTE — Addendum Note (Signed)
Addended by: Sandford Craze on: 04/09/2023 02:34 PM   Modules accepted: Orders

## 2023-04-10 ENCOUNTER — Other Ambulatory Visit: Payer: Self-pay | Admitting: Obstetrics & Gynecology

## 2023-04-10 DIAGNOSIS — N951 Menopausal and female climacteric states: Secondary | ICD-10-CM

## 2023-04-10 MED ORDER — NORETHINDRONE 0.35 MG PO TABS: 1.0000 | ORAL_TABLET | Freq: Every day | ORAL | 11 refills | Status: DC

## 2023-04-10 MED ORDER — PAROXETINE HCL 10 MG PO TABS: 10.0000 mg | ORAL_TABLET | Freq: Every day | ORAL | 3 refills | Status: DC

## 2023-05-08 ENCOUNTER — Encounter (INDEPENDENT_AMBULATORY_CARE_PROVIDER_SITE_OTHER): Payer: Self-pay

## 2023-06-11 ENCOUNTER — Ambulatory Visit: Payer: PRIVATE HEALTH INSURANCE | Admitting: Obstetrics & Gynecology

## 2023-06-18 ENCOUNTER — Ambulatory Visit: Payer: PRIVATE HEALTH INSURANCE | Admitting: Family

## 2023-06-24 ENCOUNTER — Ambulatory Visit: Payer: PRIVATE HEALTH INSURANCE | Admitting: Family

## 2023-06-24 VITALS — BP 132/81 | HR 84 | Temp 98.9°F | Resp 16 | Wt 207.0 lb

## 2023-06-24 DIAGNOSIS — E559 Vitamin D deficiency, unspecified: Secondary | ICD-10-CM | POA: Diagnosis not present

## 2023-06-24 DIAGNOSIS — I10 Essential (primary) hypertension: Secondary | ICD-10-CM | POA: Diagnosis not present

## 2023-06-24 DIAGNOSIS — R232 Flushing: Secondary | ICD-10-CM | POA: Diagnosis not present

## 2023-06-24 NOTE — Assessment & Plan Note (Signed)
Only occurring at night sometimes.  Did not start paxil which was prescribed by GYN.

## 2023-06-24 NOTE — Assessment & Plan Note (Signed)
At goal on amlodipine 10mg . Continue same.

## 2023-06-24 NOTE — Assessment & Plan Note (Signed)
Check vit D level. 

## 2023-06-24 NOTE — Progress Notes (Signed)
Subjective:     Patient ID: Joanna Wright, female    DOB: 1970-10-16, 52 y.o.   MRN: [id number]  Chief Complaint  Patient presents with   Hypertension    Here for follow up    Hypertension    Discussed the use of AI scribe software for clinical note transcription with the patient, who gave verbal consent to proceed.  History of Present Illness   The patient, with a history of hypertension managed with amlodipine 10mg , presents for a follow-up visit. She also has a history of low vitamin D, for which she completed a course of 50,000 units once a week for twelve weeks. She is not currently taking any vitamin D supplements.  The patient also reports occasional hot flashes, primarily at night. She was previously prescribed Paxil for this symptom but has chosen not to take it, preferring to avoid taking too many medications.  In addition, the patient has a history of a heart murmur. A previous evaluation in April showed a strong heart with normal function and good valves.      BP Readings from Last 3 Encounters:  06/24/23 132/81  03/05/23 131/75  12/16/22 136/84   She will get flu and covid vaccines soon at work.     Health Maintenance Due  Topic Date Due   Hepatitis C Screening  Never done    Past Medical History:  Diagnosis Date   DUB (dysfunctional uterine bleeding)    Hx   Hypertension    MHA (microangiopathic hemolytic anemia) (HCC)    Hx   Night sweats    Hx   Psoriasis of scalp     Past Surgical History:  Procedure Laterality Date   ACHILLES TENDON REPAIR Right 2010   BREAST BIOPSY Right 04/06/2019   CERVICAL POLYPECTOMY N/A 05/19/2014   Procedure: CERVICAL POLYPECTOMY;  Surgeon: Mickel Baas, MD;  Location: [facility];  Service: Gynecology;  Laterality: N/A;   EXTERNAL FIXATION LEG Left 01/03/2021   Procedure: EXTERNAL FIXATION ANKLE LEFT and right distal radius fracture ORIF;  Surgeon: Roby Lofts, MD;  Location: [facility];  Service: Orthopedics;   Laterality: Left;   EXTERNAL FIXATION REMOVAL Left 01/08/2021   Procedure: REMOVAL EXTERNAL FIXATION LEG;  Surgeon: Roby Lofts, MD;  Location: [facility];  Service: Orthopedics;  Laterality: Left;   HARDWARE REMOVAL Right 03/02/2021   Procedure: HARDWARE REMOVAL RIGHT ARM AND MANIPULATION OF LEFT ANKLE;  Surgeon: Roby Lofts, MD;  Location: [facility];  Service: Orthopedics;  Laterality: Right;   HYSTEROSCOPY N/A 05/19/2014   Procedure: HYSTEROSCOPY;  Surgeon: Mickel Baas, MD;  Location: [facility];  Service: Gynecology;  Laterality: N/A;   OPEN REDUCTION INTERNAL FIXATION (ORIF) TIBIA/FIBULA FRACTURE Left 01/08/2021   Procedure: OPEN REDUCTION INTERNAL FIXATION (ORIF) PILON FRACTURE;  Surgeon: Roby Lofts, MD;  Location: [facility];  Service: Orthopedics;  Laterality: Left;   WISDOM TOOTH EXTRACTION      Family History  Problem Relation Age of Onset   Hypertension Mother        had "bleeding on the brain"   Glaucoma Father    Blindness Father    Peripheral vascular disease Father    Cancer Maternal Grandfather        unsure what type   Blindness Paternal Grandfather    Hypertension Other    Kidney disease Neg Hx    Diabetes Neg Hx    Colon cancer Neg Hx    Colon polyps Neg Hx  Esophageal cancer Neg Hx    Rectal cancer Neg Hx    Stomach cancer Neg Hx     Social History   Socioeconomic History   Marital status: Married    Spouse name: Not on file   Number of children: Not on file   Years of education: Not on file   Highest education level: 12th grade  Occupational History   Not on file  Tobacco Use   Smoking status: Never   Smokeless tobacco: Never  Vaping Use   Vaping status: Never Used  Substance and Sexual Activity   Alcohol use: Not Currently    Alcohol/week: 0.0 standard drinks of alcohol    Comment: 1 drink per month   Drug use: No   Sexual activity: Yes    Partners: Male    Birth control/protection: Condom  Other Topics Concern   Not on file  Social  History Narrative   Recently remarried   No children   Works in Clinical biochemist, [employer]   Completed HS   Grew up in [place]   Enjoys sleeping and shopping   Has a pitbull   Social Determinants of Health   Financial Resource Strain: Low Risk  (06/23/2023)   Overall Financial Resource Strain (CARDIA)    Difficulty of Paying Living Expenses: Not very hard  Food Insecurity: No Food Insecurity (06/23/2023)   Hunger Vital Sign    Worried About Running Out of Food in the Last Year: Never true    Ran Out of Food in the Last Year: Never true  Transportation Needs: No Transportation Needs (06/23/2023)   PRAPARE - Administrator, Civil Service (Medical): No    Lack of Transportation (Non-Medical): No  Physical Activity: Unknown (06/23/2023)   Exercise Vital Sign    Days of Exercise per Week: Patient declined    Minutes of Exercise per Session: Not on file  Stress: Stress Concern Present (06/23/2023)   [employer] of Occupational Health - Occupational Stress Questionnaire    Feeling of Stress : To some extent  Social Connections: Unknown (06/23/2023)   Social Connection and Isolation Panel [NHANES]    Frequency of Communication with Friends and Family: Patient declined    Frequency of Social Gatherings with Friends and Family: Once a week    Attends Religious Services: More than 4 times per year    Active Member of [employer] or Organizations: Patient declined    Attends Banker Meetings: Not on file    Marital Status: Married  Intimate Partner Violence: Unknown (12/27/2021)   Received from [employer], [facility]   HITS    Physically Hurt: Not on file    Insult or Talk Down To: Not on file    Threaten Physical Harm: Not on file    Scream or Curse: Not on file    Outpatient Medications Prior to Visit  Medication Sig Dispense Refill   amLODipine (NORVASC) 10 MG tablet Take 1 tablet (10 mg total) by mouth daily. 90 tablet 1   norethindrone  (MICRONOR) 0.35 MG tablet Take 1 tablet (0.35 mg total) by mouth daily. 30 tablet 11   Vitamin D, Ergocalciferol, (DRISDOL) 1.25 MG (50000 UNIT) CAPS capsule Take 1 capsule (50,000 Units total) by mouth every 7 (seven) days. 12 capsule 0   PARoxetine (PAXIL) 10 MG tablet Take 1 tablet (10 mg total) by mouth daily. 30 tablet 3   No facility-administered medications prior to visit.    No Known Allergies  ROS  See HPI     Objective:    Physical Exam Constitutional:      General: She is not in acute distress.    Appearance: Normal appearance. She is well-developed.  HENT:     Head: Normocephalic and atraumatic.     Right Ear: External ear normal.     Left Ear: External ear normal.  Eyes:     General: No scleral icterus. Neck:     Thyroid: No thyromegaly.  Cardiovascular:     Rate and Rhythm: Normal rate and regular rhythm.     Heart sounds: Normal heart sounds. No murmur heard. Pulmonary:     Effort: Pulmonary effort is normal. No respiratory distress.     Breath sounds: Normal breath sounds. No wheezing.  Musculoskeletal:     Cervical back: Neck supple.  Skin:    General: Skin is warm and dry.  Neurological:     Mental Status: She is alert and oriented to person, place, and time.  Psychiatric:        Mood and Affect: Mood normal.        Behavior: Behavior normal.        Thought Content: Thought content normal.        Judgment: Judgment normal.      BP 132/81 (BP Location: Left Arm, Patient Position: Sitting, Cuff Size: Large)   Pulse 84   Temp 98.9 F (37.2 C) (Oral)   Resp 16   Wt 207 lb (93.9 kg)   LMP 05/16/2021   SpO2 100%   BMI 36.67 kg/m  Wt Readings from Last 3 Encounters:  06/24/23 207 lb (93.9 kg)  03/05/23 206 lb (93.4 kg)  12/16/22 200 lb (90.7 kg)       Assessment & Plan:   Problem List Items Addressed This Visit       Unprioritized   Vitamin D deficiency    Check vit D level.        Relevant Orders   VITAMIN D 25 Hydroxy (Vit-D  Deficiency, Fractures)   Hot flashes    Only occurring at night sometimes.  Did not start paxil which was prescribed by GYN.       Essential hypertension - Primary    At goal on amlodipine 10mg . Continue same.        I have discontinued Nada Maclachlan. Gaultney's PARoxetine. I am also having her maintain her amLODipine, Vitamin D (Ergocalciferol), and norethindrone.  No orders of the defined types were placed in this encounter.

## 2023-07-26 ENCOUNTER — Other Ambulatory Visit: Payer: Self-pay | Admitting: Family

## 2023-07-26 DIAGNOSIS — Z1231 Encounter for screening mammogram for malignant neoplasm of breast: Secondary | ICD-10-CM

## 2023-07-30 ENCOUNTER — Ambulatory Visit
Admission: RE | Admit: 2023-07-30 | Discharge: 2023-07-30 | Disposition: A | Discharge status: Home / Self Care | Payer: PRIVATE HEALTH INSURANCE | Source: Ambulatory Visit

## 2023-07-30 DIAGNOSIS — Z1231 Encounter for screening mammogram for malignant neoplasm of breast: Secondary | ICD-10-CM

## 2023-09-27 ENCOUNTER — Other Ambulatory Visit: Payer: Self-pay | Admitting: Family

## 2023-09-27 ENCOUNTER — Other Ambulatory Visit: Payer: Self-pay

## 2023-09-27 MED ORDER — AMLODIPINE BESYLATE 10 MG PO TABS
10.0000 mg | ORAL_TABLET | Freq: Every day | ORAL | 1 refills | Status: DC
  Filled 2023-09-27: qty 90, 90d supply, fill #0
  Filled 2024-02-04: qty 90, 90d supply, fill #1

## 2023-09-29 ENCOUNTER — Other Ambulatory Visit (HOSPITAL_BASED_OUTPATIENT_CLINIC_OR_DEPARTMENT_OTHER): Payer: Self-pay

## 2023-12-23 ENCOUNTER — Encounter: Payer: PRIVATE HEALTH INSURANCE | Admitting: Family

## 2023-12-23 ENCOUNTER — Ambulatory Visit (INDEPENDENT_AMBULATORY_CARE_PROVIDER_SITE_OTHER): Payer: PRIVATE HEALTH INSURANCE | Admitting: Family

## 2023-12-23 VITALS — BP 138/82 | HR 95 | Temp 98.5°F | Resp 16 | Ht 63.0 in | Wt 211.0 lb

## 2023-12-23 DIAGNOSIS — R2241 Localized swelling, mass and lump, right lower limb: Secondary | ICD-10-CM | POA: Diagnosis not present

## 2023-12-23 DIAGNOSIS — E559 Vitamin D deficiency, unspecified: Secondary | ICD-10-CM

## 2023-12-23 DIAGNOSIS — Z0000 Encounter for general adult medical examination without abnormal findings: Secondary | ICD-10-CM | POA: Diagnosis not present

## 2023-12-23 DIAGNOSIS — E785 Hyperlipidemia, unspecified: Secondary | ICD-10-CM

## 2023-12-23 DIAGNOSIS — I10 Essential (primary) hypertension: Secondary | ICD-10-CM

## 2023-12-23 DIAGNOSIS — Z1159 Encounter for screening for other viral diseases: Secondary | ICD-10-CM

## 2023-12-23 DIAGNOSIS — D649 Anemia, unspecified: Secondary | ICD-10-CM | POA: Diagnosis not present

## 2023-12-23 LAB — CBC WITH DIFFERENTIAL/PLATELET
Basophils Absolute: 0 10*3/uL (ref 0.0–0.1)
Basophils Relative: 0.7 % (ref 0.0–3.0)
Eosinophils Absolute: 0.1 10*3/uL (ref 0.0–0.7)
Eosinophils Relative: 1.4 % (ref 0.0–5.0)
HCT: 40.7 % (ref 36.0–46.0)
Hemoglobin: 13.8 g/dL (ref 12.0–15.0)
Lymphocytes Relative: 26.2 % (ref 12.0–46.0)
Lymphs Abs: 1 10*3/uL (ref 0.7–4.0)
MCHC: 33.8 g/dL (ref 30.0–36.0)
MCV: 93.5 fl (ref 78.0–100.0)
Monocytes Absolute: 0.3 10*3/uL (ref 0.1–1.0)
Monocytes Relative: 8.3 % (ref 3.0–12.0)
Neutro Abs: 2.4 10*3/uL (ref 1.4–7.7)
Neutrophils Relative %: 63.4 % (ref 43.0–77.0)
Platelets: 327 10*3/uL (ref 150.0–400.0)
RBC: 4.36 Mil/uL (ref 3.87–5.11)
RDW: 13.2 % (ref 11.5–15.5)
WBC: 3.8 10*3/uL — ABNORMAL LOW (ref 4.0–10.5)

## 2023-12-23 LAB — VITAMIN D 25 HYDROXY (VIT D DEFICIENCY, FRACTURES): VITD: 15.49 ng/mL — ABNORMAL LOW (ref 30.00–100.00)

## 2023-12-23 LAB — LIPID PANEL
Cholesterol: 231 mg/dL — ABNORMAL HIGH (ref 0–200)
HDL: 84.3 mg/dL (ref >39.00)
LDL Cholesterol: 136 mg/dL — ABNORMAL HIGH (ref 0–99)
NonHDL: 146.38
Total CHOL/HDL Ratio: 3
Triglycerides: 53 mg/dL (ref 0.0–149.0)
VLDL: 10.6 mg/dL (ref 0.0–40.0)

## 2023-12-23 LAB — COMPREHENSIVE METABOLIC PANEL WITH GFR
ALT: 13 U/L (ref 0–35)
AST: 13 U/L (ref 0–37)
Albumin: 4.7 g/dL (ref 3.5–5.2)
Alkaline Phosphatase: 98 U/L (ref 39–117)
BUN: 14 mg/dL (ref 6–23)
CO2: 26 meq/L (ref 19–32)
Calcium: 9.7 mg/dL (ref 8.4–10.5)
Chloride: 104 meq/L (ref 96–112)
Creatinine, Ser: 0.81 mg/dL (ref 0.40–1.20)
GFR: 83.28 mL/min (ref >60.00)
Glucose, Bld: 91 mg/dL (ref 70–99)
Potassium: 3.6 meq/L (ref 3.5–5.1)
Sodium: 142 meq/L (ref 135–145)
Total Bilirubin: 0.6 mg/dL (ref 0.2–1.2)
Total Protein: 7.7 g/dL (ref 6.0–8.3)

## 2023-12-23 NOTE — Patient Instructions (Addendum)
                         Contains text generated by [employer]bridge.     P

## 2023-12-23 NOTE — Assessment & Plan Note (Signed)
 Discussed healthy diet, exercise.  Tetanus, colo and pap up to date.
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
## 2023-12-23 NOTE — Assessment & Plan Note (Signed)
 Initial bp was elevated. Repeat BP OK, continue current dose of amlodipine.

## 2023-12-23 NOTE — Assessment & Plan Note (Signed)
 New- refer to podiatry for further evaluation.

## 2023-12-24 ENCOUNTER — Telehealth: Payer: Self-pay | Admitting: Family

## 2023-12-24 DIAGNOSIS — E559 Vitamin D deficiency, unspecified: Secondary | ICD-10-CM

## 2023-12-24 LAB — HEPATITIS C ANTIBODY: Hepatitis C Ab: NONREACTIVE

## 2023-12-24 MED ORDER — VITAMIN D (ERGOCALCIFEROL) 1.25 MG (50000 UNIT) PO CAPS
50000.0000 [IU] | ORAL_CAPSULE | ORAL | 0 refills | Status: DC
Start: 2023-12-24 — End: 2024-02-04

## 2023-12-24 NOTE — Telephone Encounter (Signed)
 Vitamin D level is low.  Advise patient to begin vit D 50000 units once weekly for 12 weeks, then repeat vit D level (dx Vit D deficiency).    Cholesterol remains mildly elevated. Please continue to work on a low fat/low cholesterol diet, exercise and weight loss.

## 2023-12-24 NOTE — Telephone Encounter (Signed)
 Patient notified of results, new prescription and scheduled to return in 3 months

## 2023-12-30 ENCOUNTER — Ambulatory Visit: Payer: PRIVATE HEALTH INSURANCE | Admitting: Podiatry

## 2023-12-30 ENCOUNTER — Ambulatory Visit (INDEPENDENT_AMBULATORY_CARE_PROVIDER_SITE_OTHER): Payer: PRIVATE HEALTH INSURANCE

## 2023-12-30 DIAGNOSIS — M722 Plantar fascial fibromatosis: Secondary | ICD-10-CM

## 2023-12-30 NOTE — Progress Notes (Signed)
 Subjective:  Patient ID: Joanna Wright, female    DOB: 11-26-1970,  MRN: [id number] HPI Chief Complaint  Patient presents with   Foot Problem    Started out as a small mass last year, about the size of a pea. Now it is the size of a marble. Some pain with certain she's, localized to just that spot. No noticeable swelling or discoloration.     53 y.o. female presents with the above complaint.   ROS: Denies fever chills nausea vomiting muscle aches pains calf pain back pain chest pain shortness of breath  Past Medical History:  Diagnosis Date   DUB (dysfunctional uterine bleeding)    Hx   Hypertension    MHA (microangiopathic hemolytic anemia) (HCC)    Hx   Night sweats    Hx   Psoriasis of scalp    Past Surgical History:  Procedure Laterality Date   ACHILLES TENDON REPAIR Right 2010   BREAST BIOPSY Right 04/06/2019   CERVICAL POLYPECTOMY N/A 05/19/2014   Procedure: CERVICAL POLYPECTOMY;  Surgeon: Mickel Baas, MD;  Location: [facility];  Service: Gynecology;  Laterality: N/A;   EXTERNAL FIXATION LEG Left 01/03/2021   Procedure: EXTERNAL FIXATION ANKLE LEFT and right distal radius fracture ORIF;  Surgeon: Roby Lofts, MD;  [facility];  Service: Orthopedics;  Laterality: Left;   EXTERNAL FIXATION REMOVAL Left 01/08/2021   Procedure: REMOVAL EXTERNAL FIXATION LEG;  Surgeon: Roby Lofts, MD;  [facility];  Service: Orthopedics;  Laterality: Left;   HARDWARE REMOVAL Right 03/02/2021   Procedure: HARDWARE REMOVAL RIGHT ARM AND MANIPULATION OF LEFT ANKLE;  Surgeon: Roby Lofts, MD;  [facility];  Service: Orthopedics;  Laterality: Right;   HYSTEROSCOPY N/A 05/19/2014   Procedure: HYSTEROSCOPY;  Surgeon: Mickel Baas, MD;  Location: [facility];  Service: Gynecology;  Laterality: N/A;   OPEN REDUCTION INTERNAL FIXATION (ORIF) TIBIA/FIBULA FRACTURE Left 01/08/2021   Procedure: OPEN REDUCTION INTERNAL FIXATION (ORIF) PILON FRACTURE;  Surgeon: Roby Lofts,  MD;  [facility]: [facility];  Service: Orthopedics;  Laterality: Left;   WISDOM TOOTH EXTRACTION      Current Outpatient Medications:    amLODipine (NORVASC) 10 MG tablet, Take 1 tablet (10 mg total) by mouth daily., Disp: 90 tablet, Rfl: 1   norethindrone (MICRONOR) 0.35 MG tablet, Take 1 tablet (0.35 mg total) by mouth daily., Disp: 30 tablet, Rfl: 11   Vitamin D, Ergocalciferol, (DRISDOL) 1.25 MG (50000 UNIT) CAPS capsule, Take 1 capsule (50,000 Units total) by mouth every 7 (seven) days., Disp: 12 capsule, Rfl: 0  No Known Allergies Review of Systems Objective:  There were no vitals filed for this visit.  General: Well developed, nourished, in no acute distress, alert and oriented x3   Dermatological: Skin is warm, dry and supple bilateral. Nails x 10 are well maintained; remaining integument appears unremarkable at this time. There are no open sores, no preulcerative lesions, no rash or signs of infection present.  Vascular: Dorsalis Pedis artery and Posterior Tibial artery pedal pulses are 2/4 bilateral with immedate capillary fill time. Pedal hair growth present. No varicosities and no lower extremity edema present bilateral.   Neruologic: Grossly intact via light touch bilateral. Vibratory intact via tuning fork bilateral. Protective threshold with Semmes Wienstein monofilament intact to all pedal sites bilateral. Patellar and Achilles deep tendon reflexes 2+ bilateral. No Babinski or clonus noted bilateral.   Musculoskeletal: No gross boney pedal deformities bilateral. No pain, crepitus, or limitation noted with foot  and ankle range of motion bilateral. Muscular strength 5/5 in all groups tested bilateral.  Firm nonpulsatile nodular medial band of the plantar fascia centrally located medial longitudinal arch.  Measures approximately 0.7 cm in diameter.  Nontender.  Gait: Unassisted, Nonantalgic.    Radiographs:  Radiographs taken today demonstrate an osseous immature individual with  good bone mineralization.  Soft tissue marker was placed on the plantar aspect of the foot localizing lesion.  Does not demonstrate any calcification.  No significant acute abnormalities identified.  Assessment & Plan:   Assessment: Plantar fibroma right mid arch.  Plan: Discussed etiology pathology conservative versus surgical.  I explained to her that this may worsen over time.  Currently most effective treatment is injection and surgical excision neither of which we want to do at this point.  I will follow-up with her on an as-needed basis.     Maurica Omura T. [place], [place]

## 2024-02-04 ENCOUNTER — Other Ambulatory Visit: Payer: Self-pay | Admitting: Family

## 2024-02-04 ENCOUNTER — Telehealth: Payer: Self-pay | Admitting: Family

## 2024-02-04 DIAGNOSIS — E559 Vitamin D deficiency, unspecified: Secondary | ICD-10-CM

## 2024-02-04 MED ORDER — VITAMIN D (ERGOCALCIFEROL) 1.25 MG (50000 UNIT) PO CAPS: 50000.0000 [IU] | ORAL_CAPSULE | ORAL | 0 refills | Status: DC

## 2024-02-04 NOTE — Telephone Encounter (Signed)
 See mychart.

## 2024-02-04 NOTE — Addendum Note (Signed)
 Addended by: Eulalia Ellerman D on: 02/04/2024 01:35 PM   Modules accepted: Orders

## 2024-03-24 ENCOUNTER — Encounter: Payer: Self-pay | Admitting: Family

## 2024-03-24 ENCOUNTER — Other Ambulatory Visit (HOSPITAL_BASED_OUTPATIENT_CLINIC_OR_DEPARTMENT_OTHER): Payer: Self-pay

## 2024-03-24 ENCOUNTER — Other Ambulatory Visit (INDEPENDENT_AMBULATORY_CARE_PROVIDER_SITE_OTHER): Payer: PRIVATE HEALTH INSURANCE

## 2024-03-24 DIAGNOSIS — E559 Vitamin D deficiency, unspecified: Secondary | ICD-10-CM

## 2024-03-24 DIAGNOSIS — N951 Menopausal and female climacteric states: Secondary | ICD-10-CM

## 2024-03-24 LAB — VITAMIN D 25 HYDROXY (VIT D DEFICIENCY, FRACTURES): VITD: 29.54 ng/mL — ABNORMAL LOW (ref 30.00–100.00)

## 2024-03-24 MED ORDER — NORETHINDRONE 0.35 MG PO TABS
1.0000 | ORAL_TABLET | Freq: Every day | ORAL | 2 refills | Status: DC
  Filled 2024-03-24: qty 28, 28d supply, fill #0
  Filled 2024-04-19: qty 28, 28d supply, fill #1
  Filled 2024-05-17: qty 28, 28d supply, fill #2
  Filled 2024-06-14: qty 28, 28d supply, fill #3

## 2024-03-26 ENCOUNTER — Ambulatory Visit: Payer: Self-pay | Admitting: Family

## 2024-03-26 DIAGNOSIS — E559 Vitamin D deficiency, unspecified: Secondary | ICD-10-CM

## 2024-03-26 MED ORDER — VITAMIN D3 75 MCG (3000 UT) PO TABS: 1.0000 | ORAL_TABLET | Freq: Every day | ORAL | Status: AC

## 2024-03-26 NOTE — Telephone Encounter (Signed)
 See mychart.

## 2024-05-17 ENCOUNTER — Other Ambulatory Visit: Payer: Self-pay | Admitting: Family

## 2024-05-17 ENCOUNTER — Other Ambulatory Visit (HOSPITAL_BASED_OUTPATIENT_CLINIC_OR_DEPARTMENT_OTHER): Payer: Self-pay

## 2024-05-17 MED ORDER — AMLODIPINE BESYLATE 10 MG PO TABS
10.0000 mg | ORAL_TABLET | Freq: Every day | ORAL | 1 refills | Status: AC
  Filled 2024-05-17: qty 90, 90d supply, fill #0
  Filled 2024-09-02: qty 90, 90d supply, fill #1

## 2024-06-14 ENCOUNTER — Other Ambulatory Visit: Payer: Self-pay | Admitting: Family

## 2024-06-14 ENCOUNTER — Other Ambulatory Visit (HOSPITAL_BASED_OUTPATIENT_CLINIC_OR_DEPARTMENT_OTHER): Payer: Self-pay

## 2024-06-14 ENCOUNTER — Other Ambulatory Visit: Payer: Self-pay

## 2024-06-14 ENCOUNTER — Other Ambulatory Visit (HOSPITAL_COMMUNITY): Payer: Self-pay

## 2024-06-14 DIAGNOSIS — N951 Menopausal and female climacteric states: Secondary | ICD-10-CM

## 2024-06-14 MED ORDER — NORETHINDRONE 0.35 MG PO TABS
1.0000 | ORAL_TABLET | Freq: Every day | ORAL | 2 refills | Status: DC
  Filled 2024-06-14: qty 30, 30d supply, fill #0

## 2024-06-14 MED ORDER — NORETHINDRONE 0.35 MG PO TABS
1.0000 | ORAL_TABLET | Freq: Every day | ORAL | 5 refills | Status: AC
  Filled 2024-06-14 (×2): qty 28, 28d supply, fill #0
  Filled 2024-07-12: qty 28, 28d supply, fill #1
  Filled 2024-08-04: qty 28, 28d supply, fill #2
  Filled 2024-09-02: qty 28, 28d supply, fill #3
  Filled 2024-09-30: qty 28, 28d supply, fill #4

## 2024-06-14 NOTE — Addendum Note (Signed)
 Addended by: DARYL SETTER on: 06/14/2024 01:20 PM   Modules accepted: Orders

## 2024-06-18 DIAGNOSIS — Z1231 Encounter for screening mammogram for malignant neoplasm of breast: Secondary | ICD-10-CM

## 2024-06-29 ENCOUNTER — Encounter: Payer: Self-pay | Admitting: Family

## 2024-06-29 ENCOUNTER — Ambulatory Visit: Payer: PRIVATE HEALTH INSURANCE | Admitting: Family

## 2024-06-29 VITALS — BP 108/63 | HR 95 | Temp 99.2°F | Resp 16 | Ht 63.0 in | Wt 183.4 lb

## 2024-06-29 DIAGNOSIS — D649 Anemia, unspecified: Secondary | ICD-10-CM

## 2024-06-29 DIAGNOSIS — I10 Essential (primary) hypertension: Secondary | ICD-10-CM | POA: Diagnosis not present

## 2024-06-29 DIAGNOSIS — E010 Iodine-deficiency related diffuse (endemic) goiter: Secondary | ICD-10-CM

## 2024-06-29 DIAGNOSIS — R011 Cardiac murmur, unspecified: Secondary | ICD-10-CM

## 2024-06-29 DIAGNOSIS — Z23 Encounter for immunization: Secondary | ICD-10-CM

## 2024-06-29 DIAGNOSIS — E785 Hyperlipidemia, unspecified: Secondary | ICD-10-CM

## 2024-06-29 DIAGNOSIS — R2231 Localized swelling, mass and lump, right upper limb: Secondary | ICD-10-CM

## 2024-06-29 DIAGNOSIS — E559 Vitamin D deficiency, unspecified: Secondary | ICD-10-CM | POA: Diagnosis not present

## 2024-06-29 DIAGNOSIS — K4020 Bilateral inguinal hernia, without obstruction or gangrene, not specified as recurrent: Secondary | ICD-10-CM

## 2024-06-29 DIAGNOSIS — R232 Flushing: Secondary | ICD-10-CM

## 2024-06-29 DIAGNOSIS — Z1231 Encounter for screening mammogram for malignant neoplasm of breast: Secondary | ICD-10-CM

## 2024-06-29 NOTE — Assessment & Plan Note (Signed)
 Not taking otc Vit D.  Will start.

## 2024-06-29 NOTE — Progress Notes (Signed)
 Subjective:     Patient ID: Joanna Wright, female    DOB: [birth year], 53 y.o.   MRN: [id number]  Chief Complaint  Patient presents with   Hypertension    Here for follow up   Cyst    Patient reports feeling a knott under right arm     Hypertension    Discussed the use of AI scribe software for clinical note transcription with the patient, who gave verbal consent to proceed.  History of Present Illness  JADEE Wright is a 53 year old female with hypertension who presents for a follow-up on her medication.  She is taking amlodipine  10 mg daily with well-controlled blood pressure at 108/63 mmHg. She experiences occasional swelling in one foot. She often forgets to take over-the-counter vitamin D  supplements, and her last vitamin D  level was slightly low. She mentions a small, tender knot in her armpit that comes and goes. She is currently on birth control pills and is awaiting further evaluation regarding her continuation- states she is following with another provider for this.      Health Maintenance Due  Topic Date Due   Pneumococcal Vaccine: 50+ Years (1 of 1 - PCV) Never done   COVID-19 Vaccine (5 - 2025-26 season) 05/24/2024   Mammogram  07/29/2024    Past Medical History:  Diagnosis Date   Closed left pilon fracture 01/03/2021   Complete miscarriage 02/22/2016   High Risk Tri 18     DUB (dysfunctional uterine bleeding)    Hx   Hypertension    MHA (microangiopathic hemolytic anemia) (HCC)    Hx   Night sweats    Hx   Psoriasis of scalp     Past Surgical History:  Procedure Laterality Date   ACHILLES TENDON REPAIR Right 2010   BREAST BIOPSY Right 04/06/2019   CERVICAL POLYPECTOMY N/A 05/19/2014   Procedure: CERVICAL POLYPECTOMY;  Surgeon: Charlie JONETTA Aho, MD;  Location: [facility];  Service: Gynecology;  Laterality: N/A;   EXTERNAL FIXATION LEG Left 01/03/2021   Procedure: EXTERNAL FIXATION ANKLE LEFT and right distal radius fracture ORIF;  Surgeon:  Kendal Franky SQUIBB, MD;  [facility];  Service: Orthopedics;  Laterality: Left;   EXTERNAL FIXATION REMOVAL Left 01/08/2021   Procedure: REMOVAL EXTERNAL FIXATION LEG;  Surgeon: Kendal Franky SQUIBB, MD;  [facility];  Service: Orthopedics;  Laterality: Left;   HARDWARE REMOVAL Right 03/02/2021   Procedure: HARDWARE REMOVAL RIGHT ARM AND MANIPULATION OF LEFT ANKLE;  Surgeon: Kendal Franky SQUIBB, MD;  [facility];  Service: Orthopedics;  Laterality: Right;   HYSTEROSCOPY N/A 05/19/2014   Procedure: HYSTEROSCOPY;  Surgeon: Charlie JONETTA Aho, MD;  Location: [facility];  Service: Gynecology;  Laterality: N/A;   OPEN REDUCTION INTERNAL FIXATION (ORIF) TIBIA/FIBULA FRACTURE Left 01/08/2021   Procedure: OPEN REDUCTION INTERNAL FIXATION (ORIF) PILON FRACTURE;  Surgeon: Kendal Franky SQUIBB, MD;  [facility];  Service: Orthopedics;  Laterality: Left;   WISDOM TOOTH EXTRACTION      Family History  Problem Relation Age of Onset   Hypertension Mother        had bleeding on the brain   Glaucoma Father    Blindness Father    Peripheral vascular disease Father    Cancer Maternal Grandfather        unsure what type   Blindness Paternal Grandfather    Hypertension Other    Kidney disease Neg Hx    Diabetes Neg Hx    Colon cancer Neg Hx  Colon polyps Neg Hx    Esophageal cancer Neg Hx    Rectal cancer Neg Hx    Stomach cancer Neg Hx     Social History   Socioeconomic History   Marital status: Married    Spouse name: Not on file   Number of children: Not on file   Years of education: Not on file   Highest education level: Never attended school  Occupational History   Not on file  Tobacco Use   Smoking status: Never   Smokeless tobacco: Never  Vaping Use   Vaping status: Never Used  Substance and Sexual Activity   Alcohol use: Not Currently    Alcohol/week: 0.0 standard drinks of alcohol    Comment: 1 drink per month   Drug use: No   Sexual activity: Yes    Partners: Male    Birth  control/protection: Condom  Other Topics Concern   Not on file  Social History Narrative   Recently remarried   No children   Works in Clinical biochemist, [employer]   Completed HS   Grew up in [place]   Enjoys sleeping and shopping   Has a pitbull   Social Drivers of Health   Financial Resource Strain: Patient Declined (06/23/2024)   Overall Financial Resource Strain (CARDIA)    Difficulty of Paying Living Expenses: Patient declined  Food Insecurity: Patient Declined (06/23/2024)   Hunger Vital Sign    Worried About Running Out of Food in the Last Year: Patient declined    Ran Out of Food in the Last Year: Patient declined  Transportation Needs: Patient Declined (06/23/2024)   PRAPARE - Administrator, Civil Service (Medical): Patient declined    Lack of Transportation (Non-Medical): Patient declined  Physical Activity: Unknown (06/23/2024)   Exercise Vital Sign    Days of Exercise per Week: Patient declined    Minutes of Exercise per Session: Not on file  Stress: Patient Declined (06/23/2024)   [employer] of Occupational Health - Occupational Stress Questionnaire    Feeling of Stress: Patient declined  Social Connections: Unknown (06/23/2024)   Social Connection and Isolation Panel    Frequency of Communication with Friends and Family: Patient declined    Frequency of Social Gatherings with Friends and Family: Patient declined    Attends Religious Services: Patient declined    Database administrator or Organizations: Patient declined    Attends Banker Meetings: Not on file    Marital Status: Patient declined  Intimate Partner Violence: Unknown (12/27/2021)   Received from [facility]   HITS    Physically Hurt: Not on file    Insult or Talk Down To: Not on file    Threaten Physical Harm: Not on file    Scream or Curse: Not on file    Outpatient Medications Prior to Visit  Medication Sig Dispense Refill   amLODipine  (NORVASC ) 10 MG  tablet Take 1 tablet (10 mg total) by mouth daily. 90 tablet 1   Cholecalciferol (VITAMIN D3) 75 MCG (3000 UT) TABS Take 1 tablet by mouth daily at 6 (six) AM.     norethindrone  (INCASSIA ) 0.35 MG tablet Take 1 tablet (0.35 mg total) by mouth daily. 28 tablet 5   No facility-administered medications prior to visit.    No Known Allergies  ROS See HPI    Objective:    Physical Exam Constitutional:      General: She is not in acute distress.    Appearance:  Normal appearance. She is well-developed.  HENT:     Head: Normocephalic and atraumatic.     Right Ear: External ear normal.     Left Ear: External ear normal.  Eyes:     General: No scleral icterus. Neck:     Thyroid : Thyromegaly (mild thyroid  fullness) present.  Cardiovascular:     Rate and Rhythm: Normal rate and regular rhythm.     Heart sounds: Murmur heard.     Systolic murmur is present with a grade of 1/6.  Pulmonary:     Effort: Pulmonary effort is normal. No respiratory distress.     Breath sounds: Normal breath sounds. No wheezing.  Musculoskeletal:     Cervical back: Neck supple.  Skin:    General: Skin is warm and dry.  Neurological:     Mental Status: She is alert and oriented to person, place, and time.  Psychiatric:        Mood and Affect: Mood normal.        Behavior: Behavior normal.        Thought Content: Thought content normal.        Judgment: Judgment normal.   R Axilla- marble sized, tender mobile mass   BP 108/63 (BP Location: Right Arm, Patient Position: Sitting, Cuff Size: Large)   Pulse 95   Temp 99.2 F (37.3 C) (Oral)   Resp 16   Ht 5' 3 (1.6 m)   Wt 183 lb 6.4 oz (83.2 kg)   LMP 05/16/2021   BMI 32.49 kg/m  Wt Readings from Last 3 Encounters:  06/29/24 183 lb 6.4 oz (83.2 kg)  12/23/23 211 lb (95.7 kg)  06/24/23 207 lb (93.9 kg)       Assessment & Plan:   Problem List Items Addressed This Visit       Unprioritized   Vitamin D  deficiency   Not taking otc Vit D.   Will start.       Thyromegaly   Had normal thyroid  US  2023       Non-recurrent bilateral inguinal hernia without obstruction or gangrene   No longer bothering her- defer surgical consult.        Murmur   Slight tricuspid leak on echo last year, otherwise normal echo. Monitor.       Hypertension   BP looks great on amlodipine  10mg . Continue same.       Hyperlipidemia   Lab Results  Component Value Date   CHOL 231 (H) 12/23/2023   HDL 84.30 12/23/2023   LDLCALC 136 (H) 12/23/2023   LDLDIRECT 107.5 06/13/2011   TRIG 53.0 12/23/2023   CHOLHDL 3 12/23/2023   Not on medication- continues to work on diet.        Hot flashes   Improved- monitor.       Anemia   Lab Results  Component Value Date   WBC 3.8 (L) 12/23/2023   HGB 13.8 12/23/2023   HCT 40.7 12/23/2023   MCV 93.5 12/23/2023   PLT 327.0 12/23/2023   Resolved now that she is menopausal.       Other Visit Diagnoses       Needs flu shot    -  Primary   Relevant Orders   Flu vaccine trivalent PF, 6mos and older(Flulaval,Afluria,Fluarix,Fluzone) (Completed)     Breast cancer screening by mammogram       Relevant Orders   MM 3D SCREENING MAMMOGRAM BILATERAL BREAST     Axillary mass, right  Relevant Orders   MM Digital Diagnostic Unilat R   US  BREAST COMPLETE UNI RIGHT INC AXILLA       I am having Joanna HERO. Janus maintain her Vitamin D3, amLODipine , and norethindrone .  No orders of the defined types were placed in this encounter.

## 2024-06-29 NOTE — Patient Instructions (Signed)
 VISIT SUMMARY:  Today, we reviewed your hypertension management, discussed your vitamin D  levels, addressed a tender knot in your armpit, and talked about your postmenopausal state and birth control options. We also covered some general health maintenance topics.  YOUR PLAN:  RIGHT AXILLARY LYMPHADENOPATHY: You have intermittent swelling in your right armpit, which could be a lymph node or a skin infection. -We will order an ultrasound of your right breast and right armpit to investigate further.  HYPERTENSION: Your blood pressure is well-controlled with your current medication. -Continue taking amlodipine  10 mg once daily.  VITAMIN D  DEFICIENCY: Your vitamin D  level was slightly low, and you have not been taking your supplements regularly. -Start taking vitamin D  3000 units daily.  POSTMENOPAUSAL STATE: You are following with Dr. Benjamine in regards to your birth control continuation.   GENERAL HEALTH MAINTENANCE: We discussed some vaccines and your upcoming mammogram. -You received a flu shot today. -We will order a mammogram for November. -We discussed hepatitis B and pneumonia vaccines; you decided to defer the pneumonia vaccine for a future visit.

## 2024-06-29 NOTE — Assessment & Plan Note (Signed)
 BP looks great on amlodipine  10mg . Continue same.

## 2024-06-29 NOTE — Assessment & Plan Note (Signed)
 Lab Results  Component Value Date   CHOL 231 (H) 12/23/2023   HDL 84.30 12/23/2023   LDLCALC 136 (H) 12/23/2023   LDLDIRECT 107.5 06/13/2011   TRIG 53.0 12/23/2023   CHOLHDL 3 12/23/2023   Not on medication- continues to work on diet.

## 2024-06-29 NOTE — Assessment & Plan Note (Signed)
 Had normal thyroid  US  2023

## 2024-06-29 NOTE — Assessment & Plan Note (Signed)
 Slight tricuspid leak on echo last year, otherwise normal echo. Monitor.

## 2024-06-29 NOTE — Assessment & Plan Note (Signed)
 Improved - monitor

## 2024-06-29 NOTE — Assessment & Plan Note (Signed)
 No longer bothering her- defer surgical consult.

## 2024-06-29 NOTE — Assessment & Plan Note (Signed)
 Lab Results  Component Value Date   WBC 3.8 (L) 12/23/2023   HGB 13.8 12/23/2023   HCT 40.7 12/23/2023   MCV 93.5 12/23/2023   PLT 327.0 12/23/2023   Resolved now that she is menopausal.

## 2024-07-07 ENCOUNTER — Other Ambulatory Visit: Payer: Self-pay | Admitting: Family

## 2024-07-07 ENCOUNTER — Ambulatory Visit
Admission: RE | Admit: 2024-07-07 | Discharge: 2024-07-07 | Disposition: A | Source: Ambulatory Visit | Attending: Family | Admitting: Family

## 2024-07-07 ENCOUNTER — Ambulatory Visit
Admission: RE | Admit: 2024-07-07 | Discharge: 2024-07-07 | Disposition: A | Discharge status: Home / Self Care | Source: Ambulatory Visit | Attending: Family | Admitting: Family

## 2024-07-07 DIAGNOSIS — R2231 Localized swelling, mass and lump, right upper limb: Secondary | ICD-10-CM

## 2024-07-07 DIAGNOSIS — K4020 Bilateral inguinal hernia, without obstruction or gangrene, not specified as recurrent: Secondary | ICD-10-CM

## 2024-07-07 DIAGNOSIS — Z23 Encounter for immunization: Secondary | ICD-10-CM

## 2024-07-07 DIAGNOSIS — I10 Essential (primary) hypertension: Secondary | ICD-10-CM

## 2024-07-07 DIAGNOSIS — D649 Anemia, unspecified: Secondary | ICD-10-CM

## 2024-07-07 DIAGNOSIS — R011 Cardiac murmur, unspecified: Secondary | ICD-10-CM

## 2024-07-07 DIAGNOSIS — E785 Hyperlipidemia, unspecified: Secondary | ICD-10-CM

## 2024-07-07 DIAGNOSIS — R232 Flushing: Secondary | ICD-10-CM

## 2024-07-07 DIAGNOSIS — E010 Iodine-deficiency related diffuse (endemic) goiter: Secondary | ICD-10-CM

## 2024-07-07 DIAGNOSIS — Z1231 Encounter for screening mammogram for malignant neoplasm of breast: Secondary | ICD-10-CM

## 2024-07-07 DIAGNOSIS — E559 Vitamin D deficiency, unspecified: Secondary | ICD-10-CM

## 2024-08-06 ENCOUNTER — Other Ambulatory Visit: Payer: Self-pay | Admitting: Family

## 2024-08-06 DIAGNOSIS — E559 Vitamin D deficiency, unspecified: Secondary | ICD-10-CM

## 2024-08-11 ENCOUNTER — Emergency Department (HOSPITAL_BASED_OUTPATIENT_CLINIC_OR_DEPARTMENT_OTHER)
Admission: EM | Admit: 2024-08-11 | Discharge: 2024-08-11 | Disposition: A | Discharge status: Home / Self Care | Attending: Emergency Medicine | Admitting: Emergency Medicine

## 2024-08-11 ENCOUNTER — Other Ambulatory Visit: Payer: Self-pay

## 2024-08-11 ENCOUNTER — Emergency Department (HOSPITAL_BASED_OUTPATIENT_CLINIC_OR_DEPARTMENT_OTHER)

## 2024-08-11 DIAGNOSIS — R600 Localized edema: Secondary | ICD-10-CM | POA: Insufficient documentation

## 2024-08-11 DIAGNOSIS — X501XXA Overexertion from prolonged static or awkward postures, initial encounter: Secondary | ICD-10-CM | POA: Insufficient documentation

## 2024-08-11 DIAGNOSIS — M79605 Pain in left leg: Secondary | ICD-10-CM | POA: Insufficient documentation

## 2024-08-11 NOTE — ED Provider Notes (Signed)
 [facility] Provider Note   CSN: [id number] Arrival date & time: 08/11/24  1357     Patient presents with: No chief complaint on file.   Joanna Wright is a 53 y.o. female.   53 year old female presents today for concern of tightness and pain in the left leg starting around in the and extending distally to about the left ankle.  States a couple days ago she was going down steps and she heard her left knee pop.  No pain right at that moment.  She also has history of hardware in her left ankle.  Denies any recent long travel, recent surgery, prior history of DVT or PE.  Not on hormone replacement therapy.  The history is provided by the patient. No language interpreter was used.       Prior to Admission medications   Medication Sig Start Date End Date Taking? Authorizing Provider  Vitamin D , Ergocalciferol , (DRISDOL ) 1.25 MG (50000 UNIT) CAPS capsule Take 50,000 Units by mouth once a week. 05/14/24  Yes [provider]  amLODipine  (NORVASC ) 10 MG tablet Take 1 tablet (10 mg total) by mouth daily. 05/17/24   O'Sullivan, Melissa, NP  Cholecalciferol (VITAMIN D3) 75 MCG (3000 UT) TABS Take 1 tablet by mouth daily at 6 (six) AM. 03/26/24   O'Sullivan, Melissa, NP  norethindrone  (INCASSIA ) 0.35 MG tablet Take 1 tablet (0.35 mg total) by mouth daily. 06/14/24   O'Sullivan, Melissa, NP    Allergies: Patient has no known allergies.    Review of Systems  Constitutional:  Negative for chills and fever.  Cardiovascular:  Negative for leg swelling.  Musculoskeletal:  Positive for myalgias. Negative for arthralgias.  Neurological:  Negative for light-headedness.  All other systems reviewed and are negative.   Updated Vital Signs BP (!) 149/83 (BP Location: Right Arm)   Pulse (!) 104   Temp 99.5 F (37.5 C) (Oral)   Resp 16   LMP 05/16/2021   SpO2 96%   Physical Exam Vitals and nursing note reviewed.  Constitutional:       General: She is not in acute distress.    Appearance: Normal appearance. She is not ill-appearing.  HENT:     Head: Normocephalic and atraumatic.     Nose: Nose normal.  Eyes:     Conjunctiva/sclera: Conjunctivae normal.  Pulmonary:     Effort: Pulmonary effort is normal. No respiratory distress.  Musculoskeletal:        General: No deformity. Normal range of motion.     Right lower leg: No edema.     Left lower leg: Edema (Left lower extremity swelling more so compared to the right.  Nonpitting.) present.     Comments: Soft compartments in the left lower extremity.  Left knee without any visible swelling.  Left ankle without swelling or deformity.  No tenderness over the left knee, left ankle.  No pain over the left hip.  Ambulates without difficulty.  Skin:    Findings: No rash.  Neurological:     Mental Status: She is alert.     (all labs ordered are listed, but only abnormal results are displayed) Labs Reviewed - No data to display  EKG: None  Radiology: No results found.   Procedures   Medications Ordered in the ED - No data to display  Medical Decision Making  53 year old female presents today for concern of left leg pain.  On exam she has asymmetrical swelling.  Neurovascularly intact.  Also initially noted to be slightly tachycardic when she arrived.  Currently with normal rate.  Will obtain ultrasound to rule out DVT.  Otherwise she does have an established orthopedic that she can follow-up with.  She does not have any significant tenderness over her left ankle, left knee joint.  Her compartments are soft.  DVT study negative. Does show Baker's cyst.  Supportive care discussed.  She will follow-up with her orthopedist. Return precautions discussed. Stable for discharge.  Final diagnoses:  Left leg pain    ED Discharge Orders     None          Hildegard Loge, PA-C 08/11/24 1525    Patsey Lot, MD 08/12/24  1441

## 2024-08-11 NOTE — Discharge Instructions (Signed)
 Ultrasound did not show any evidence of blood clot.  Please follow-up with your orthopedist given you had some knee pain.  Return for any concerning symptoms.  Take ibuprofen 600 mg 3 times a day for the next 5 days.  Do not take this if you have been told not to take anti-inflammatory medications or if you have kidney problems.

## 2024-08-11 NOTE — ED Triage Notes (Addendum)
 Reports having tightness in left leg this am. Now moving up to the left hip. Denies other symptoms.  Denies recent travel/surgery

## 2024-09-30 ENCOUNTER — Other Ambulatory Visit (HOSPITAL_BASED_OUTPATIENT_CLINIC_OR_DEPARTMENT_OTHER): Payer: Self-pay

## 2024-10-26 ENCOUNTER — Emergency Department (HOSPITAL_BASED_OUTPATIENT_CLINIC_OR_DEPARTMENT_OTHER)

## 2024-10-26 ENCOUNTER — Encounter (HOSPITAL_BASED_OUTPATIENT_CLINIC_OR_DEPARTMENT_OTHER): Payer: Self-pay

## 2024-10-26 ENCOUNTER — Emergency Department (HOSPITAL_BASED_OUTPATIENT_CLINIC_OR_DEPARTMENT_OTHER): Admission: EM | Admit: 2024-10-26 | Discharge: 2024-10-26 | Disposition: A | Discharge status: Home / Self Care

## 2024-10-26 ENCOUNTER — Other Ambulatory Visit: Payer: Self-pay

## 2024-10-26 DIAGNOSIS — M79662 Pain in left lower leg: Secondary | ICD-10-CM | POA: Insufficient documentation

## 2024-10-26 DIAGNOSIS — M79605 Pain in left leg: Secondary | ICD-10-CM

## 2024-10-26 DIAGNOSIS — W000XXA Fall on same level due to ice and snow, initial encounter: Secondary | ICD-10-CM | POA: Insufficient documentation

## 2024-10-26 MED ORDER — KETOROLAC TROMETHAMINE 15 MG/ML IJ SOLN
15.0000 mg | Freq: Once | INTRAMUSCULAR | Status: AC
  Administered 2024-10-26: 15 mg via INTRAMUSCULAR
  Filled 2024-10-26: qty 1

## 2024-10-26 NOTE — ED Triage Notes (Signed)
 Slipped on ice. Reports L calf pain since. Ambulatory

## 2024-10-26 NOTE — Discharge Instructions (Signed)
 X-ray did not show any signs of acute fractures or dislocation.  You may use over-the-counter Ibuprofen (Motrin. Advil) or Naproxen (Aleve) not both. Additionally you can take Acetaminophen  (Tylenol ), topical muscle creams such as SalonPas, [employer]-mogul, Bengay, etc. Please stretch, apply ice or heat (whichever helps), and have massage therapy for additional assistance.  Avoid activities that may worsen pain.  Use crutches if needed for comfort.  May apply elastic bandage if comfortable.  Elevate leg above heart level when resting to reduce swelling.  For pain control you may take 1000 mg of acetaminophen  (Tylenol ) every 8 hours with or without either 600 mg of Ibuprofen (Motrin, Advil, etc.) every 6-8 hours or 220 mg of Naproxen (Aleve) every 12 hours as needed.  Please limit 24 hr usage of the following medicines: - Acetaminophen  (Tylenol ) to 4000 mg - Ibuprofen (Motrin, Advil, etc.) to 2400 mg - Naproxen (Aleve) to 880 mg    Please note that other over-the-counter medicine may contain acetaminophen  or ibuprofen as a component of their ingredients.   Follow-up with primary care doctor for further evaluation.  Return for worsening pain, swelling, redness, fevers, vomiting, shortness of breath, chest pain, numbness or tingling.

## 2024-10-26 NOTE — ED Provider Notes (Signed)
 " [place] [facility] Provider Note   CSN: [id number] Arrival date & time: 10/26/24  1721     Patient presents with: Leg Pain   Joanna Wright is a 54 y.o. female.  presents to the ED for evaluation of left calf pain after a fall.  Patient slipped on ice around 11 AM this morning.  Did not hit head.  Denies LOC.  Was able to ambulate but experienced pain in the left lower leg.  Did not note any swelling or redness of the area.  Took ibuprofen this morning without relief.  Denies any numbness or tingling.  Not on blood thinner. Denies knee pain, ankle pain, wrist pain, back pain, fever, cough, chills, chest pain, shortness of breath, nausea, vomiting, abdominal pain, diarrhea, weakness, numbness or tingling, headache, dizziness, or any other symptoms at this time.  History of surgery of the left lower leg.      Leg Pain      Prior to Admission medications  Medication Sig Start Date End Date Taking? Authorizing Provider  amLODipine  (NORVASC ) 10 MG tablet Take 1 tablet (10 mg total) by mouth daily. 05/17/24   O'Sullivan, Melissa, NP  Cholecalciferol (VITAMIN D3) 75 MCG (3000 UT) TABS Take 1 tablet by mouth daily at 6 (six) AM. 03/26/24   Daryl Setter, NP  norethindrone  (INCASSIA ) 0.35 MG tablet Take 1 tablet (0.35 mg total) by mouth daily. 06/14/24   O'Sullivan, Melissa, NP  Vitamin D , Ergocalciferol , (DRISDOL ) 1.25 MG (50000 UNIT) CAPS capsule Take 50,000 Units by mouth once a week. 05/14/24   [provider]    Allergies: Patient has no known allergies.    Review of Systems  Updated Vital Signs BP 126/87   Pulse 82   Temp 98.2 F (36.8 C) (Oral)   Resp 16   LMP 05/16/2021   SpO2 99%   Physical Exam Vitals and nursing note reviewed.  Constitutional:      General: She is not in acute distress.    Appearance: She is well-developed.  HENT:     Head: Normocephalic and atraumatic.  Eyes:     Conjunctiva/sclera: Conjunctivae  normal.  Cardiovascular:     Rate and Rhythm: Normal rate and regular rhythm.     Heart sounds: No murmur heard. Pulmonary:     Effort: Pulmonary effort is normal. No respiratory distress.     Breath sounds: Normal breath sounds. No stridor. No wheezing, rhonchi or rales.  Chest:     Chest wall: No tenderness.  Abdominal:     Palpations: Abdomen is soft.     Tenderness: There is no abdominal tenderness.  Musculoskeletal:        General: No swelling.     Right shoulder: Normal.     Left shoulder: Normal.     Right upper arm: Normal.     Left upper arm: Normal.     Right elbow: Normal.     Left elbow: Normal.     Right forearm: Normal.     Left forearm: Normal.     Right wrist: Normal.     Left wrist: Normal.     Cervical back: Normal and neck supple.     Thoracic back: Normal.     Lumbar back: Normal.     Right hip: Normal.     Left hip: Normal.     Right upper leg: Normal.     Left upper leg: Normal.     Right knee: Normal.  Left knee: Normal.     Right lower leg: Normal.     Left lower leg: Tenderness present. No swelling, deformity, lacerations or bony tenderness. No edema.     Right ankle: Normal.     Left ankle: Normal.     Right foot: Normal.     Left foot: Normal.     Comments: Mild tenderness to the left calf without any signs of ecchymosis, swelling, erythema.  Sensation intact.  Distal pulses palpable bilaterally.  Normal gait.  Normal range of motion and 5 out of 5 muscle strength.  Skin:    General: Skin is warm and dry.     Capillary Refill: Capillary refill takes less than 2 seconds.  Neurological:     General: No focal deficit present.     Mental Status: She is alert and oriented to person, place, and time.     Sensory: Sensation is intact.     Coordination: Coordination is intact.     Gait: Gait is intact.  Psychiatric:        Mood and Affect: Mood normal.     (all labs ordered are listed, but only abnormal results are displayed) Labs Reviewed  - No data to display  EKG: None  Radiology: DG Tibia/Fibula Left Result Date: 10/26/2024 EXAM: 2 VIEW(S) XRAY OF THE LEFT TIBIA AND FIBULA 10/26/2024 06:06:00 PM COMPARISON: None available. CLINICAL HISTORY: Leg pain. FINDINGS: BONES AND JOINTS: Plate and screw fixation of the distal tibia and fibula in place. No fracture or dislocation. SOFT TISSUES: Unremarkable. IMPRESSION: 1. No acute osseous abnormality. 2. Plate and screw fixation of the distal tibia and fibula. Electronically signed by: Norman Gatlin MD 10/26/2024 06:18 PM EST RP Workstation: [id number]     Procedures   Medications Ordered in the ED  ketorolac  (TORADOL ) 15 MG/ML injection 15 mg (15 mg Intramuscular Given 10/26/24 2010)                                    Medical Decision Making Amount and/or Complexity of Data Reviewed Radiology: ordered.  Risk Prescription drug management.   Patient is a 54 year old female presenting today for left leg pain after slipping on ice.  Denies head injury.  Denies LOC.  Patient is able to walk but is painful. Patient is alert and oriented with no apparent distress.  Well-appearing and appears comfortable on exam.  Mild tenderness to palpation of the left calf with no signs of ecchymosis, swelling, erythema.  Sensation intact.  Distal pulses palpable bilaterally.  Normal gait.  Normal range of motion and 5 out of 5 muscle strength.  No tenderness to palpation of other extremities.  Abdomen soft nontender.  Lungs clear.  Heart normal.  Vital signs stable.    X-ray did not reveal any acute fractures or dislocation.  Discussed this finding with the patient.  I suspect this is more likely a strain.  Recommended patient to apply ice and take ibuprofen/Tylenol  as needed for pain.  Toradol  was provided here and patient reported improvement.  Recommended patient to bear weight as tolerated and wrap the area with a elastic bandage as needed.  Hemodynamically stable.  Ambulatory before discharge.   Return precautions provided.  Patient is in agreement with plan and is stable for discharge.        Final diagnoses:  Left leg pain    ED Discharge Orders     None  Braxton Dubois, PA-C 10/26/24 2148    Simon Lavonia SAILOR, MD 10/26/24 2243  "

## 2024-12-28 ENCOUNTER — Ambulatory Visit: Admitting: Family
